# Patient Record
Sex: Male | Born: 1950 | Race: White | Hispanic: No | Marital: Married | Smoking: Former smoker
Health system: Southern US, Community
[De-identification: ages and names within clinical notes are randomized; demographics above are authoritative.]

## PROBLEM LIST (undated history)

## (undated) DIAGNOSIS — H938X1 Other specified disorders of right ear: Secondary | ICD-10-CM

## (undated) DIAGNOSIS — J309 Allergic rhinitis, unspecified: Secondary | ICD-10-CM

## (undated) DIAGNOSIS — H02231 Paralytic lagophthalmos right upper eyelid: Secondary | ICD-10-CM

## (undated) DIAGNOSIS — R894 Abnormal immunological findings in specimens from other organs, systems and tissues: Secondary | ICD-10-CM

## (undated) DIAGNOSIS — E785 Hyperlipidemia, unspecified: Secondary | ICD-10-CM

## (undated) DIAGNOSIS — K649 Unspecified hemorrhoids: Secondary | ICD-10-CM

## (undated) DIAGNOSIS — R1312 Dysphagia, oropharyngeal phase: Secondary | ICD-10-CM

## (undated) DIAGNOSIS — D131 Benign neoplasm of stomach: Secondary | ICD-10-CM

## (undated) DIAGNOSIS — G519 Disorder of facial nerve, unspecified: Principal | ICD-10-CM

## (undated) DIAGNOSIS — C07 Malignant neoplasm of parotid gland: Secondary | ICD-10-CM

## (undated) DIAGNOSIS — K2980 Duodenitis without bleeding: Secondary | ICD-10-CM

## (undated) DIAGNOSIS — Z833 Family history of diabetes mellitus: Secondary | ICD-10-CM

## (undated) DIAGNOSIS — G510 Bell's palsy: Secondary | ICD-10-CM

## (undated) DIAGNOSIS — H00019 Hordeolum externum unspecified eye, unspecified eyelid: Secondary | ICD-10-CM

## (undated) DIAGNOSIS — M25569 Pain in unspecified knee: Secondary | ICD-10-CM

## (undated) DIAGNOSIS — B159 Hepatitis A without hepatic coma: Secondary | ICD-10-CM

## (undated) DIAGNOSIS — K219 Gastro-esophageal reflux disease without esophagitis: Secondary | ICD-10-CM

## (undated) DIAGNOSIS — H903 Sensorineural hearing loss, bilateral: Secondary | ICD-10-CM

## (undated) DIAGNOSIS — C089 Malignant neoplasm of major salivary gland, unspecified: Secondary | ICD-10-CM

## (undated) DIAGNOSIS — M1990 Unspecified osteoarthritis, unspecified site: Secondary | ICD-10-CM

## (undated) DIAGNOSIS — T7840XA Allergy, unspecified, initial encounter: Secondary | ICD-10-CM

## (undated) HISTORY — DX: Unspecified osteoarthritis, unspecified site: M19.90

## (undated) HISTORY — DX: Benign neoplasm of stomach: D13.1

## (undated) HISTORY — DX: Bell's palsy: G51.0

## (undated) HISTORY — DX: Allergy, unspecified, initial encounter: T78.40XA

## (undated) HISTORY — DX: Unspecified hemorrhoids: K64.9

## (undated) HISTORY — DX: Disorder of facial nerve, unspecified: G51.9

## (undated) HISTORY — DX: Hepatitis a without hepatic coma: B15.9

## (undated) HISTORY — DX: Hyperlipidemia, unspecified: E78.5

## (undated) HISTORY — PX: WISDOM TOOTH EXTRACTION: SHX21

## (undated) HISTORY — DX: Allergic rhinitis, unspecified: J30.9

## (undated) HISTORY — PX: SALIVARY GLAND SURGERY: SHX768

## (undated) HISTORY — PX: NASAL SEPTUM SURGERY: SHX37

## (undated) HISTORY — DX: Duodenitis without bleeding: K29.80

## (undated) HISTORY — DX: Gastro-esophageal reflux disease without esophagitis: K21.9

## (undated) HISTORY — PX: OTHER SURGICAL HISTORY: SHX169

---
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
## 1977-12-24 HISTORY — PX: NASAL SEPTUM SURGERY: SHX37

## 1994-12-24 DIAGNOSIS — Z5189 Encounter for other specified aftercare: Secondary | ICD-10-CM

## 1994-12-24 HISTORY — PX: CHEST SURGERY: SHX595

## 1994-12-24 HISTORY — PX: OTHER SURGICAL HISTORY: SHX169

## 1994-12-24 HISTORY — DX: Encounter for other specified aftercare: Z51.89

## 1994-12-24 HISTORY — PX: HAND SURGERY: SHX662

## 2007-12-25 LAB — HM COLONOSCOPY

## 2008-11-26 LAB — HM COLONOSCOPY

## 2012-12-11 ENCOUNTER — Encounter: Payer: Self-pay | Admitting: Gastroenterology

## 2013-01-27 ENCOUNTER — Encounter: Payer: Self-pay | Admitting: Gastroenterology

## 2013-01-27 ENCOUNTER — Ambulatory Visit (INDEPENDENT_AMBULATORY_CARE_PROVIDER_SITE_OTHER): Payer: 59 | Admitting: Gastroenterology

## 2013-01-27 VITALS — BP 110/88 | HR 84 | Ht 70.25 in | Wt 210.2 lb

## 2013-01-27 DIAGNOSIS — K3189 Other diseases of stomach and duodenum: Secondary | ICD-10-CM

## 2013-01-27 DIAGNOSIS — K219 Gastro-esophageal reflux disease without esophagitis: Secondary | ICD-10-CM

## 2013-01-27 DIAGNOSIS — R1013 Epigastric pain: Secondary | ICD-10-CM

## 2013-01-27 MED ORDER — OMEPRAZOLE 40 MG PO CPDR: 40.0000 mg | DELAYED_RELEASE_CAPSULE | Freq: Every day | ORAL | Status: DC

## 2013-01-27 NOTE — Progress Notes (Signed)
HPI: This is a  very pleasant 62 year old man whom I am meeting for the first time today.  EGD 11/2008 Dr. Sande Brothers in [place]: done for GERD, found duodenitis, mild gastritis, nodular mucosa in body; biopsies showed fundic gland polyps. Colonoscopy 2009: for screening; no polyps found, recommended repeat at 10 year interval.  Had 'bleeding ulcer' in stomach in 1994, required endoscopic treatment.  He tells me he gets an EGD about every 5 years 'just to check' for more ulcers.  Has been taking PPI for many years; currently taking omeprazole 20mg  once a day. This is not covering.  Pyrosis, pressure.  Needs to cover with Tums 2-3 times per week.  Usually takes omeprazole 15-20 min prior to BF meal. Has pyrosis at night often, laying down especially.  Laying down is worse for him.    Normally eats dinner, then has a snack   Non smoker, rare etoh. 2 mugs of coffee a day.  Moved from [place] in 2010 to be closer to her daughters.    Review of systems: Pertinent positive and negative review of systems were noted in the above HPI section. Complete review of systems was performed and was otherwise normal.    Past Medical History  Diagnosis Date  . GERD (gastroesophageal reflux disease)   . Hepatitis A   . HLD (hyperlipidemia)   . Hemorrhoids   . Duodenitis   . Fundic gland polyps of stomach, benign     Past Surgical History  Procedure Date  . Arm surgery     left, ligaments and tendon repair, accident  . Nasal septum surgery     Current Outpatient Prescriptions  Medication Sig Dispense Refill  . omeprazole (PRILOSEC) 20 MG capsule Take 20 mg by mouth daily.      . simvastatin (ZOCOR) 10 MG tablet Take 10 mg by mouth daily.        Allergies as of 01/27/2013  . (No Known Allergies)    Family History  Problem Relation Age of Onset  . Prostate cancer Father   . Colon polyps Mother   . Diabetes Mother   . Parkinson's disease Mother     History   Social History  .  Marital Status: Married    Spouse Name: N/A    Number of Children: 3  . Years of Education: N/A   Occupational History  . electrician    Social History Main Topics  . Smoking status: Former Smoker -- 5 years    Types: Cigarettes    Quit date: 12/24/1982  . Smokeless tobacco: Never Used  . Alcohol Use: Yes     Comment: social  . Drug Use: No  . Sexually Active: Not on file   Other Topics Concern  . Not on file   Social History Narrative  . No narrative on file       Physical Exam: BP 110/88  Pulse 84  Ht 5' 10.25" (1.784 m)  Wt 210 lb 4 oz (95.369 kg)  BMI 29.95 kg/m2 Constitutional: generally well-appearing Psychiatric: alert and oriented x3 Eyes: extraocular movements intact Mouth: oral pharynx moist, no lesions Neck: supple no lymphadenopathy Cardiovascular: heart regular rate and rhythm Lungs: clear to auscultation bilaterally Abdomen: soft, nontender, nondistended, no obvious ascites, no peritoneal signs, normal bowel sounds Extremities: no lower extremity edema bilaterally Skin: no lesions on visible extremities    Assessment and plan: 62 y.o. male with  routine risk for colon cancer, chronic GERD that is worsening despite his usual medicines.  We'll  proceed with EGD at his soonest convenience. I am changing his proton pump inhibitor around 240 mg daily omeprazole and he will consider adding H2 blocker at night if needed in one to 2 weeks. Will take over his colon cancer screening here with recall colonoscopy at 10 year interval from his first one.

## 2013-01-27 NOTE — Patient Instructions (Addendum)
You will be set up for an upper endoscopy for GERD, dyspepsia despite usual medicines. New script for omeprazole 40mg  once daily shortly before breakfast meal. If that doesn't help the PM symptoms then start taking Pepcid (or zantac) one pill at bedtime every night (particularly good for PM, laying down symptoms of acid). Recall colonoscopy for routine risk in December 2019.

## 2013-01-28 ENCOUNTER — Encounter: Payer: Self-pay | Admitting: Gastroenterology

## 2013-02-02 ENCOUNTER — Encounter: Payer: Self-pay | Admitting: Gastroenterology

## 2013-02-02 ENCOUNTER — Ambulatory Visit (AMBULATORY_SURGERY_CENTER): Payer: 59 | Admitting: Gastroenterology

## 2013-02-02 VITALS — BP 108/75 | HR 72 | Temp 97.5°F | Resp 18 | Ht 69.5 in | Wt 210.0 lb

## 2013-02-02 DIAGNOSIS — K219 Gastro-esophageal reflux disease without esophagitis: Secondary | ICD-10-CM

## 2013-02-02 DIAGNOSIS — K2970 Gastritis, unspecified, without bleeding: Secondary | ICD-10-CM

## 2013-02-02 DIAGNOSIS — K3189 Other diseases of stomach and duodenum: Secondary | ICD-10-CM

## 2013-02-02 DIAGNOSIS — K319 Disease of stomach and duodenum, unspecified: Secondary | ICD-10-CM

## 2013-02-02 MED ORDER — SODIUM CHLORIDE 0.9 % IV SOLN: 500.0000 mL | INTRAVENOUS | Status: DC

## 2013-02-02 NOTE — Op Note (Signed)
[facility] [address] [place], [address]   ENDOSCOPY PROCEDURE REPORT  PATIENT: Gregory Santiago, Gregory Santiago  MR#: [id number] BIRTHDATE: April 15, 1951 , 61  yrs. old GENDER: Male ENDOSCOPIST: Rachael Fee, MD PROCEDURE DATE:  02/02/2013 PROCEDURE:  EGD w/ biopsy ASA CLASS:     Class II INDICATIONS:  GERD, dyspepsia despite usual PPI. MEDICATIONS: Fentanyl 50 mcg IV, Versed 4 mg IV, and These medications were titrated to patient response per physician's verbal order TOPICAL ANESTHETIC: Cetacaine Spray  DESCRIPTION OF PROCEDURE: After the risks benefits and alternatives of the procedure were thoroughly explained, informed consent was obtained.  The LB GIF-H180 [id number] endoscope was introduced through the mouth and advanced to the second portion of the duodenum. Without limitations.  The instrument was slowly withdrawn as the mucosa was fully examined.    There was mild, non-specific distal gastritis.  Biopsies were taken and sent to pathology.  The examination was otherwise normal. Retroflexed views revealed no abnormalities.     The scope was then withdrawn from the patient and the procedure completed. COMPLICATIONS: There were no complications.  ENDOSCOPIC IMPRESSION: There was mild, non-specific distal gastritis, biopsied to check for H. pylori The examination was otherwise normal.  RECOMMENDATIONS: Await biopsy results   eSigned:  Rachael Fee, MD 02/02/2013 3:50 PM

## 2013-02-02 NOTE — Progress Notes (Signed)
Patient did not experience any of the following events: a burn prior to discharge; a fall within the facility; wrong site/side/patient/procedure/implant event; or a hospital transfer or hospital admission upon discharge from the facility. (G8907) Patient did not have preoperative order for IV antibiotic SSI prophylaxis. (G8918)  

## 2013-02-02 NOTE — Patient Instructions (Addendum)
YOU HAD AN ENDOSCOPIC PROCEDURE TODAY AT THE [facility]: Refer to the procedure report that was given to you for any specific questions about what was found during the examination.  If the procedure report does not answer your questions, please call your gastroenterologist to clarify.  If you requested that your care partner not be given the details of your procedure findings, then the procedure report has been included in a sealed envelope for you to review at your convenience later.  YOU SHOULD EXPECT: Some feelings of bloating in the abdomen. Passage of more gas than usual.  Walking can help get rid of the air that was put into your GI tract during the procedure and reduce the bloating. If you had a lower endoscopy (such as a colonoscopy or flexible sigmoidoscopy) you may notice spotting of blood in your stool or on the toilet paper. If you underwent a bowel prep for your procedure, then you may not have a normal bowel movement for a few days.  DIET: Your first meal following the procedure should be a light meal and then it is ok to progress to your normal diet.  A half-sandwich or bowl of soup is an example of a good first meal.  Heavy or fried foods are harder to digest and may make you feel nauseous or bloated.  Likewise meals heavy in dairy and vegetables can cause extra gas to form and this can also increase the bloating.  Drink plenty of fluids but you should avoid alcoholic beverages for 24 hours.  ACTIVITY: Your care partner should take you home directly after the procedure.  You should plan to take it easy, moving slowly for the rest of the day.  You can resume normal activity the day after the procedure however you should NOT DRIVE or use heavy machinery for 24 hours (because of the sedation medicines used during the test).    SYMPTOMS TO REPORT IMMEDIATELY: A gastroenterologist can be reached at any hour.  During normal business hours, 8:30 AM to 5:00 PM Monday through Friday,  call [phone].  After hours and on weekends, please call the GI answering service at [phone] who will take a message and have the physician on call contact you.    Following upper endoscopy (EGD)  Vomiting of blood or coffee ground material  New chest pain or pain under the shoulder blades  Painful or persistently difficult swallowing  New shortness of breath  Fever of 100F or higher  Black, tarry-looking stools  FOLLOW UP: If any biopsies were taken you will be contacted by phone or by letter within the next 1-3 weeks.  Call your gastroenterologist if you have not heard about the biopsies in 3 weeks.  Our staff will call the home number listed on your records the next business day following your procedure to check on you and address any questions or concerns that you may have at that time regarding the information given to you following your procedure. This is a courtesy call and so if there is no answer at the home number and we have not heard from you through the emergency physician on call, we will assume that you have returned to your regular daily activities without incident.  SIGNATURES/CONFIDENTIALITY: You and/or your care partner have signed paperwork which will be entered into your electronic medical record.  These signatures attest to the fact that that the information above on your After Visit Summary has been reviewed and is understood.  Full   responsibility of the confidentiality of this discharge information lies with you and/or your care-partner.  gastrititis information given.  Dr. Christella Hartigan will advise you of results of biopsy for H-pylori and if treatment is necessary.

## 2013-02-03 ENCOUNTER — Telehealth: Payer: Self-pay | Admitting: *Deleted

## 2013-02-03 NOTE — Telephone Encounter (Signed)
  Follow up Call-  Call back number 02/02/2013  Post procedure Call Back phone  # [phone]  Permission to leave phone message Yes     Patient questions:  Do you have a fever, pain , or abdominal swelling? no Pain Score  0 *  Have you tolerated food without any problems? yes  Have you been able to return to your normal activities? yes  Do you have any questions about your discharge instructions: Diet   no Medications  no Follow up visit  no  Do you have questions or concerns about your Care? no  Actions: * If pain score is 4 or above: No action needed, pain <4.

## 2013-02-11 ENCOUNTER — Encounter: Payer: Self-pay | Admitting: Gastroenterology

## 2013-10-29 ENCOUNTER — Other Ambulatory Visit: Payer: Self-pay

## 2013-11-28 ENCOUNTER — Emergency Department (HOSPITAL_BASED_OUTPATIENT_CLINIC_OR_DEPARTMENT_OTHER)
Admission: EM | Admit: 2013-11-28 | Discharge: 2013-11-28 | Disposition: A | Discharge status: Home / Self Care | Payer: 59 | Attending: Emergency Medicine | Admitting: Emergency Medicine

## 2013-11-28 ENCOUNTER — Encounter (HOSPITAL_BASED_OUTPATIENT_CLINIC_OR_DEPARTMENT_OTHER): Payer: Self-pay | Admitting: Emergency Medicine

## 2013-11-28 DIAGNOSIS — Z8619 Personal history of other infectious and parasitic diseases: Secondary | ICD-10-CM | POA: Insufficient documentation

## 2013-11-28 DIAGNOSIS — Z87891 Personal history of nicotine dependence: Secondary | ICD-10-CM | POA: Insufficient documentation

## 2013-11-28 DIAGNOSIS — Z23 Encounter for immunization: Secondary | ICD-10-CM | POA: Insufficient documentation

## 2013-11-28 DIAGNOSIS — Z8679 Personal history of other diseases of the circulatory system: Secondary | ICD-10-CM | POA: Insufficient documentation

## 2013-11-28 DIAGNOSIS — E785 Hyperlipidemia, unspecified: Secondary | ICD-10-CM | POA: Insufficient documentation

## 2013-11-28 DIAGNOSIS — Y929 Unspecified place or not applicable: Secondary | ICD-10-CM | POA: Insufficient documentation

## 2013-11-28 DIAGNOSIS — S61209A Unspecified open wound of unspecified finger without damage to nail, initial encounter: Secondary | ICD-10-CM | POA: Insufficient documentation

## 2013-11-28 DIAGNOSIS — Y939 Activity, unspecified: Secondary | ICD-10-CM | POA: Insufficient documentation

## 2013-11-28 DIAGNOSIS — S61012A Laceration without foreign body of left thumb without damage to nail, initial encounter: Secondary | ICD-10-CM

## 2013-11-28 DIAGNOSIS — K219 Gastro-esophageal reflux disease without esophagitis: Secondary | ICD-10-CM | POA: Insufficient documentation

## 2013-11-28 DIAGNOSIS — W298XXA Contact with other powered powered hand tools and household machinery, initial encounter: Secondary | ICD-10-CM | POA: Insufficient documentation

## 2013-11-28 DIAGNOSIS — Z79899 Other long term (current) drug therapy: Secondary | ICD-10-CM | POA: Insufficient documentation

## 2013-11-28 MED ORDER — CEPHALEXIN 250 MG PO CAPS
1000.0000 mg | ORAL_CAPSULE | Freq: Once | ORAL | Status: AC
  Administered 2013-11-28: 1000 mg via ORAL
  Filled 2013-11-28: qty 4

## 2013-11-28 MED ORDER — TETANUS-DIPHTH-ACELL PERTUSSIS 5-2.5-18.5 LF-MCG/0.5 IM SUSP
0.5000 mL | Freq: Once | INTRAMUSCULAR | Status: AC
  Administered 2013-11-28: 0.5 mL via INTRAMUSCULAR
  Filled 2013-11-28: qty 0.5

## 2013-11-28 NOTE — ED Provider Notes (Signed)
CSN: [id number]     Arrival date & time 11/28/13  2111 History  This chart was scribed for Hanley Seamen, MD by Bennett Scrape, ED Scribe. This patient was seen in room MH09/MH09 and the patient's care was started at 10:53 PM.   Chief Complaint  Patient presents with  . Laceration   The history is provided by the patient. No language interpreter was used.    HPI Comments: Catalino Plascencia is a 62 y.o. male who presents to the Emergency Department complaining of a laceration to the left thumb. Pt states that he cut it with a circular saw around 5 PM tonight. He bandaged the area and continued working. He reports that he is here currently due to his wife's concerns over the injury. The bleeding is controlled currently and he denies numbness or weakness of the left thumb. He denies any other complaints. TD vaccine is UTD. Pain is minimal.   Past Medical History  Diagnosis Date  . GERD (gastroesophageal reflux disease)   . Hepatitis A   . HLD (hyperlipidemia)   . Hemorrhoids   . Duodenitis   . Fundic gland polyps of stomach, benign    Past Surgical History  Procedure Laterality Date  . Arm surgery      left, ligaments and tendon repair, accident  . Nasal septum surgery     Family History  Problem Relation Age of Onset  . Prostate cancer Father   . Colon polyps Mother   . Diabetes Mother   . Parkinson's disease Mother    History  Substance Use Topics  . Smoking status: Former Smoker -- 5 years    Types: Cigarettes    Quit date: 12/24/1982  . Smokeless tobacco: Never Used  . Alcohol Use: Yes     Comment: social    Review of Systems  A complete 10 system review of systems was obtained and all systems are negative except as noted in the HPI and PMH.   Allergies  Review of patient's allergies indicates no known allergies.  Home Medications   Current Outpatient Rx  Name  Route  Sig  Dispense  Refill  . omeprazole (PRILOSEC) 40 MG capsule   Oral   Take 1 capsule (40 mg  total) by mouth daily.   30 capsule   11   . simvastatin (ZOCOR) 10 MG tablet   Oral   Take 10 mg by mouth daily.          Triage Vitals: BP 127/86  Pulse 74  Temp(Src) 98.4 F (36.9 C) (Oral)  Resp 16  Ht 5\' 11"  (1.803 m)  Wt 200 lb (90.719 kg)  BMI 27.91 kg/m2  SpO2 98%  Physical Exam  Nursing note and vitals reviewed.  General: Well-developed, well-nourished male in no acute distress; appearance consistent with age of record HENT: normocephalic; atraumatic Eyes: pupils equal, round and reactive to light; extraocular muscles intact Neck: supple Heart: regular rate and rhythm; no murmurs, rubs or gallops Lungs: clear to auscultation bilaterally Abdomen: soft; nondistended; nontender; no masses or hepatosplenomegaly; bowel sounds present Extremities: 1.7 cm laceration to the medial aspect of the left thumb proximal phalanx. It is at angle and does not penetrate the full thickness of the skin. There are no functional or sensory deficits associated with it, full range of motion; pulses normal, no edema, no deformity Neurologic: Awake, alert and oriented; motor function intact in all extremities and symmetric; no facial droop Skin: Warm and dry Psychiatric: Normal mood and affect  ED Course  Procedures (including critical care time)  DIAGNOSTIC STUDIES: Oxygen Saturation is 98% on room air, normal by my interpretation.    COORDINATION OF CARE: 10:57 PM-Discussed treatment plan which includes laceration repair with pt at bedside and pt agreed to plan. Will give 1 g of peri-procedural Keflex.   LACERATION REPAIR PROCEDURE NOTE The patient's identification was confirmed and consent was obtained. This procedure was performed by Hanley Seamen, MD at 10:59 PM. Site: medial aspect of the left thumb Sterile procedures observed Anesthetic used (type and amt): 1.5 mL of 2% lidocaine without epi Suture type/size: 5-0 Nylon Length: 1.7 cm # of Sutures: 3 Technique:  Interrupted Complexity: Simple Antibx ointment applied Tetanus UTD Site anesthetized, irrigated with NS, explored without evidence of foreign body, wound well approximated, site covered with dry, sterile dressing.  Patient tolerated procedure well without complications. Instructions for care discussed verbally and patient provided with additional written instructions for homecare and f/u.   MDM   1. Laceration of left thumb without complication    I personally performed the services described in this documentation, which was scribed in my presence.  The recorded information has been reviewed and is accurate.      Hanley Seamen, MD 11/28/13 [phone]

## 2013-11-28 NOTE — ED Notes (Signed)
Laceration left thumb webbing caused by saw

## 2013-11-28 NOTE — ED Notes (Signed)
I have patient soaking thumb in 50/50 solution of saline and iodine, patient waiting/ready for procedure.

## 2013-11-28 NOTE — ED Notes (Signed)
Wound left thumb webbing bleeding control DSD applied after cleaning with NSS

## 2013-11-28 NOTE — ED Notes (Signed)
D/c with family- no rx given

## 2013-12-08 ENCOUNTER — Ambulatory Visit: Payer: 59 | Admitting: Internal Medicine

## 2013-12-09 ENCOUNTER — Ambulatory Visit (INDEPENDENT_AMBULATORY_CARE_PROVIDER_SITE_OTHER): Payer: 59 | Admitting: Internal Medicine

## 2013-12-09 ENCOUNTER — Encounter: Payer: Self-pay | Admitting: Internal Medicine

## 2013-12-09 VITALS — BP 128/84 | HR 72 | Temp 98.1°F | Ht 71.0 in | Wt 205.0 lb

## 2013-12-09 DIAGNOSIS — Z23 Encounter for immunization: Secondary | ICD-10-CM

## 2013-12-09 DIAGNOSIS — E785 Hyperlipidemia, unspecified: Secondary | ICD-10-CM

## 2013-12-09 DIAGNOSIS — S61012S Laceration without foreign body of left thumb without damage to nail, sequela: Secondary | ICD-10-CM

## 2013-12-09 DIAGNOSIS — L089 Local infection of the skin and subcutaneous tissue, unspecified: Secondary | ICD-10-CM

## 2013-12-09 DIAGNOSIS — IMO0002 Reserved for concepts with insufficient information to code with codable children: Secondary | ICD-10-CM

## 2013-12-09 DIAGNOSIS — K219 Gastro-esophageal reflux disease without esophagitis: Secondary | ICD-10-CM | POA: Insufficient documentation

## 2013-12-09 MED ORDER — SULFAMETHOXAZOLE-TRIMETHOPRIM 800-160 MG PO TABS: 1.0000 | ORAL_TABLET | Freq: Two times a day (BID) | ORAL | Status: DC

## 2013-12-09 NOTE — Progress Notes (Signed)
Pre-visit discussion using our clinic review tool. No additional management support is needed unless otherwise documented below in the visit note.  

## 2013-12-09 NOTE — Assessment & Plan Note (Signed)
On statin Check lipids and adjust as needed -scheduled for February 2015

## 2013-12-09 NOTE — Assessment & Plan Note (Signed)
Prior GI evaluation 2012 reviewed Symptoms controlled with once daily PPI The current medical regimen is effective;  continue present plan and medications.

## 2013-12-09 NOTE — Progress Notes (Signed)
   Subjective:    Patient ID: Gregory Santiago, male    DOB: 01/04/1951, 62 y.o.   MRN: [id number]  Suture / Staple Removal   New patient to me - here to establish care - CPX scheduled for 01/2014 Here for suture removal - laceration to L thumb base 11/28/13 - seen in ER - sutures placed In past 2 days, increasing erythema with redness and drainage  Also reviewed chronic medical issues: Allergic rhinitis. Using over-the-counter nasal steroid and antihistamine for treatment of same. Denies seasonal flare, worse since moving to [place] from [place]. No sinus pressure or fever  Dyslipidemia. On statin. Due for annual labs in the spring. Denies changes of medication dose and her reports compliance as prescribed.  GERD. On PPI. Denies abdominal pain, nausea, unexpected weight loss or bowel change  Past Medical History  Diagnosis Date  . GERD (gastroesophageal reflux disease)   . Hepatitis A   . HLD (hyperlipidemia)   . Hemorrhoids   . Duodenitis   . Fundic gland polyps of stomach, benign   . Headaches, cluster     Review of Systems  Constitutional: Negative for fever, fatigue and unexpected weight change.  Respiratory: Negative for cough and shortness of breath.   Cardiovascular: Negative for chest pain and leg swelling.  Musculoskeletal: Negative for arthralgias and joint swelling.       Objective:   Physical Exam BP 128/84  Pulse 72  Temp(Src) 98.1 F (36.7 C) (Oral)  Ht 5\' 11"  (1.803 m)  Wt 205 lb (92.987 kg)  BMI 28.60 kg/m2  SpO2 95% Wt Readings from Last 3 Encounters:  12/09/13 205 lb (92.987 kg)  11/28/13 200 lb (90.719 kg)  02/02/13 210 lb (95.255 kg)   Constitutional: he appears well-developed and well-nourished. No distress.  Neck: Normal range of motion. Neck supple. No JVD present. No thyromegaly present.  Cardiovascular: Normal rate, regular rhythm and normal heart sounds.  No murmur heard. No BLE edema. Pulmonary/Chest: Effort normal and breath sounds  normal. No respiratory distress. he has no wheezes.  Musculoskeletal: left thumb with mild erythema and white/bruised discolration at sutures (3 across 2cm laceration at lateral edge of thumb base) - no purulence expressed and sutures removed without problem. Thumb with normal range of motion, no joint effusions. No gross deformities - Neurovascular intact distally Skin: L thumb laceration - see MSkel above -remaining skin is warm and dry. No rash noted. No erythema.  Psychiatric: he has a normal mood and affect. behavior is normal. Judgment and thought content normal.  No results found for this basename: WBC, HGB, HCT, PLT, GLUCOSE, CHOL, TRIG, HDL, LDLDIRECT, LDLCALC, ALT, AST, NA, K, CL, CREATININE, BUN, CO2, TSH, PSA, INR, GLUF, HGBA1C, MICROALBUR       Assessment & Plan:   Laceration left thumb with early infection  Sutures removed today Septra twice daily for one week Soaking and wound care instructions provided Pt agrees to call if symptoms worse or unimproved with conservative therapy for referral to hand as needed

## 2013-12-09 NOTE — Patient Instructions (Addendum)
It was good to see you today.  We have removed sutures from your thumb today  Septra antibiotics 2x/day x 1 week - Your prescription(s) have been submitted to your pharmacy. Please take as directed and contact our office if you believe you are having problem(s) with the medication(s).  Continue to soak 3x/day for 5-8 minutes in warm soapy watery for next several days- call if increase in red, pain and drainage or if any fever  Hepatitis B vaccination #1 of 3 given today  Keep follow up as planned for annual exam and labs, please call sooner if any problems  Wound Infection A wound infection happens when a type of germ (bacteria) grows in a wound. Caring for the infection can help the wound heal. Wound infections need treatment. HOME CARE   Only take medicine as told by your doctor.  Take your antibiotic medicine as told. Finish it even if you start to feel better.  Clean the wound with mild soap and water as told. Rinse the soap off. Pat the area dry with a clean towel. Do not rub the wound.  Change any bandages (dressings) as told by your doctor.  Put cream and a bandage on the wound as told by your doctor.  If the bandage sticks, wet it with soapy water to remove the bandage.  Change the bandage if it gets wet, dirty, or starts to smell.  Take showers. Do not take baths, swim, or do anything that puts your wound under water.  Avoid exercise that makes you sweat.  If your wound itches, use a medicine that helps stop itching. Do not pick or scratch at the wound.  Keep all doctor visits as told. GET HELP RIGHT AWAY IF:   You have more puffiness (swelling), pain, or redness around the wound.  You have more yellowish-white fluid (pus) coming from the wound.  You have a bad smell coming from the wound.  Your wound breaks open more.  You have a fever. MAKE SURE YOU:   Understand these instructions.  Will watch your condition.  Will get help right away if you are not  doing well or get worse. Document Released: 09/18/2008 Document Revised: 03/03/2012 Document Reviewed: 05/21/2011 [facility] Patient Information 2014 [place], [place].

## 2014-01-29 ENCOUNTER — Encounter: Payer: Self-pay | Admitting: Internal Medicine

## 2014-01-29 ENCOUNTER — Ambulatory Visit (INDEPENDENT_AMBULATORY_CARE_PROVIDER_SITE_OTHER): Payer: 59 | Admitting: Internal Medicine

## 2014-01-29 ENCOUNTER — Ambulatory Visit (INDEPENDENT_AMBULATORY_CARE_PROVIDER_SITE_OTHER): Payer: 59

## 2014-01-29 VITALS — BP 120/82 | HR 64 | Temp 98.4°F | Ht 71.0 in | Wt 202.0 lb

## 2014-01-29 DIAGNOSIS — Z0000 Encounter for general adult medical examination without abnormal findings: Secondary | ICD-10-CM

## 2014-01-29 DIAGNOSIS — H6122 Impacted cerumen, left ear: Secondary | ICD-10-CM

## 2014-01-29 DIAGNOSIS — Z2911 Encounter for prophylactic immunotherapy for respiratory syncytial virus (RSV): Secondary | ICD-10-CM

## 2014-01-29 DIAGNOSIS — K219 Gastro-esophageal reflux disease without esophagitis: Secondary | ICD-10-CM

## 2014-01-29 DIAGNOSIS — Z23 Encounter for immunization: Secondary | ICD-10-CM

## 2014-01-29 DIAGNOSIS — H6120 Impacted cerumen, unspecified ear: Secondary | ICD-10-CM

## 2014-01-29 DIAGNOSIS — E785 Hyperlipidemia, unspecified: Secondary | ICD-10-CM

## 2014-01-29 LAB — CBC WITH DIFFERENTIAL/PLATELET
Basophils Absolute: 0 10*3/uL (ref 0.0–0.1)
Basophils Relative: 0.8 % (ref 0.0–3.0)
Eosinophils Absolute: 0.1 10*3/uL (ref 0.0–0.7)
Eosinophils Relative: 1.2 % (ref 0.0–5.0)
HCT: 43.9 % (ref 39.0–52.0)
Hemoglobin: 14.8 g/dL (ref 13.0–17.0)
Lymphocytes Relative: 20.6 % (ref 12.0–46.0)
Lymphs Abs: 1.3 10*3/uL (ref 0.7–4.0)
MCHC: 33.7 g/dL (ref 30.0–36.0)
MCV: 87.6 fl (ref 78.0–100.0)
Monocytes Absolute: 0.5 10*3/uL (ref 0.1–1.0)
Monocytes Relative: 7.7 % (ref 3.0–12.0)
Neutro Abs: 4.4 10*3/uL (ref 1.4–7.7)
Neutrophils Relative %: 69.7 % (ref 43.0–77.0)
Platelets: 272 10*3/uL (ref 150.0–400.0)
RBC: 5.01 Mil/uL (ref 4.22–5.81)
RDW: 12.9 % (ref 11.5–14.6)
WBC: 6.4 10*3/uL (ref 4.5–10.5)

## 2014-01-29 LAB — PSA: PSA: 0.03 ng/mL — ABNORMAL LOW (ref 0.10–4.00)

## 2014-01-29 LAB — URINALYSIS, ROUTINE W REFLEX MICROSCOPIC
Bilirubin Urine: NEGATIVE
Hgb urine dipstick: NEGATIVE
Ketones, ur: NEGATIVE
Leukocytes, UA: NEGATIVE
Nitrite: NEGATIVE
Specific Gravity, Urine: 1.02 (ref 1.000–1.030)
Total Protein, Urine: NEGATIVE
Urine Glucose: NEGATIVE
Urobilinogen, UA: 0.2 (ref 0.0–1.0)
pH: 6 (ref 5.0–8.0)

## 2014-01-29 LAB — TSH: TSH: 1.13 u[IU]/mL (ref 0.35–5.50)

## 2014-01-29 MED ORDER — SIMVASTATIN 10 MG PO TABS: 10.0000 mg | ORAL_TABLET | Freq: Every day | ORAL | Status: DC

## 2014-01-29 MED ORDER — CYCLOBENZAPRINE HCL 5 MG PO TABS: 5.0000 mg | ORAL_TABLET | Freq: Three times a day (TID) | ORAL | Status: DC | PRN

## 2014-01-29 MED ORDER — TRIAMCINOLONE ACETONIDE 55 MCG/ACT NA AERO: 2.0000 | INHALATION_SPRAY | Freq: Every day | NASAL | Status: DC

## 2014-01-29 MED ORDER — OMEPRAZOLE 40 MG PO CPDR: 40.0000 mg | DELAYED_RELEASE_CAPSULE | Freq: Every day | ORAL | Status: DC

## 2014-01-29 MED ORDER — HYDROCORTISONE 2.5 % RE CREA: 1.0000 "application " | TOPICAL_CREAM | Freq: Two times a day (BID) | RECTAL | Status: DC | PRN

## 2014-01-29 NOTE — Assessment & Plan Note (Signed)
On statin Check lipids annually and adjust as needed - The current medical regimen is effective;  continue present plan and medications.

## 2014-01-29 NOTE — Progress Notes (Signed)
Pre-visit discussion using our clinic review tool. No additional management support is needed unless otherwise documented below in the visit note.  

## 2014-01-29 NOTE — Assessment & Plan Note (Signed)
Prior GI evaluation 2012 reviewed - remote GU 2004 reviewed Symptoms controlled with once daily PPI The current medical regimen is effective;  continue present plan and medications.

## 2014-01-29 NOTE — Progress Notes (Signed)
Subjective:    Patient ID: Gregory Santiago, male    DOB: 04/22/1951, 63 y.o.   MRN: [id number]  HPI  "New" patient to me, here to establish primary care provider - (seen as acute 11/2013 for laceration) patient is here today for annual physical. Patient feels well and has no complaints.  Also reviewed chronic medical issues and interval medical events: Dyslipidemia, GERD, allergic rhinitis  Past Medical History  Diagnosis Date  . GERD (gastroesophageal reflux disease)   . Hepatitis A   . HLD (hyperlipidemia)   . Hemorrhoids   . Duodenitis   . Fundic gland polyps of stomach, benign   . Allergic rhinitis    Family History  Problem Relation Age of Onset  . Prostate cancer Father   . Arthritis Father   . Colon polyps Mother   . Diabetes Mother   . Parkinson's disease Mother   . Arthritis Mother    History  Substance Use Topics  . Smoking status: Former Smoker -- 5 years    Types: Cigarettes    Quit date: 12/24/1982  . Smokeless tobacco: Never Used  . Alcohol Use: Yes     Comment: social    Review of Systems  Constitutional: Negative for fever, activity change, appetite change, fatigue and unexpected weight change.  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 Negative for ear pain.   Respiratory: Negative for cough, chest tightness, shortness of breath and wheezing.   Cardiovascular: Negative for chest pain, palpitations and leg swelling.  Genitourinary: Negative for dysuria and difficulty urinating.  Neurological: Negative for dizziness, weakness and headaches.  Psychiatric/Behavioral: Negative for dysphoric mood. The patient is not nervous/anxious.   All other systems reviewed and are negative.       Objective:   Physical Exam  BP 120/82  Pulse 64  Temp(Src) 98.4 F (36.9 C) (Oral)  Ht 5\' 11"  (1.803 m)  Wt 202 lb (91.627 kg)  BMI 28.19 kg/m2  SpO2 97% Wt Readings from Last 3 Encounters:  01/29/14 202 lb (91.627 kg)  12/09/13  205 lb (92.987 kg)  11/28/13 200 lb (90.719 kg)   Constitutional: he is overweight, but appears well-developed and well-nourished. No distress.  HENT: Head: Normocephalic and atraumatic. Ears: L tympanic membrane initially of scared with soft cerumen, after irrigation, B TMs ok, no erythema or effusion; Nose: Nose normal. Mouth/Throat: Oropharynx is clear and moist. No oropharyngeal exudate.  Eyes: Conjunctivae and EOM are normal. Pupils are equal, round, and reactive to light. No scleral icterus.  Neck: Normal range of motion. Neck supple. No JVD present. No thyromegaly present.  Cardiovascular: Normal rate, regular rhythm and normal heart sounds.  No murmur heard. No BLE edema. Pulmonary/Chest: Effort normal and breath sounds normal. No respiratory distress. he has no wheezes.  Abdominal: Soft. Bowel sounds are normal. he exhibits no distension. There is no tenderness. no masses Rectal: good tone, smooth without hemorrhage or mass. Prostate firm, no nodules or irregularity -nontender -guaiac negative brown stool in vault Musculoskeletal: Normal range of motion, no joint effusions. No gross deformities Neurological: he is alert and oriented to person, place, and time. No cranial nerve deficit. Coordination, balance, strength, speech and gait are normal.  Skin: Skin is warm and dry. No rash noted. No erythema.  Psychiatric: he has a normal mood and affect. behavior is normal. Judgment and thought content normal.  No results found for this basename: WBC, HGB, HCT, PLT, GLUCOSE, CHOL, TRIG, HDL, LDLDIRECT, LDLCALC, ALT, AST,  NA, K, CL, CREATININE, BUN, CO2, TSH, PSA, INR, GLUF, HGBA1C, MICROALBUR   L cerumen impaction - Procedure: wax removal Reason: wax impaction Risks and benefits of procedure discussed with the patient who agrees to proceed. Ear(s) irrigated with warm water. Large amount of wax removed. Instrumentation with metal ear loop was performed to accomplish wax removal. the patient  tolerated procedure well.      Assessment & Plan:   CPX/v70.0 - Patient has been counseled on age-appropriate routine health concerns for screening and prevention. These are reviewed and up-to-date. Immunizations are up-to-date or declined. Labs ordered and reviewed.  Left ear cerumen impaction, irrigation as noted above today. Patient reports recurrent history of same, will call if needed for symptoms of decreased hearing or other problem  Problem List Items Addressed This Visit   GERD (gastroesophageal reflux disease)     Prior GI evaluation 2012 reviewed - remote GU 2004 reviewed Symptoms controlled with once daily PPI The current medical regimen is effective;  continue present plan and medications.     Relevant Medications      omeprazole (PRILOSEC) capsule   HLD (hyperlipidemia)     On statin Check lipids annually and adjust as needed - The current medical regimen is effective;  continue present plan and medications.     Relevant Medications      simvastatin (ZOCOR) tablet    Other Visit Diagnoses   Routine general medical examination at a health care facility    -  Primary    Relevant Orders       TSH       Lipid panel       Hepatic function panel       Basic metabolic panel       CBC with Differential       Urinalysis, Routine w reflex microscopic       PSA    Impacted cerumen of left ear

## 2014-01-29 NOTE — Patient Instructions (Addendum)
It was good to see you today.  We have reviewed your prior records including labs and tests today  Health Maintenance reviewed - Shingles vaccine and second hepatitis vaccine updated today -all other recommended immunizations and age-appropriate screenings are up-to-date.  Okay to drop off a copy of your colonoscopy report from [place] as discussed at your convenience  Test(s) ordered today. Your results will be released to [place] (or called to you) after review, usually within 72hours after test completion. If any changes need to be made, you will be notified at that same time.  Medications reviewed and updated, no changes recommended at this time. Refill on medication(s) as discussed today.  Your ears have been irrigated of wax today -let us know if continued hearing problems persist for referral to audiologist and hearing testing  Please schedule followup in 12 months for annual exam and labs, call sooner if problems.  Health Maintenance, Males A healthy lifestyle and preventative care can promote health and wellness.  Maintain regular health, dental, and eye exams.  Eat a healthy diet. Foods like vegetables, fruits, whole grains, low-fat dairy products, and lean protein foods contain the nutrients you need and are low in calories. Decrease your intake of foods high in solid fats, added sugars, and salt. Get information about a proper diet from your health care provider, if necessary.  Regular physical exercise is one of the most important things you can do for your health. Most adults should get at least 150 minutes of moderate-intensity exercise (any activity that increases your heart rate and causes you to sweat) each week. In addition, most adults need muscle-strengthening exercises on 2 or more days a week.   Maintain a healthy weight. The body mass index (BMI) is a screening tool to identify possible weight problems. It provides an estimate of body fat based on height and  weight. Your health care provider can find your BMI and can help you achieve or maintain a healthy weight. For males 20 years and older:  A BMI below 18.5 is considered underweight.  A BMI of 18.5 to 24.9 is normal.  A BMI of 25 to 29.9 is considered overweight.  A BMI of 30 and above is considered obese.  Maintain normal blood lipids and cholesterol by exercising and minimizing your intake of saturated fat. Eat a balanced diet with plenty of fruits and vegetables. Blood tests for lipids and cholesterol should begin at age 52 and be repeated every 5 years. If your lipid or cholesterol levels are high, you are over 50, or you are at high risk for heart disease, you may need your cholesterol levels checked more frequently.Ongoing high lipid and cholesterol levels should be treated with medicines, if diet and exercise are not working.  If you smoke, find out from your health care provider how to quit. If you do not use tobacco, do not start.  Lung cancer screening is recommended for adults aged 15 80 years who are at high risk for developing lung cancer because of a history of smoking. A yearly low-dose CT scan of the lungs is recommended for people who have at least a 30-pack-year history of smoking and are a current smoker or have quit within the past 15 years. A pack year of smoking is smoking an average of 1 pack of cigarettes a day for 1 year (for example, a 30-pack-year history of smoking could mean smoking 1 pack a day for 30 years or 2 packs a day for 15 years). Yearly  screening should continue until the smoker has stopped smoking for at least 15 years. Yearly screening should be stopped for people who develop a health problem that would prevent them from having lung cancer treatment.  If you choose to drink alcohol, do not have more than 2 drinks per day. One drink is considered to be 12 oz (360 mL) of beer, 5 oz (150 mL) of wine, or 1.5 oz (45 mL) of liquor.  Avoid use of street drugs. Do  not share needles with anyone. Ask for help if you need support or instructions about stopping the use of drugs.  High blood pressure causes heart disease and increases the risk of stroke. Blood pressure should be checked at least every 1 2 years. Ongoing high blood pressure should be treated with medicines if weight loss and exercise are not effective.  If you are 66 62 years old, ask your health care provider if you should take aspirin to prevent heart disease.  Diabetes screening involves taking a blood sample to check your fasting blood sugar level. This should be done once every 3 years after age 70, if you are at a normal weight and without risk factors for diabetes. Testing should be considered at a younger age or be carried out more frequently if you are overweight and have at least 1 risk factor for diabetes.  Colorectal cancer can be detected and often prevented. Most routine colorectal cancer screening begins at the age of 41 and continues through age 69. However, your health care provider may recommend screening at an earlier age if you have risk factors for colon cancer. On a yearly basis, your health care provider may provide home test kits to check for hidden blood in the stool. A small camera at the end of a tube may be used to directly examine the colon (sigmoidoscopy or colonoscopy) to detect the earliest forms of colorectal cancer. Talk to your health care provider about this at age 45, when routine screening begins. A direct exam of the colon should be repeated every 5 10 years through age 25, unless early forms of pre-cancerous polyps or small growths are found.  People who are at an increased risk for hepatitis B should be screened for this virus. You are considered at high risk for hepatitis B if:  You were born in a country where hepatitis B occurs often. Talk with your health care provider about which countries are considered high-risk.  Your parents were born in a high-risk  country and you have not received a shot to protect against hepatitis B (hepatitis B vaccine).  You have HIV or AIDS.  You use needles to inject street drugs.  You live with, or have sex with, someone who has hepatitis B.  You are a man who has sex with other men (MSM).  You get hemodialysis treatment.  You take certain medicines for conditions like cancer, organ transplantation, and autoimmune conditions.  Hepatitis C blood testing is recommended for all people born from [birth year] through 1964 and any individual with known risk factors for hepatitis C.  Healthy men should no longer receive prostate-specific antigen (PSA) blood tests as part of routine cancer screening. Talk to your health care provider about prostate cancer screening.  Testicular cancer screening is not recommended for adolescents or adult males who have no symptoms. Screening includes self-exam, a health care provider exam, and other screening tests. Consult with your health care provider about any symptoms you have or any concerns you have  about testicular cancer.  Practice safe sex. Use condoms and avoid high-risk sexual practices to reduce the spread of sexually transmitted infections (STIs).  Use sunscreen. Apply sunscreen liberally and repeatedly throughout the day. You should seek shade when your shadow is shorter than you. Protect yourself by wearing long sleeves, pants, a wide-brimmed hat, and sunglasses year round, whenever you are outdoors.  Tell your health care provider of new moles or changes in moles, especially if there is a change in shape or color. Also tell your provider if a mole is larger than the size of a pencil eraser.  A one-time screening for abdominal aortic aneurysm (AAA) and surgical repair of large AAAs by ultrasound is recommended for men aged 62 75 years who are current or former smokers.  Stay current with your vaccines (immunizations). Document Released: 06/07/2008 Document Revised:  09/30/2013 Document Reviewed: 05/07/2011 [facility] Patient Information 2014 [place], [place]. Cerumen Plug A cerumen plug is having too much wax in your ear canal. The outer ear canal is lined with hairs and glands that secrete wax. This wax is called cerumen. This protects the ear canal. It also helps prevent material from entering the ear. Too much wax can cause a feeling of fullness in the ears, decreased hearing, ringing in the ears, or an earache. Sometimes your caregiver will remove a cerumen plug with an instrument called a curette. Or he/she may flush the ear canal with warm water from a syringe to remove the wax. You may simply be sent home to follow the home care instructions below for wax removal. Generally ear wax does not have to be removed unless it is causing a problem such as one of those listed above. When too much wax is causing a problem, the following are a few home remedies which can be used to help this problem. HOME CARE INSTRUCTIONS   Put a couple drops of glycerin, baby oil, or mineral oil in the ear a couple times of day. Do this every day for several days. After putting the drops in, you will need to lay with the affected ear pointing up for a couple minutes. This allows the drops to remain in the canal and run down to the area of wax blockage. This will soften the wax plug. It may also make your hearing worse as the wax softens and blocks the canal even more.  After a couple days, you may gently flush the ear canal with warm water from a syringe. Do this by pulling your ear up and back with your head tilted slightly forward and towards a pan to catch the water. This is most easily done with a helper. You can also accomplish the same thing by letting the shower beat into your ear canal to wash the wax out. Sometimes this will not be immediately successful. You will have to return to the first step of using the oil to further soften the wax. Then resume washing the ear canal out with  a syringe or shower.  Following removal of the wax, put ten to twenty drops of rubbing alcohol into the outer ears. This will dry the canal and prevent an infection.  Do not irrigate or wash out your ears if you have had a perforated ear drum or mastoid surgery. SEEK IMMEDIATE MEDICAL CARE IF:   You are unsuccessful with the above instructions for home care.  You develop ear pain or drainage from the ear. MAKE SURE YOU:   Understand these instructions.  Will watch  your condition.  Will get help right away if you are not doing well or get worse. Document Released: 09/04/2001 Document Revised: 03/03/2012 Document Reviewed: 12/01/2008 [facility] Patient Information 2014 [place], [place].

## 2014-02-01 LAB — HEPATIC FUNCTION PANEL
ALT: 26 U/L (ref 0–53)
AST: 22 U/L (ref 0–37)
Albumin: 4.5 g/dL (ref 3.5–5.2)
Alkaline Phosphatase: 72 U/L (ref 39–117)
Bilirubin, Direct: 0.1 mg/dL (ref 0.0–0.3)
Total Bilirubin: 0.6 mg/dL (ref 0.3–1.2)
Total Protein: 7.2 g/dL (ref 6.0–8.3)

## 2014-02-01 LAB — BASIC METABOLIC PANEL
BUN: 17 mg/dL (ref 6–23)
CO2: 29 mEq/L (ref 19–32)
Calcium: 9.9 mg/dL (ref 8.4–10.5)
Chloride: 106 mEq/L (ref 96–112)
Creatinine, Ser: 0.9 mg/dL (ref 0.4–1.5)
GFR: 92.26 mL/min (ref >60.00)
Glucose, Bld: 96 mg/dL (ref 70–99)
Potassium: 4.8 mEq/L (ref 3.5–5.1)
Sodium: 139 mEq/L (ref 135–145)

## 2014-02-01 LAB — LIPID PANEL
Cholesterol: 213 mg/dL — ABNORMAL HIGH (ref 0–200)
HDL: 39.8 mg/dL (ref >39.00)
Total CHOL/HDL Ratio: 5
Triglycerides: 149 mg/dL (ref 0.0–149.0)
VLDL: 29.8 mg/dL (ref 0.0–40.0)

## 2014-02-01 LAB — LDL CHOLESTEROL, DIRECT: Direct LDL: 155.9 mg/dL

## 2014-06-10 ENCOUNTER — Ambulatory Visit (INDEPENDENT_AMBULATORY_CARE_PROVIDER_SITE_OTHER): Payer: 59 | Admitting: *Deleted

## 2014-06-10 ENCOUNTER — Encounter: Payer: Self-pay | Admitting: *Deleted

## 2014-06-10 DIAGNOSIS — Z23 Encounter for immunization: Secondary | ICD-10-CM

## 2014-11-01 ENCOUNTER — Other Ambulatory Visit: Payer: Self-pay | Admitting: Internal Medicine

## 2015-01-09 ENCOUNTER — Encounter (HOSPITAL_BASED_OUTPATIENT_CLINIC_OR_DEPARTMENT_OTHER): Payer: Self-pay | Admitting: *Deleted

## 2015-01-09 ENCOUNTER — Emergency Department (HOSPITAL_BASED_OUTPATIENT_CLINIC_OR_DEPARTMENT_OTHER)
Admission: EM | Admit: 2015-01-09 | Discharge: 2015-01-09 | Disposition: A | Discharge status: Home / Self Care | Payer: 59 | Attending: Emergency Medicine | Admitting: Emergency Medicine

## 2015-01-09 ENCOUNTER — Emergency Department (HOSPITAL_BASED_OUTPATIENT_CLINIC_OR_DEPARTMENT_OTHER): Payer: 59

## 2015-01-09 DIAGNOSIS — S43101A Unspecified dislocation of right acromioclavicular joint, initial encounter: Secondary | ICD-10-CM

## 2015-01-09 DIAGNOSIS — S2241XA Multiple fractures of ribs, right side, initial encounter for closed fracture: Secondary | ICD-10-CM | POA: Insufficient documentation

## 2015-01-09 DIAGNOSIS — Y9289 Other specified places as the place of occurrence of the external cause: Secondary | ICD-10-CM | POA: Diagnosis not present

## 2015-01-09 DIAGNOSIS — Z8709 Personal history of other diseases of the respiratory system: Secondary | ICD-10-CM | POA: Insufficient documentation

## 2015-01-09 DIAGNOSIS — Y998 Other external cause status: Secondary | ICD-10-CM | POA: Diagnosis not present

## 2015-01-09 DIAGNOSIS — Z87891 Personal history of nicotine dependence: Secondary | ICD-10-CM | POA: Insufficient documentation

## 2015-01-09 DIAGNOSIS — K219 Gastro-esophageal reflux disease without esophagitis: Secondary | ICD-10-CM | POA: Diagnosis not present

## 2015-01-09 DIAGNOSIS — Z7951 Long term (current) use of inhaled steroids: Secondary | ICD-10-CM | POA: Insufficient documentation

## 2015-01-09 DIAGNOSIS — Z8619 Personal history of other infectious and parasitic diseases: Secondary | ICD-10-CM | POA: Insufficient documentation

## 2015-01-09 DIAGNOSIS — Y9389 Activity, other specified: Secondary | ICD-10-CM | POA: Diagnosis not present

## 2015-01-09 DIAGNOSIS — W12XXXA Fall on and from scaffolding, initial encounter: Secondary | ICD-10-CM | POA: Insufficient documentation

## 2015-01-09 DIAGNOSIS — S299XXA Unspecified injury of thorax, initial encounter: Secondary | ICD-10-CM | POA: Diagnosis present

## 2015-01-09 DIAGNOSIS — E785 Hyperlipidemia, unspecified: Secondary | ICD-10-CM | POA: Insufficient documentation

## 2015-01-09 DIAGNOSIS — Z79899 Other long term (current) drug therapy: Secondary | ICD-10-CM | POA: Diagnosis not present

## 2015-01-09 DIAGNOSIS — S2231XA Fracture of one rib, right side, initial encounter for closed fracture: Secondary | ICD-10-CM

## 2015-01-09 DIAGNOSIS — W19XXXA Unspecified fall, initial encounter: Secondary | ICD-10-CM

## 2015-01-09 LAB — CBC WITH DIFFERENTIAL/PLATELET
Band Neutrophils: 5 % (ref 0–10)
Basophils Absolute: 0 10*3/uL (ref 0.0–0.1)
Basophils Relative: 0 % (ref 0–1)
Eosinophils Absolute: 0.2 10*3/uL (ref 0.0–0.7)
Eosinophils Relative: 2 % (ref 0–5)
HCT: 41.9 % (ref 39.0–52.0)
Hemoglobin: 14.2 g/dL (ref 13.0–17.0)
Lymphocytes Relative: 29 % (ref 12–46)
Lymphs Abs: 2.5 10*3/uL (ref 0.7–4.0)
MCH: 28.9 pg (ref 26.0–34.0)
MCHC: 33.9 g/dL (ref 30.0–36.0)
MCV: 85.2 fL (ref 78.0–100.0)
Monocytes Absolute: 0.6 10*3/uL (ref 0.1–1.0)
Monocytes Relative: 7 % (ref 3–12)
Myelocytes: 1 %
Neutro Abs: 5.4 10*3/uL (ref 1.7–7.7)
Neutrophils Relative %: 56 % (ref 43–77)
Platelets: 334 10*3/uL (ref 150–400)
RBC: 4.92 MIL/uL (ref 4.22–5.81)
RDW: 11.8 % (ref 11.5–15.5)
WBC: 8.7 10*3/uL (ref 4.0–10.5)

## 2015-01-09 LAB — BASIC METABOLIC PANEL
Anion gap: 6 (ref 5–15)
BUN: 19 mg/dL (ref 6–23)
CO2: 26 mmol/L (ref 19–32)
Calcium: 8.9 mg/dL (ref 8.4–10.5)
Chloride: 104 mEq/L (ref 96–112)
Creatinine, Ser: 0.91 mg/dL (ref 0.50–1.35)
GFR calc Af Amer: >90 mL/min (ref >90)
GFR calc non Af Amer: 89 mL/min — ABNORMAL LOW (ref >90)
Glucose, Bld: 139 mg/dL — ABNORMAL HIGH (ref 70–99)
Potassium: 4 mmol/L (ref 3.5–5.1)
Sodium: 136 mmol/L (ref 135–145)

## 2015-01-09 MED ORDER — HYDROMORPHONE HCL 1 MG/ML IJ SOLN
1.0000 mg | Freq: Once | INTRAMUSCULAR | Status: AC
  Administered 2015-01-09: 1 mg via INTRAVENOUS
  Filled 2015-01-09: qty 1

## 2015-01-09 MED ORDER — OXYCODONE-ACETAMINOPHEN 5-325 MG PO TABS: 1.0000 | ORAL_TABLET | Freq: Four times a day (QID) | ORAL | Status: DC | PRN

## 2015-01-09 MED ORDER — ONDANSETRON HCL 4 MG/2ML IJ SOLN
4.0000 mg | Freq: Once | INTRAMUSCULAR | Status: AC
  Administered 2015-01-09: 4 mg via INTRAVENOUS
  Filled 2015-01-09: qty 2

## 2015-01-09 NOTE — Discharge Instructions (Signed)
Acromioclavicular Injuries °The AC (acromioclavicular) joint is the joint in the shoulder where the collarbone (clavicle) meets the shoulder blade (scapula). The part of the shoulder blade connected to the collarbone is called the acromion. Common problems with and treatments for the AC joint are detailed below. °ARTHRITIS °Arthritis occurs when the joint has been injured and the smooth padding between the joints (cartilage) is lost. This is the wear and tear seen in most joints of the body if they have been overused. This causes the joint to produce pain and swelling which is worse with activity.  °AC JOINT SEPARATION °AC joint separation means that the ligaments connecting the acromion of the shoulder blade and collarbone have been damaged, and the two bones no longer line up. AC separations can be anywhere from mild to severe, and are "graded" depending upon which ligaments are torn and how badly they are torn. °· Grade I Injury: the least damage is done, and the AC joint still lines up. °· Grade II Injury: damage to the ligaments which reinforce the AC joint. In a Grade II injury, these ligaments are stretched but not entirely torn. When stressed, the AC joint becomes painful and unstable. °· Grade III Injury: AC and secondary ligaments are completely torn, and the collarbone is no longer attached to the shoulder blade. This results in deformity; a prominence of the end of the clavicle. °AC JOINT FRACTURE °AC joint fracture means that there has been a break in the bones of the AC joint, usually the end of the clavicle. °TREATMENT °TREATMENT OF AC ARTHRITIS °· There is currently no way to replace the cartilage damaged by arthritis. The best way to improve the condition is to decrease the activities which aggravate the problem. Application of ice to the joint helps decrease pain and soreness (inflammation). The use of non-steroidal anti-inflammatory medication is helpful. °· If less conservative measures do not  work, then cortisone shots (injections) may be used. These are anti-inflammatories; they decrease the soreness in the joint and swelling. °· If non-surgical measures fail, surgery may be recommended. The procedure is generally removal of a portion of the end of the clavicle. This is the part of the collarbone closest to your acromion which is stabilized with ligaments to the acromion of the shoulder blade. This surgery may be performed using a tube-like instrument with a light (arthroscope) for looking into a joint. It may also be performed as an open surgery through a small incision by the surgeon. Most patients will have good range of motion within 6 weeks and may return to all activity including sports by 8-12 weeks, barring complications. °TREATMENT OF AN AC SEPARATION °· The initial treatment is to decrease pain. This is best accomplished by immobilizing the arm in a sling and placing an ice pack to the shoulder for 20 to 30 minutes every 2 hours as needed. As the pain starts to subside, it is important to begin moving the fingers, wrist, elbow and eventually the shoulder in order to prevent a stiff or "frozen" shoulder. Instruction on when and how much to move the shoulder will be provided by your caregiver. The length of time needed to regain full motion and function depends on the amount or grade of the injury. Recovery from a Grade I AC separation usually takes 10 to 14 days, whereas a Grade III may take 6 to 8 weeks. °· Grade I and II separations usually do not require surgery. Even Grade III injuries usually allow return to full   activity with few restrictions. Treatment is also based on the activity demands of the injured shoulder. For example, a high level quarterback with an injured throwing arm will receive more aggressive treatment than someone with a desk job who rarely uses his/her arm for strenuous activities. In some cases, a painful lump may persist which could require a later surgery. Surgery  can be very successful, but the benefits must be weighed against the potential risks. °TREATMENT OF AN AC JOINT FRACTURE °Fracture treatment depends on the type of fracture. Sometimes a splint or sling may be all that is required. Other times surgery may be required for repair. This is more frequently the case when the ligaments supporting the clavicle are completely torn. Your caregiver will help you with these decisions and together you can decide what will be the best treatment. °HOME CARE INSTRUCTIONS  °· Apply ice to the injury for 15-20 minutes each hour while awake for 2 days. Put the ice in a plastic bag and place a towel between the bag of ice and skin. °· If a sling has been applied, wear it constantly for as long as directed by your caregiver, even at night. The sling or splint can be removed for bathing or showering or as directed. Be sure to keep the shoulder in the same place as when the sling is on. Do not lift the arm. °· If a figure-of-eight splint has been applied it should be tightened gently by another person every day. Tighten it enough to keep the shoulders held back. Allow enough room to place the index finger between the body and strap. Loosen the splint immediately if there is numbness or tingling in the hands. °· Take over-the-counter or prescription medicines for pain, discomfort or fever as directed by your caregiver. °· If you or your child has received a follow up appointment, it is very important to keep that appointment in order to avoid long term complications, chronic pain or disability. °SEEK MEDICAL CARE IF:  °· The pain is not relieved with medications. °· There is increased swelling or discoloration that continues to get worse rather than better. °· You or your child has been unable to follow up as instructed. °· There is progressive numbness and tingling in the arm, forearm or hand. °SEEK IMMEDIATE MEDICAL CARE IF:  °· The arm is numb, cold or pale. °· There is increasing pain  in the hand, forearm or fingers. °MAKE SURE YOU:  °· Understand these instructions. °· Will watch your condition. °· Will get help right away if you are not doing well or get worse. °Document Released: 09/19/2005 Document Revised: 03/03/2012 Document Reviewed: 03/14/2009 °[employer]® Patient Information ©2015 [employer]. This information is not intended to replace advice given to you by your health care provider. Make sure you discuss any questions you have with your health care provider. °Rib Fracture °A rib fracture is a break or crack in one of the bones of the ribs. The ribs are a group of long, curved bones that wrap around your chest and attach to your spine. They protect your lungs and other organs in the chest cavity. A broken or cracked rib is often painful, but most do not cause other problems. Most rib fractures heal on their own over time. However, rib fractures can be more serious if multiple ribs are broken or if broken ribs move out of place and push against other structures. °CAUSES  °· A direct blow to the chest. For example, this could happen   during contact sports, a car accident, or a fall against a hard object. °· Repetitive movements with high force, such as pitching a baseball or having severe coughing spells. °SYMPTOMS  °· Pain when you breathe in or cough. °· Pain when someone presses on the injured area. °DIAGNOSIS  °Your caregiver will perform a physical exam. Various imaging tests may be ordered to confirm the diagnosis and to look for related injuries. These tests may include a chest X-ray, computed tomography (CT), magnetic resonance imaging (MRI), or a bone scan. °TREATMENT  °Rib fractures usually heal on their own in 1-3 months. The longer healing period is often associated with a continued cough or other aggravating activities. During the healing period, pain control is very important. Medication is usually given to control pain. Hospitalization or surgery may be needed for more  severe injuries, such as those in which multiple ribs are broken or the ribs have moved out of place.  °HOME CARE INSTRUCTIONS  °· Avoid strenuous activity and any activities or movements that cause pain. Be careful during activities and avoid bumping the injured rib. °· Gradually increase activity as directed by your caregiver. °· Only take over-the-counter or prescription medications as directed by your caregiver. Do not take other medications without asking your caregiver first. °· Apply ice to the injured area for the first 1-2 days after you have been treated or as directed by your caregiver. Applying ice helps to reduce inflammation and pain. °¨ Put ice in a plastic bag. °¨ Place a towel between your skin and the bag.   °¨ Leave the ice on for 15-20 minutes at a time, every 2 hours while you are awake. °· Perform deep breathing as directed by your caregiver. This will help prevent pneumonia, which is a common complication of a broken rib. Your caregiver may instruct you to: °¨ Take deep breaths several times a day. °¨ Try to cough several times a day, holding a pillow against the injured area. °¨ Use a device called an incentive spirometer to practice deep breathing several times a day. °· Drink enough fluids to keep your urine clear or pale yellow. This will help you avoid constipation.   °· Do not wear a rib belt or binder. These restrict breathing, which can lead to pneumonia.   °SEEK IMMEDIATE MEDICAL CARE IF:  °· You have a fever.   °· You have difficulty breathing or shortness of breath.   °· You develop a continual cough, or you cough up thick or bloody sputum. °· You feel sick to your stomach (nausea), throw up (vomit), or have abdominal pain.   °· You have worsening pain not controlled with medications.   °MAKE SURE YOU: °· Understand these instructions. °· Will watch your condition. °· Will get help right away if you are not doing well or get worse. °Document Released: 12/10/2005 Document Revised:  08/12/2013 Document Reviewed: 02/11/2013 °[employer]® Patient Information ©2015 [employer]. This information is not intended to replace advice given to you by your health care provider. Make sure you discuss any questions you have with your health care provider. ° °

## 2015-01-09 NOTE — ED Provider Notes (Signed)
CSN: [id number]     Arrival date & time 01/09/15  1336 History   First MD Initiated Contact with Patient 01/09/15 1346     Chief Complaint  Patient presents with  . Fall   Patient is a 64 y.o. male presenting with fall. The history is provided by the patient.  Fall This is a new problem. The current episode started 1 to 2 hours ago. The problem occurs constantly. The problem has not changed since onset.Associated symptoms include chest pain and shortness of breath. Pertinent negatives include no abdominal pain and no headaches. Exacerbated by: movement and breathing. Nothing relieves the symptoms. He has tried nothing for the symptoms.  Pt fell off a scaffold (6 feet) onto his right side.  He is having sharp pain in his ribs on the right posteriorly as well as his right shoulder.no abdominal pain. No head injury or loss of consciousness.no numbness or weakness.  Past Medical History  Diagnosis Date  . GERD (gastroesophageal reflux disease)   . Hepatitis A   . HLD (hyperlipidemia)   . Hemorrhoids   . Duodenitis   . Fundic gland polyps of stomach, benign   . Allergic rhinitis    Past Surgical History  Procedure Laterality Date  . Arm surgery      left, ligaments and tendon repair, accident  . Nasal septum surgery     Family History  Problem Relation Age of Onset  . Prostate cancer Father   . Arthritis Father   . Colon polyps Mother   . Diabetes Mother   . Parkinson's disease Mother   . Arthritis Mother    History  Substance Use Topics  . Smoking status: Former Smoker -- 5 years    Types: Cigarettes    Quit date: 12/24/1982  . Smokeless tobacco: Never Used  . Alcohol Use: Yes     Comment: social    Review of Systems  Respiratory: Positive for shortness of breath.   Cardiovascular: Positive for chest pain.  Gastrointestinal: Negative for abdominal pain.  Neurological: Negative for headaches.  All other systems reviewed and are negative.     Allergies  Review of  patient's allergies indicates no known allergies.  Home Medications   Prior to Admission medications   Medication Sig Start Date End Date Taking? Authorizing Provider  cyclobenzaprine (FLEXERIL) 5 MG tablet Take 1 tablet (5 mg total) by mouth 3 (three) times daily as needed for muscle spasms. 01/29/14  Yes Rowe Clack, MD  fluticasone (FLONASE) 50 MCG/ACT nasal spray Place 2 sprays into both nostrils daily.   Yes Historical Provider, MD  hydrocortisone (ANUSOL-HC) 2.5 % rectal cream Place 1 application rectally 2 (two) times daily as needed for hemorrhoids or itching. 01/29/14  Yes Rowe Clack, MD  omeprazole (PRILOSEC) 40 MG capsule Take 1 capsule by mouth  daily 11/01/14  Yes Rowe Clack, MD  simvastatin (ZOCOR) 10 MG tablet Take 1 tablet by mouth  daily 11/01/14  Yes Rowe Clack, MD  oxyCODONE-acetaminophen (PERCOCET/ROXICET) 5-325 MG per tablet Take 1-2 tablets by mouth every 6 (six) hours as needed. 01/09/15   Dorie Rank, MD  triamcinolone (NASACORT AQ) 55 MCG/ACT AERO nasal inhaler Place 2 sprays into the nose daily. 01/29/14   Rowe Clack, MD   BP 140/93 mmHg  Pulse 65  Temp(Src) 98 F (36.7 C) (Oral)  Resp 19  SpO2 96% Physical Exam  Constitutional: He appears well-developed and well-nourished. No distress.  HENT:  Head: Normocephalic and atraumatic.  Right Ear: External ear normal.  Left Ear: External ear normal.  Eyes: Conjunctivae are normal. Right eye exhibits no discharge. Left eye exhibits no discharge. No scleral icterus.  Neck: Neck supple. No tracheal deviation present.  Cardiovascular: Normal rate, regular rhythm and intact distal pulses.   Pulmonary/Chest: Effort normal. No stridor. No respiratory distress. He has decreased breath sounds (right side). He has no wheezes. He has no rales. He exhibits tenderness and bony tenderness.  ttp posterior right ribs, no crepitus  Abdominal: Soft. Bowel sounds are normal. He exhibits no distension. There  is no tenderness. There is no rebound and no guarding.  Musculoskeletal: He exhibits no edema.       Right shoulder: He exhibits tenderness and swelling (at ac joint). He exhibits no bony tenderness.       Left shoulder: Normal.       Right elbow: Normal.      Left elbow: Normal.       Right wrist: Normal.       Left wrist: Normal.       Right hip: Normal.       Left hip: Normal.       Cervical back: Normal.       Thoracic back: Normal.       Lumbar back: Normal.  Neurological: He is alert. He has normal strength. No cranial nerve deficit (no facial droop, extraocular movements intact, no slurred speech) or sensory deficit. He exhibits normal muscle tone. He displays no seizure activity. Coordination normal.  Skin: Skin is warm and dry. No rash noted.  Psychiatric: He has a normal mood and affect.  Nursing note and vitals reviewed.   ED Course  Procedures (including critical care time) Labs Review Labs Reviewed  BASIC METABOLIC PANEL - Abnormal; Notable for the following:    Glucose, Bld 139 (*)    GFR calc non Af Amer 89 (*)    All other components within normal limits  CBC WITH DIFFERENTIAL    Imaging Review Dg Ribs Unilateral W/chest Right  01/09/2015   CLINICAL DATA:  Fall from 6 feet today. Right posterior rib pain. Initial encounter.  EXAM: RIGHT RIBS AND CHEST - 3+ VIEW  COMPARISON:  05/29/2014  FINDINGS: There are fractures involving the right fourth through eighth ribs. Small amount of subcutaneous emphysema in the right chest wall without visible pneumothorax. Heart is normal size. Lungs are clear. No effusions.  IMPRESSION: Right fourth through eighth rib fractures. Small amount of subcutaneous emphysema. No visible pneumothorax.   Electronically Signed   By: Rolm Baptise M.D.   On: 01/09/2015 14:32   Dg Shoulder Right  01/09/2015   CLINICAL DATA:  Status post 6 football today with superior right shoulder pain.  EXAM: RIGHT SHOULDER - 2+ VIEW  COMPARISON:  None.   FINDINGS: The distal clavicle is elevated relative to the acromion consistent with grade 3 AC joint separation. The humerus is located. Acute fractures of the right fourth, fifth and sixth ribs are identified. No pneumothorax is seen.  IMPRESSION: Grade 3 AC joint separation.  Acute right fourth through sixth rib fractures.   Electronically Signed   By: Inge Rise M.D.   On: 01/09/2015 14:30    Medications  HYDROmorphone (DILAUDID) injection 1 mg (1 mg Intravenous Given 01/09/15 1428)  ondansetron (ZOFRAN) injection 4 mg (4 mg Intravenous Given 01/09/15 1423)     MDM   Final diagnoses:  Fall  Rib fractures, right, closed, initial encounter  AC separation, type  3, right, initial encounter    Pain improved with treatment.  Discussed option of hospitalization for pain management if pain were severe.  Pt does not want to be hospitalized and wants to go home.  Will dc home with pain meds.  Incentive spirometer and shoulder sling.  Follow up ortho.  Monitor for shortness of breath.  Pt asked about travelling.  I explained that travelling by air with a pneumothorax would be very dangerous.  He should monitor closely for any respiratory symptoms.  If any develops he should have another xray before air travel.    Dorie Rank, MD 01/09/15 [phone]

## 2015-01-09 NOTE — ED Notes (Signed)
Pt states he fell from scaffolding (about 74ft) -- fell onto R shoulder; pain in shoulder and R posterior, lower ribs; reports lightheadedness and sob with exertion.

## 2015-03-09 ENCOUNTER — Encounter: Payer: 59 | Admitting: Internal Medicine

## 2015-03-11 ENCOUNTER — Ambulatory Visit (INDEPENDENT_AMBULATORY_CARE_PROVIDER_SITE_OTHER): Payer: 59 | Admitting: Internal Medicine

## 2015-03-11 ENCOUNTER — Telehealth: Payer: Self-pay | Admitting: Internal Medicine

## 2015-03-11 ENCOUNTER — Encounter: Payer: Self-pay | Admitting: Internal Medicine

## 2015-03-11 VITALS — BP 122/80 | HR 84 | Temp 98.4°F | Resp 16 | Ht 71.0 in | Wt 195.4 lb

## 2015-03-11 DIAGNOSIS — Z0000 Encounter for general adult medical examination without abnormal findings: Secondary | ICD-10-CM | POA: Diagnosis not present

## 2015-03-11 DIAGNOSIS — E785 Hyperlipidemia, unspecified: Secondary | ICD-10-CM

## 2015-03-11 DIAGNOSIS — Z23 Encounter for immunization: Secondary | ICD-10-CM

## 2015-03-11 DIAGNOSIS — Z418 Encounter for other procedures for purposes other than remedying health state: Secondary | ICD-10-CM

## 2015-03-11 DIAGNOSIS — Z299 Encounter for prophylactic measures, unspecified: Secondary | ICD-10-CM

## 2015-03-11 DIAGNOSIS — K219 Gastro-esophageal reflux disease without esophagitis: Secondary | ICD-10-CM

## 2015-03-11 DIAGNOSIS — J302 Other seasonal allergic rhinitis: Secondary | ICD-10-CM

## 2015-03-11 MED ORDER — SIMVASTATIN 10 MG PO TABS: ORAL_TABLET | ORAL | Status: DC

## 2015-03-11 MED ORDER — OMEPRAZOLE 40 MG PO CPDR: DELAYED_RELEASE_CAPSULE | ORAL | Status: DC

## 2015-03-11 MED ORDER — HYDROCORTISONE 2.5 % RE CREA: 1.0000 "application " | TOPICAL_CREAM | Freq: Two times a day (BID) | RECTAL | Status: DC | PRN

## 2015-03-11 MED ORDER — CYCLOBENZAPRINE HCL 5 MG PO TABS: 5.0000 mg | ORAL_TABLET | Freq: Three times a day (TID) | ORAL | Status: DC | PRN

## 2015-03-11 NOTE — Progress Notes (Signed)
Pre visit review using our clinic review tool, if applicable. No additional management support is needed unless otherwise documented below in the visit note. 

## 2015-03-11 NOTE — Patient Instructions (Signed)
We will check the blood work today and have signed your papers.   You are doing great and once the ribs are 100% don't forget to start running again.   We have given you the pneumonia shot today and next year when you come you will get the booster shot.   Health Maintenance A healthy lifestyle and preventative care can promote health and wellness.  Maintain regular health, dental, and eye exams.  Eat a healthy diet. Foods like vegetables, fruits, whole grains, low-fat dairy products, and lean protein foods contain the nutrients you need and are low in calories. Decrease your intake of foods high in solid fats, added sugars, and salt. Get information about a proper diet from your health care provider, if necessary.  Regular physical exercise is one of the most important things you can do for your health. Most adults should get at least 150 minutes of moderate-intensity exercise (any activity that increases your heart rate and causes you to sweat) each week. In addition, most adults need muscle-strengthening exercises on 2 or more days a week.   Maintain a healthy weight. The body mass index (BMI) is a screening tool to identify possible weight problems. It provides an estimate of body fat based on height and weight. Your health care provider can find your BMI and can help you achieve or maintain a healthy weight. For males 20 years and older:  A BMI below 18.5 is considered underweight.  A BMI of 18.5 to 24.9 is normal.  A BMI of 25 to 29.9 is considered overweight.  A BMI of 30 and above is considered obese.  Maintain normal blood lipids and cholesterol by exercising and minimizing your intake of saturated fat. Eat a balanced diet with plenty of fruits and vegetables. Blood tests for lipids and cholesterol should begin at age 24 and be repeated every 5 years. If your lipid or cholesterol levels are high, you are over age 51, or you are at high risk for heart disease, you may need your  cholesterol levels checked more frequently.Ongoing high lipid and cholesterol levels should be treated with medicines if diet and exercise are not working.  If you smoke, find out from your health care provider how to quit. If you do not use tobacco, do not start.  Lung cancer screening is recommended for adults aged 72-80 years who are at high risk for developing lung cancer because of a history of smoking. A yearly low-dose CT scan of the lungs is recommended for people who have at least a 30-pack-year history of smoking and are current smokers or have quit within the past 15 years. A pack year of smoking is smoking an average of 1 pack of cigarettes a day for 1 year (for example, a 30-pack-year history of smoking could mean smoking 1 pack a day for 30 years or 2 packs a day for 15 years). Yearly screening should continue until the smoker has stopped smoking for at least 15 years. Yearly screening should be stopped for people who develop a health problem that would prevent them from having lung cancer treatment.  If you choose to drink alcohol, do not have more than 2 drinks per day. One drink is considered to be 12 oz (360 mL) of beer, 5 oz (150 mL) of wine, or 1.5 oz (45 mL) of liquor.  Avoid the use of street drugs. Do not share needles with anyone. Ask for help if you need support or instructions about stopping the use of  drugs.  High blood pressure causes heart disease and increases the risk of stroke. Blood pressure should be checked at least every 1-2 years. Ongoing high blood pressure should be treated with medicines if weight loss and exercise are not effective.  If you are 80-55 years old, ask your health care provider if you should take aspirin to prevent heart disease.  Diabetes screening involves taking a blood sample to check your fasting blood sugar level. This should be done once every 3 years after age 72 if you are at a normal weight and without risk factors for diabetes. Testing  should be considered at a younger age or be carried out more frequently if you are overweight and have at least 1 risk factor for diabetes.  Colorectal cancer can be detected and often prevented. Most routine colorectal cancer screening begins at the age of 92 and continues through age 3. However, your health care provider may recommend screening at an earlier age if you have risk factors for colon cancer. On a yearly basis, your health care provider may provide home test kits to check for hidden blood in the stool. A small camera at the end of a tube may be used to directly examine the colon (sigmoidoscopy or colonoscopy) to detect the earliest forms of colorectal cancer. Talk to your health care provider about this at age 38 when routine screening begins. A direct exam of the colon should be repeated every 5-10 years through age 102, unless early forms of precancerous polyps or small growths are found.  People who are at an increased risk for hepatitis B should be screened for this virus. You are considered at high risk for hepatitis B if:  You were born in a country where hepatitis B occurs often. Talk with your health care provider about which countries are considered high risk.  Your parents were born in a high-risk country and you have not received a shot to protect against hepatitis B (hepatitis B vaccine).  You have HIV or AIDS.  You use needles to inject street drugs.  You live with, or have sex with, someone who has hepatitis B.  You are a man who has sex with other men (MSM).  You get hemodialysis treatment.  You take certain medicines for conditions like cancer, organ transplantation, and autoimmune conditions.  Hepatitis C blood testing is recommended for all people born from [birth year] through 1964 and any individual with known risk factors for hepatitis C.  Healthy men should no longer receive prostate-specific antigen (PSA) blood tests as part of routine cancer screening. Talk to  your health care provider about prostate cancer screening.  Testicular cancer screening is not recommended for adolescents or adult males who have no symptoms. Screening includes self-exam, a health care provider exam, and other screening tests. Consult with your health care provider about any symptoms you have or any concerns you have about testicular cancer.  Practice safe sex. Use condoms and avoid high-risk sexual practices to reduce the spread of sexually transmitted infections (STIs).  You should be screened for STIs, including gonorrhea and chlamydia if:  You are sexually active and are younger than 24 years.  You are older than 24 years, and your health care provider tells you that you are at risk for this type of infection.  Your sexual activity has changed since you were last screened, and you are at an increased risk for chlamydia or gonorrhea. Ask your health care provider if you are at risk.  If  you are at risk of being infected with HIV, it is recommended that you take a prescription medicine daily to prevent HIV infection. This is called pre-exposure prophylaxis (PrEP). You are considered at risk if:  You are a man who has sex with other men (MSM).  You are a heterosexual man who is sexually active with multiple partners.  You take drugs by injection.  You are sexually active with a partner who has HIV.  Talk with your health care provider about whether you are at high risk of being infected with HIV. If you choose to begin PrEP, you should first be tested for HIV. You should then be tested every 3 months for as long as you are taking PrEP.  Use sunscreen. Apply sunscreen liberally and repeatedly throughout the day. You should seek shade when your shadow is shorter than you. Protect yourself by wearing long sleeves, pants, a wide-brimmed hat, and sunglasses year round whenever you are outdoors.  Tell your health care provider of new moles or changes in moles, especially if  there is a change in shape or color. Also, tell your health care provider if a mole is larger than the size of a pencil eraser.  A one-time screening for abdominal aortic aneurysm (AAA) and surgical repair of large AAAs by ultrasound is recommended for men aged 50-75 years who are current or former smokers.  Stay current with your vaccines (immunizations). Document Released: 06/07/2008 Document Revised: 12/15/2013 Document Reviewed: 05/07/2011 [facility] Patient Information 2015 [place], [place]. This information is not intended to replace advice given to you by your health care provider. Make sure you discuss any questions you have with your health care provider.

## 2015-03-11 NOTE — Telephone Encounter (Signed)
Please advise, thanks.

## 2015-03-11 NOTE — Telephone Encounter (Signed)
Pt called in and wanted to include the PSA test in blood work , he said he has a history of cancer in his family

## 2015-03-15 ENCOUNTER — Encounter: Payer: Self-pay | Admitting: Internal Medicine

## 2015-03-15 DIAGNOSIS — Z0000 Encounter for general adult medical examination without abnormal findings: Secondary | ICD-10-CM | POA: Insufficient documentation

## 2015-03-15 NOTE — Assessment & Plan Note (Signed)
Refill omeprazole while he takes daily for his symptoms. Doing well.

## 2015-03-15 NOTE — Assessment & Plan Note (Signed)
Check labs today, taking zocor 10 mg daily. No side effects. Adjust as needed after labs.

## 2015-03-15 NOTE — Assessment & Plan Note (Signed)
Up to date on colonoscopy, completed shingles shot. Given pneumonia 23 today and give prevnar next year. Tdap given 2014, flu done this year. Declines Hep c and HIV testing today. Talked about sun safety and exercise regularly. Non-smoker.

## 2015-03-15 NOTE — Progress Notes (Signed)
   Subjective:    Patient ID: Gregory Santiago, male    DOB: 10-19-1951, 64 y.o.   MRN: [id number]  HPI Here for wellness. No new complaints. See A/P for status and plan for chronic problems.   PMH, [facility], social history reviewed and updated today.   Review of Systems  Constitutional: Negative for fever, chills, activity change, appetite change, fatigue and unexpected weight change.  Respiratory: Negative.   Cardiovascular: Negative.   Gastrointestinal: Negative.   Musculoskeletal: Negative.   Skin: Negative.   Neurological: Negative.   Psychiatric/Behavioral: Negative.       Objective:   Physical Exam  Constitutional: He is oriented to person, place, and time. He appears well-developed and well-nourished.  HENT:  Head: Normocephalic and atraumatic.  Eyes: EOM are normal.  Neck: Normal range of motion.  Cardiovascular: Normal rate and regular rhythm.   Pulmonary/Chest: Effort normal and breath sounds normal.  Abdominal: Soft. Bowel sounds are normal.  Neurological: He is alert and oriented to person, place, and time. Coordination normal.  Skin: Skin is warm and dry.   Filed Vitals:   03/11/15 1458  BP: 122/80  Pulse: 84  Temp: 98.4 F (36.9 C)  TempSrc: Oral  Resp: 16  Height: 5\' 11"  (1.803 m)  Weight: 195 lb 6.4 oz (88.633 kg)  SpO2: 95%      Assessment & Plan:  Pneumonia 23 given today

## 2015-03-15 NOTE — Assessment & Plan Note (Signed)
Doing well on flonase and will continue.

## 2015-08-25 ENCOUNTER — Other Ambulatory Visit (INDEPENDENT_AMBULATORY_CARE_PROVIDER_SITE_OTHER): Payer: 59

## 2015-08-25 DIAGNOSIS — Z0000 Encounter for general adult medical examination without abnormal findings: Secondary | ICD-10-CM

## 2015-08-25 LAB — LIPID PANEL
Cholesterol: 196 mg/dL (ref 0–200)
HDL: 36.7 mg/dL — ABNORMAL LOW (ref >39.00)
LDL Cholesterol: 136 mg/dL — ABNORMAL HIGH (ref 0–99)
NonHDL: 159.63
Total CHOL/HDL Ratio: 5
Triglycerides: 116 mg/dL (ref 0.0–149.0)
VLDL: 23.2 mg/dL (ref 0.0–40.0)

## 2015-08-25 LAB — BASIC METABOLIC PANEL
BUN: 16 mg/dL (ref 6–23)
CO2: 29 mEq/L (ref 19–32)
Calcium: 9.5 mg/dL (ref 8.4–10.5)
Chloride: 105 mEq/L (ref 96–112)
Creatinine, Ser: 0.98 mg/dL (ref 0.40–1.50)
GFR: 82.13 mL/min (ref >60.00)
Glucose, Bld: 85 mg/dL (ref 70–99)
Potassium: 5.1 mEq/L (ref 3.5–5.1)
Sodium: 140 mEq/L (ref 135–145)

## 2015-08-25 LAB — CBC
HCT: 42.5 % (ref 39.0–52.0)
Hemoglobin: 14.7 g/dL (ref 13.0–17.0)
MCHC: 34.5 g/dL (ref 30.0–36.0)
MCV: 86.4 fl (ref 78.0–100.0)
Platelets: 240 10*3/uL (ref 150.0–400.0)
RBC: 4.93 Mil/uL (ref 4.22–5.81)
RDW: 12.8 % (ref 11.5–15.5)
WBC: 5.2 10*3/uL (ref 4.0–10.5)

## 2015-08-25 LAB — PSA: PSA: 0.03 ng/mL — ABNORMAL LOW (ref 0.10–4.00)

## 2016-03-12 ENCOUNTER — Other Ambulatory Visit: Payer: BLUE CROSS/BLUE SHIELD

## 2016-03-12 ENCOUNTER — Encounter: Payer: Self-pay | Admitting: Internal Medicine

## 2016-03-12 ENCOUNTER — Ambulatory Visit (INDEPENDENT_AMBULATORY_CARE_PROVIDER_SITE_OTHER)
Admission: RE | Admit: 2016-03-12 | Discharge: 2016-03-12 | Disposition: A | Discharge status: Home / Self Care | Payer: BLUE CROSS/BLUE SHIELD | Source: Ambulatory Visit | Attending: Internal Medicine | Admitting: Internal Medicine

## 2016-03-12 ENCOUNTER — Ambulatory Visit (INDEPENDENT_AMBULATORY_CARE_PROVIDER_SITE_OTHER): Payer: BLUE CROSS/BLUE SHIELD | Admitting: Internal Medicine

## 2016-03-12 ENCOUNTER — Encounter: Payer: Self-pay | Admitting: Emergency Medicine

## 2016-03-12 VITALS — BP 116/82 | HR 70 | Temp 98.3°F | Resp 16 | Wt 199.0 lb

## 2016-03-12 DIAGNOSIS — R05 Cough: Secondary | ICD-10-CM

## 2016-03-12 DIAGNOSIS — Z0000 Encounter for general adult medical examination without abnormal findings: Secondary | ICD-10-CM | POA: Diagnosis not present

## 2016-03-12 DIAGNOSIS — R059 Cough, unspecified: Secondary | ICD-10-CM

## 2016-03-12 DIAGNOSIS — K219 Gastro-esophageal reflux disease without esophagitis: Secondary | ICD-10-CM | POA: Diagnosis not present

## 2016-03-12 DIAGNOSIS — E785 Hyperlipidemia, unspecified: Secondary | ICD-10-CM

## 2016-03-12 LAB — HIV ANTIBODY (ROUTINE TESTING W REFLEX): HIV 1&2 Ab, 4th Generation: NONREACTIVE

## 2016-03-12 IMAGING — DX DG CHEST 2V
2 series · 2 of 2 positions shown · non-contrast
Comparison: [DATE]

CLINICAL DATA: Intermittent cough for 1 year

EXAM:
CHEST  2 VIEW

[chest pa]
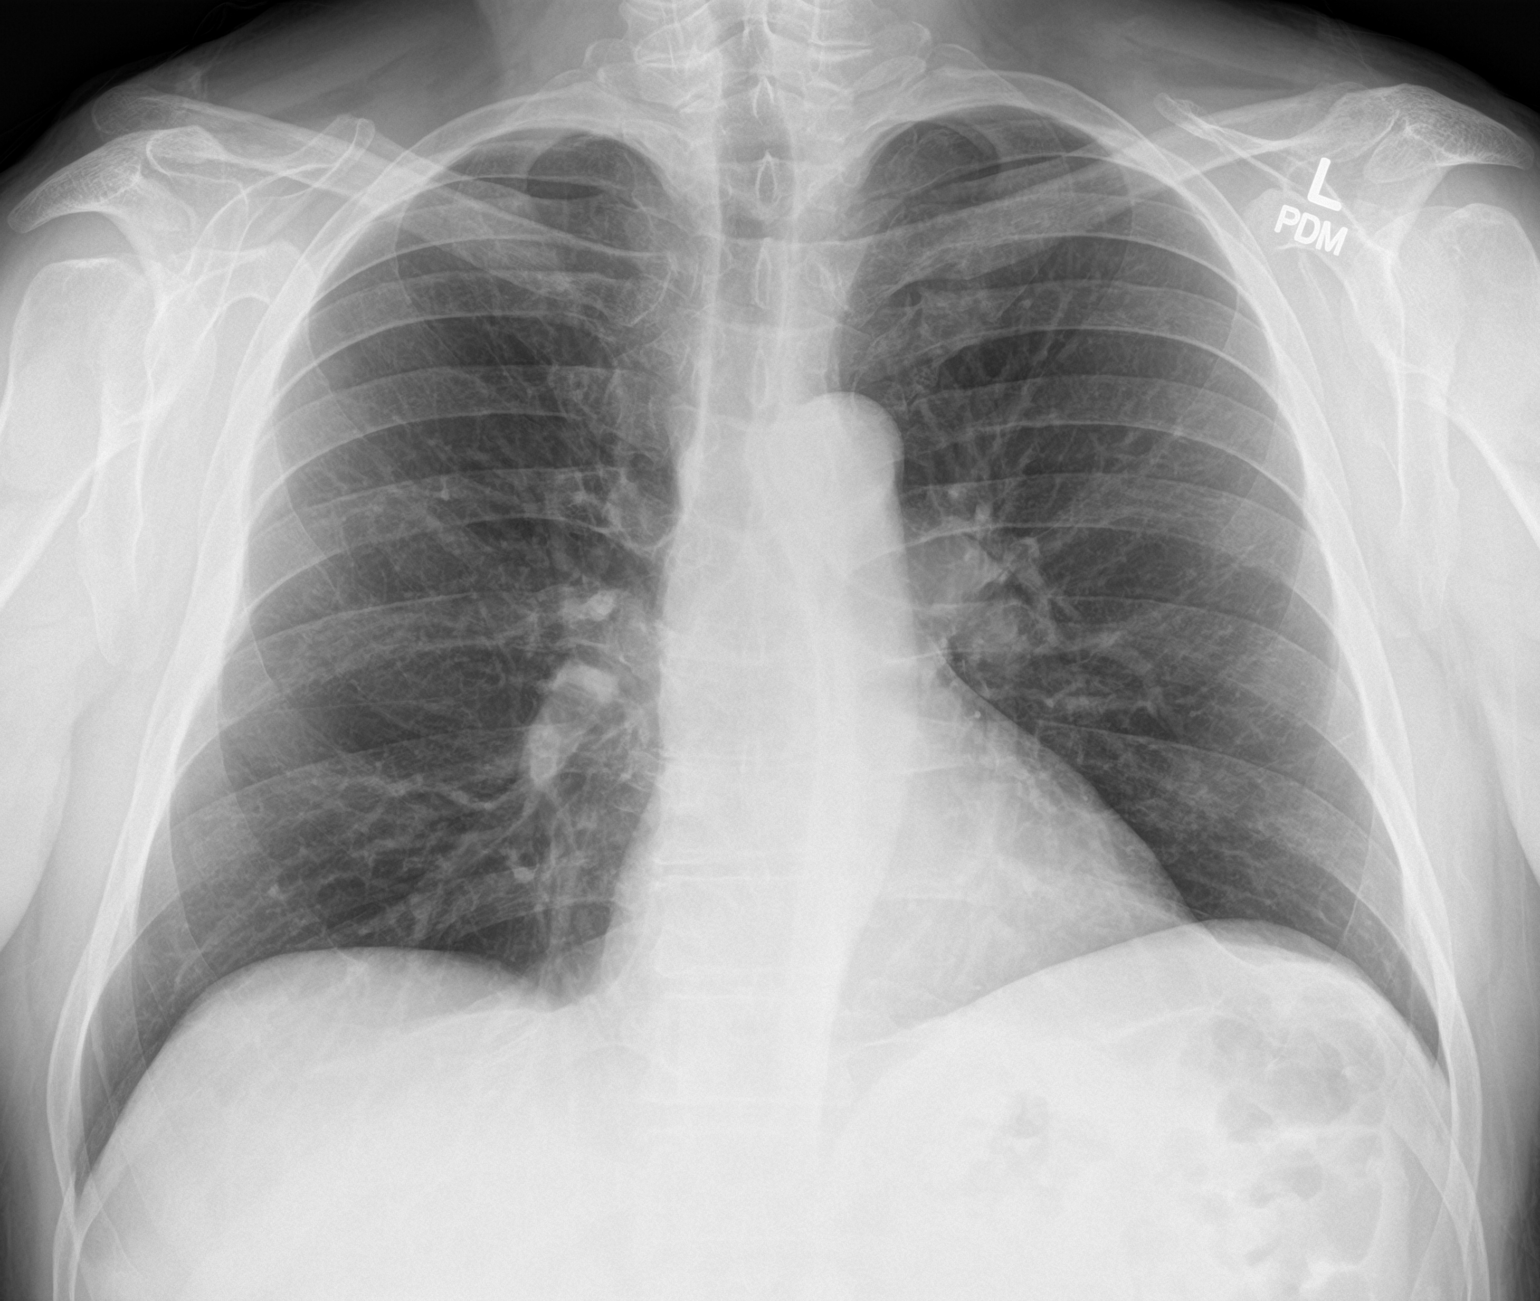

[chest lat]
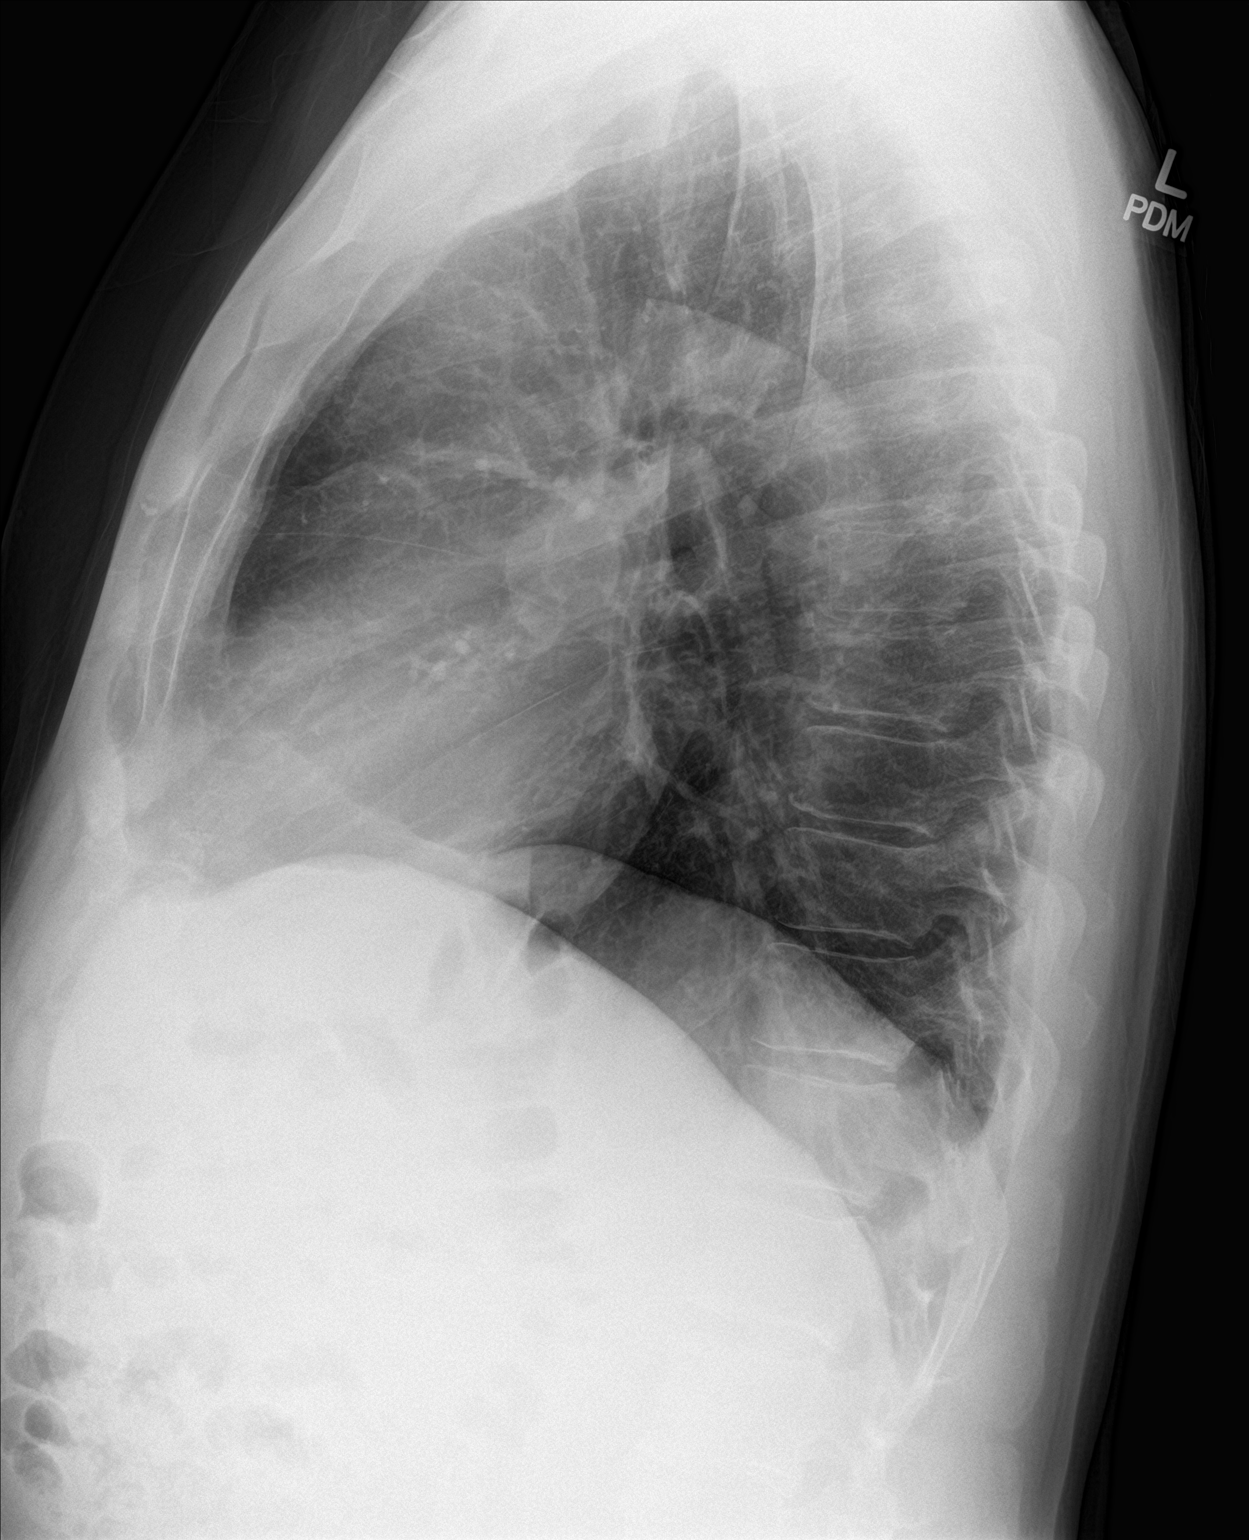

[2 of 2 positions shown; findings below may reference images not displayed]

FINDINGS: The heart size and mediastinal contours are within normal limits.
Both lungs are clear. The visualized skeletal structures are
unremarkable.
IMPRESSION: No active cardiopulmonary disease.

## 2016-03-12 MED ORDER — CYCLOBENZAPRINE HCL 5 MG PO TABS: 5.0000 mg | ORAL_TABLET | Freq: Three times a day (TID) | ORAL | Status: DC | PRN

## 2016-03-12 NOTE — Assessment & Plan Note (Signed)
Controlled Continue simvastatin at current dose

## 2016-03-12 NOTE — Progress Notes (Signed)
Pre visit review using our clinic review tool, if applicable. No additional management support is needed unless otherwise documented below in the visit note. 

## 2016-03-12 NOTE — Progress Notes (Signed)
Subjective:    Patient ID: Gregory Santiago, male    DOB: 1951/03/06, 65 y.o.   MRN: [id number]  HPI He is here for a physical.   He has a cough that his wife is concerned about. He does not notice it.  He is an ex-smokerAnd she thought he should have an x-ray.  He thinks the cough occurs primarily after running.  It does not related to eating or at night.  He does use nasocort.  He clears his throat and has always felt that be related to allergies. He denies a history of asthma. He currently runs once or twice a week.Marland Kitchen      GERD:  He is taking his medication daily as prescribed.  He denies any GERD symptoms and feels his GERD is well controlled.    Medications and allergies reviewed with patient and updated if appropriate.  Patient Active Problem List   Diagnosis Date Noted  . Routine general medical examination at a health care facility 03/15/2015  . GERD (gastroesophageal reflux disease)   . HLD (hyperlipidemia)   . Allergic rhinitis     Current Outpatient Prescriptions on File Prior to Visit  Medication Sig Dispense Refill  . fluticasone (FLONASE) 50 MCG/ACT nasal spray Place 2 sprays into both nostrils daily.    . hydrocortisone (ANUSOL-HC) 2.5 % rectal cream Place 1 application rectally 2 (two) times daily as needed for hemorrhoids or itching. 30 g 1  . omeprazole (PRILOSEC) 40 MG capsule Take 1 capsule by mouth  daily 90 capsule 3  . simvastatin (ZOCOR) 10 MG tablet Take 1 tablet by mouth  daily 90 tablet 3   No current facility-administered medications on file prior to visit.    Past Medical History  Diagnosis Date  . GERD (gastroesophageal reflux disease)   . Hepatitis A   . HLD (hyperlipidemia)   . Hemorrhoids   . Duodenitis   . Fundic gland polyps of stomach, benign   . Allergic rhinitis     Past Surgical History  Procedure Laterality Date  . Arm surgery      left, ligaments and tendon repair, accident  . Nasal septum surgery      Social History   Social  History  . Marital Status: Married    Spouse Name: N/A  . Number of Children: 3  . Years of Education: N/A   Occupational History  . electrician    Social History Main Topics  . Smoking status: Former Smoker -- 5 years    Types: Cigarettes    Quit date: 12/24/1982  . Smokeless tobacco: Never Used  . Alcohol Use: Yes     Comment: social  . Drug Use: No  . Sexual Activity: Not Asked   Other Topics Concern  . None   Social History Narrative   Exercise: running 1-2 times a week    Family History  Problem Relation Age of Onset  . Prostate cancer Father   . Arthritis Father   . Colon polyps Mother   . Diabetes Mother   . Parkinson's disease Mother   . Arthritis Mother     Review of Systems  Constitutional: Negative for fever, chills, appetite change and unexpected weight change.  HENT: Positive for hearing loss (mild) and postnasal drip (allergies).   Eyes: Negative for visual disturbance.  Respiratory: Positive for cough. Negative for shortness of breath and wheezing.   Cardiovascular: Negative for chest pain, palpitations and leg swelling.  Gastrointestinal: Negative for nausea, abdominal pain,  diarrhea, constipation and blood in stool.       No GERD  Genitourinary: Negative for dysuria, hematuria and difficulty urinating.       Mild decreased flow  Musculoskeletal: Positive for myalgias and arthralgias. Negative for back pain.  Skin: Negative for color change and rash.  Neurological: Positive for headaches (sinus headaches). Negative for dizziness and light-headedness.  Psychiatric/Behavioral: Negative for dysphoric mood. The patient is not nervous/anxious.        Objective:   Filed Vitals:   03/12/16 1515  BP: 116/82  Pulse: 70  Temp: 98.3 F (36.8 C)  Resp: 16   Filed Weights   03/12/16 1515  Weight: 199 lb (90.266 kg)   Body mass index is 27.77 kg/(m^2).   Physical Exam Constitutional: He appears well-developed and well-nourished. No distress.    HENT:  Head: Normocephalic and atraumatic.  Right Ear: External ear normal.  Left Ear: External ear normal.  Mouth/Throat: Oropharynx is clear and moist.  Normal ear canals and TM b/l  Eyes: Conjunctivae and EOM are normal.  Neck: Neck supple. No tracheal deviation present. No thyromegaly present.  No carotid bruit  Cardiovascular: Normal rate, regular rhythm, normal heart sounds and intact distal pulses.   No murmur heard. Pulmonary/Chest: Effort normal and breath sounds normal. No respiratory distress. He has no wheezes. He has no rales.  Abdominal: Soft. Bowel sounds are normal. He exhibits no distension. There is no tenderness.  Genitourinary:  deferred  Musculoskeletal: He exhibits no edema.  Lymphadenopathy:    He has no cervical adenopathy.  Skin: Skin is warm and dry. He is not diaphoretic.  Psychiatric: He has a normal mood and affect. His behavior is normal.       Assessment & Plan:   Physical exam: Screening blood work done within the past year-reviewed. No routine blood work to He agreed to have HIV and hepatitis C screening-ordered EKG today given his history of hyper lipidemia. Does not have a baseline EKG Colonoscopy up-to-date Immunizations up-to-date He is working on increasing his exercise Eye exams up-to-date No evidence of substance abuse No skin concerns Last PSA very low without change  Cough: Possibly allergy or reactive airway disease Given that he is an ex-smoker we will get a chest x-ray He will monitor and let me know if he wants to pursue further evaluation if the chest x-ray is normal  See Problem List for Assessment and Plan of chronic medical problems.  Follow-up annually, sooner if needed  Eye exam up to date

## 2016-03-12 NOTE — Patient Instructions (Addendum)
Have a chest xray today.   Test(s) ordered today. Your results will be released to [place] (or called to you) after review, usually within 72hours after test completion. If any changes need to be made, you will be notified at that same time.  All other Health Maintenance issues reviewed.   All recommended immunizations and age-appropriate screenings are up-to-date.  No immunizations administered today.   Medications reviewed and updated.  No changes recommended at this time.  Your prescription(s) have been submitted to your pharmacy. Please take as directed and contact our office if you believe you are having problem(s) with the medication(s).   Please followup annually    Health Maintenance, Male A healthy lifestyle and preventative care can promote health and wellness.  Maintain regular health, dental, and eye exams.  Eat a healthy diet. Foods like vegetables, fruits, whole grains, low-fat dairy products, and lean protein foods contain the nutrients you need and are low in calories. Decrease your intake of foods high in solid fats, added sugars, and salt. Get information about a proper diet from your health care provider, if necessary.  Regular physical exercise is one of the most important things you can do for your health. Most adults should get at least 150 minutes of moderate-intensity exercise (any activity that increases your heart rate and causes you to sweat) each week. In addition, most adults need muscle-strengthening exercises on 2 or more days a week.   Maintain a healthy weight. The body mass index (BMI) is a screening tool to identify possible weight problems. It provides an estimate of body fat based on height and weight. Your health care provider can find your BMI and can help you achieve or maintain a healthy weight. For males 20 years and older:  A BMI below 18.5 is considered underweight.  A BMI of 18.5 to 24.9 is normal.  A BMI of 25 to 29.9 is considered  overweight.  A BMI of 30 and above is considered obese.  Maintain normal blood lipids and cholesterol by exercising and minimizing your intake of saturated fat. Eat a balanced diet with plenty of fruits and vegetables. Blood tests for lipids and cholesterol should begin at age 56 and be repeated every 5 years. If your lipid or cholesterol levels are high, you are over age 30, or you are at high risk for heart disease, you may need your cholesterol levels checked more frequently.Ongoing high lipid and cholesterol levels should be treated with medicines if diet and exercise are not working.  If you smoke, find out from your health care provider how to quit. If you do not use tobacco, do not start.  Lung cancer screening is recommended for adults aged 58-80 years who are at high risk for developing lung cancer because of a history of smoking. A yearly low-dose CT scan of the lungs is recommended for people who have at least a 30-pack-year history of smoking and are current smokers or have quit within the past 15 years. A pack year of smoking is smoking an average of 1 pack of cigarettes a day for 1 year (for example, a 30-pack-year history of smoking could mean smoking 1 pack a day for 30 years or 2 packs a day for 15 years). Yearly screening should continue until the smoker has stopped smoking for at least 15 years. Yearly screening should be stopped for people who develop a health problem that would prevent them from having lung cancer treatment.  If you choose to drink alcohol,  do not have more than 2 drinks per day. One drink is considered to be 12 oz (360 mL) of beer, 5 oz (150 mL) of wine, or 1.5 oz (45 mL) of liquor.  Avoid the use of street drugs. Do not share needles with anyone. Ask for help if you need support or instructions about stopping the use of drugs.  High blood pressure causes heart disease and increases the risk of stroke. High blood pressure is more likely to develop in:  People  who have blood pressure in the end of the normal range (100-139/85-89 mm Hg).  People who are overweight or obese.  People who are African American.  If you are 57-67 years of age, have your blood pressure checked every 3-5 years. If you are 65 years of age or older, have your blood pressure checked every year. You should have your blood pressure measured twice--once when you are at a hospital or clinic, and once when you are not at a hospital or clinic. Record the average of the two measurements. To check your blood pressure when you are not at a hospital or clinic, you can use:  An automated blood pressure machine at a pharmacy.  A home blood pressure monitor.  If you are 15-17 years old, ask your health care provider if you should take aspirin to prevent heart disease.  Diabetes screening involves taking a blood sample to check your fasting blood sugar level. This should be done once every 3 years after age 93 if you are at a normal weight and without risk factors for diabetes. Testing should be considered at a younger age or be carried out more frequently if you are overweight and have at least 1 risk factor for diabetes.  Colorectal cancer can be detected and often prevented. Most routine colorectal cancer screening begins at the age of 66 and continues through age 39. However, your health care provider may recommend screening at an earlier age if you have risk factors for colon cancer. On a yearly basis, your health care provider may provide home test kits to check for hidden blood in the stool. A small camera at the end of a tube may be used to directly examine the colon (sigmoidoscopy or colonoscopy) to detect the earliest forms of colorectal cancer. Talk to your health care provider about this at age 76 when routine screening begins. A direct exam of the colon should be repeated every 5-10 years through age 33, unless early forms of precancerous polyps or small growths are found.  People  who are at an increased risk for hepatitis B should be screened for this virus. You are considered at high risk for hepatitis B if:  You were born in a country where hepatitis B occurs often. Talk with your health care provider about which countries are considered high risk.  Your parents were born in a high-risk country and you have not received a shot to protect against hepatitis B (hepatitis B vaccine).  You have HIV or AIDS.  You use needles to inject street drugs.  You live with, or have sex with, someone who has hepatitis B.  You are a man who has sex with other men (MSM).  You get hemodialysis treatment.  You take certain medicines for conditions like cancer, organ transplantation, and autoimmune conditions.  Hepatitis C blood testing is recommended for all people born from [birth year] through 1965 and any individual with known risk factors for hepatitis C.  Healthy men should no longer  receive prostate-specific antigen (PSA) blood tests as part of routine cancer screening. Talk to your health care provider about prostate cancer screening.  Testicular cancer screening is not recommended for adolescents or adult males who have no symptoms. Screening includes self-exam, a health care provider exam, and other screening tests. Consult with your health care provider about any symptoms you have or any concerns you have about testicular cancer.  Practice safe sex. Use condoms and avoid high-risk sexual practices to reduce the spread of sexually transmitted infections (STIs).  You should be screened for STIs, including gonorrhea and chlamydia if:  You are sexually active and are younger than 24 years.  You are older than 24 years, and your health care provider tells you that you are at risk for this type of infection.  Your sexual activity has changed since you were last screened, and you are at an increased risk for chlamydia or gonorrhea. Ask your health care provider if you are at  risk.  If you are at risk of being infected with HIV, it is recommended that you take a prescription medicine daily to prevent HIV infection. This is called pre-exposure prophylaxis (PrEP). You are considered at risk if:  You are a man who has sex with other men (MSM).  You are a heterosexual man who is sexually active with multiple partners.  You take drugs by injection.  You are sexually active with a partner who has HIV.  Talk with your health care provider about whether you are at high risk of being infected with HIV. If you choose to begin PrEP, you should first be tested for HIV. You should then be tested every 3 months for as long as you are taking PrEP.  Use sunscreen. Apply sunscreen liberally and repeatedly throughout the day. You should seek shade when your shadow is shorter than you. Protect yourself by wearing long sleeves, pants, a wide-brimmed hat, and sunglasses year round whenever you are outdoors.  Tell your health care provider of new moles or changes in moles, especially if there is a change in shape or color. Also, tell your health care provider if a mole is larger than the size of a pencil eraser.  A one-time screening for abdominal aortic aneurysm (AAA) and surgical repair of large AAAs by ultrasound is recommended for men aged 103-75 years who are current or former smokers.  Stay current with your vaccines (immunizations).   This information is not intended to replace advice given to you by your health care provider. Make sure you discuss any questions you have with your health care provider.   Document Released: 06/07/2008 Document Revised: 12/31/2014 Document Reviewed: 05/07/2011 Elsevier Interactive Patient Education [employer].

## 2016-03-12 NOTE — Assessment & Plan Note (Signed)
Controlled.  Continue omeprazole 40 mg daily. 

## 2016-03-13 ENCOUNTER — Encounter: Payer: Self-pay | Admitting: Internal Medicine

## 2016-03-13 LAB — HEPATITIS C ANTIBODY: HCV Ab: NEGATIVE

## 2016-05-29 ENCOUNTER — Encounter: Payer: Self-pay | Admitting: Internal Medicine

## 2016-06-01 MED ORDER — OMEPRAZOLE 40 MG PO CPDR: DELAYED_RELEASE_CAPSULE | ORAL | Status: DC

## 2016-06-01 MED ORDER — SIMVASTATIN 10 MG PO TABS: ORAL_TABLET | ORAL | Status: DC

## 2016-10-03 DIAGNOSIS — Z23 Encounter for immunization: Secondary | ICD-10-CM | POA: Diagnosis not present

## 2016-12-24 HISTORY — PX: MENISCUS REPAIR: SHX5179

## 2017-03-17 ENCOUNTER — Encounter: Payer: Self-pay | Admitting: Internal Medicine

## 2017-03-17 NOTE — Progress Notes (Signed)
Subjective:    Patient ID: Gregory Santiago, male    DOB: [birth year], 66 y.o.   MRN: [id number]  HPI He is here for a physical exam.   Has had lower energy.  He wonders about his B12 level due to being on PPI's long term. He has not been running - he is not sure if it is the fatigue or laziness.  He has been having GERD 1-2 times a week.  He is taking his medication. Daily.  He has been taking Tums as needed.     Medications and allergies reviewed with patient and updated if appropriate.  Patient Active Problem List   Diagnosis Date Noted  . GERD (gastroesophageal reflux disease)   . HLD (hyperlipidemia)   . Allergic rhinitis     Current Outpatient Prescriptions on File Prior to Visit  Medication Sig Dispense Refill  . cyclobenzaprine (FLEXERIL) 5 MG tablet Take 1 tablet (5 mg total) by mouth 3 (three) times daily as needed for muscle spasms. 30 tablet 1  . fluticasone (FLONASE) 50 MCG/ACT nasal spray Place 2 sprays into both nostrils daily.    . hydrocortisone (ANUSOL-HC) 2.5 % rectal cream Place 1 application rectally 2 (two) times daily as needed for hemorrhoids or itching. 30 g 1  . omeprazole (PRILOSEC) 40 MG capsule Take 1 capsule by mouth  daily 90 capsule 3  . simvastatin (ZOCOR) 10 MG tablet Take 1 tablet by mouth  daily 90 tablet 3   No current facility-administered medications on file prior to visit.     Past Medical History:  Diagnosis Date  . Allergic rhinitis   . Duodenitis   . Fundic gland polyps of stomach, benign   . GERD (gastroesophageal reflux disease)   . Hemorrhoids   . Hepatitis A   . HLD (hyperlipidemia)     Past Surgical History:  Procedure Laterality Date  . arm surgery     left, ligaments and tendon repair, accident  . NASAL SEPTUM SURGERY      Social History   Social History  . Marital status: Married    Spouse name: N/A  . Number of children: 3  . Years of education: N/A   Occupational History  . 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 Smoking status: Former Smoker    Years: 5.00    Types: Cigarettes    Quit date: 12/24/1982  . Smokeless tobacco: Never Used  . Alcohol use Yes     Comment: social  . Drug use: No  . Sexual activity: Not Asked   Other Topics Concern  . None   Social History Narrative   Exercise: running 1-2 times a week    Family History  Problem Relation Age of Onset  . Prostate cancer Father   . Arthritis Father   . Colon polyps Mother   . Diabetes Mother   . Parkinson's disease Mother   . Arthritis Mother     Review of Systems  Constitutional: Negative for appetite change, chills, fatigue, fever and unexpected weight change.  Eyes: Negative for visual disturbance.  Respiratory: Negative for cough, shortness of breath and wheezing.   Cardiovascular: Negative for chest pain, palpitations and leg swelling.  Gastrointestinal: Negative for abdominal pain, blood in stool, constipation, diarrhea and nausea.       Gerd 1-2 / week  Genitourinary: Negative for difficulty urinating, dysuria and hematuria.  Musculoskeletal: Positive for back pain (chronic, intermittent - takes flexeril only as needed). Negative  for arthralgias.  Skin: Negative for color change and rash.  Neurological: Positive for headaches (sinus related - flonase as needed). Negative for dizziness and light-headedness.  Psychiatric/Behavioral: Negative for dysphoric mood. The patient is not nervous/anxious.        Objective:   Vitals:   03/18/17 1503  BP: 110/80  Pulse: 69  Resp: 16  Temp: 97.8 F (36.6 C)   Filed Weights   03/18/17 1503  Weight: 204 lb (92.5 kg)   Body mass index is 28.45 kg/m.  Wt Readings from Last 3 Encounters:  03/18/17 204 lb (92.5 kg)  03/12/16 199 lb (90.3 kg)  03/11/15 195 lb 6.4 oz (88.6 kg)     Physical Exam Constitutional: He appears well-developed and well-nourished. No distress.  HENT:  Head: Normocephalic and atraumatic.  Right Ear: External  ear normal.  Left Ear: External ear normal.  Mouth/Throat: Oropharynx is clear and moist.  Normal ear canals and TM b/l  Eyes: Conjunctivae and EOM are normal.  Neck: Neck supple. No tracheal deviation present. No thyromegaly present.  No carotid bruit  Cardiovascular: Normal rate, regular rhythm, normal heart sounds and intact distal pulses.   No murmur heard. Pulmonary/Chest: Effort normal and breath sounds normal. No respiratory distress. He has no wheezes. He has no rales.  Abdominal: Soft. Bowel sounds are normal. He exhibits no distension. There is no tenderness.  Genitourinary: normal prostate in size, no nodules Musculoskeletal: He exhibits no edema.  Lymphadenopathy:   He has no cervical adenopathy.  Skin: Skin is warm and dry. He is not diaphoretic.  Psychiatric: He has a normal mood and affect. His behavior is normal.           Assessment & Plan:   Physical exam: Screening blood work  ordered Immunizations  Had shingles 2015, flu due Colonoscopy   Up to date  Eye exams  Up to date  EKG  Last EKG 2017 Exercise - none - encouraged getting back to running Weight - will work on weight loss Skin  No concerns Substance abuse   none  See Problem List for Assessment and Plan of chronic medical problems.   FU annually

## 2017-03-18 ENCOUNTER — Ambulatory Visit (INDEPENDENT_AMBULATORY_CARE_PROVIDER_SITE_OTHER): Payer: BLUE CROSS/BLUE SHIELD | Admitting: Internal Medicine

## 2017-03-18 ENCOUNTER — Encounter: Payer: Self-pay | Admitting: Internal Medicine

## 2017-03-18 VITALS — BP 110/80 | HR 69 | Temp 97.8°F | Resp 16 | Ht 71.0 in | Wt 204.0 lb

## 2017-03-18 DIAGNOSIS — K219 Gastro-esophageal reflux disease without esophagitis: Secondary | ICD-10-CM

## 2017-03-18 DIAGNOSIS — Z0000 Encounter for general adult medical examination without abnormal findings: Secondary | ICD-10-CM

## 2017-03-18 DIAGNOSIS — E7800 Pure hypercholesterolemia, unspecified: Secondary | ICD-10-CM | POA: Diagnosis not present

## 2017-03-18 MED ORDER — HYDROCORTISONE 2.5 % RE CREA: 1.0000 "application " | TOPICAL_CREAM | Freq: Two times a day (BID) | RECTAL | 5 refills | Status: DC | PRN

## 2017-03-18 MED ORDER — SIMVASTATIN 10 MG PO TABS: ORAL_TABLET | ORAL | 3 refills | Status: DC

## 2017-03-18 MED ORDER — CYCLOBENZAPRINE HCL 5 MG PO TABS: 5.0000 mg | ORAL_TABLET | Freq: Three times a day (TID) | ORAL | 3 refills | Status: DC | PRN

## 2017-03-18 MED ORDER — OMEPRAZOLE 40 MG PO CPDR: DELAYED_RELEASE_CAPSULE | ORAL | 3 refills | Status: DC

## 2017-03-18 MED ORDER — RANITIDINE HCL 150 MG PO CAPS: 150.0000 mg | ORAL_CAPSULE | Freq: Every evening | ORAL | 3 refills | Status: DC

## 2017-03-18 NOTE — Progress Notes (Signed)
Pre visit review using our clinic review tool, if applicable. No additional management support is needed unless otherwise documented below in the visit note. 

## 2017-03-18 NOTE — Assessment & Plan Note (Signed)
Check lipid panel  Continue daily statin Regular exercise and healthy diet encouraged  

## 2017-03-18 NOTE — Patient Instructions (Addendum)
Test(s) ordered today. Your results will be released to [place] (or called to you) after review, usually within 72hours after test completion. If any changes need to be made, you will be notified at that same time.  All other Health Maintenance issues reviewed.   All recommended immunizations and age-appropriate screenings are up-to-date or discussed.  No immunizations administered today.   Medications reviewed and updated.  Changes include starting zantac at night for your heartburn.  Your prescription(s) have been submitted to your pharmacy. Please take as directed and contact our office if you believe you are having problem(s) with the medication(s).   Please followup in one year    Health Maintenance, Male A healthy lifestyle and preventive care is important for your health and wellness. Ask your health care provider about what schedule of regular examinations is right for you. What should I know about weight and diet?  Eat a Healthy Diet  Eat plenty of vegetables, fruits, whole grains, low-fat dairy products, and lean protein.  Do not eat a lot of foods high in solid fats, added sugars, or salt. Maintain a Healthy Weight  Regular exercise can help you achieve or maintain a healthy weight. You should:  Do at least 150 minutes of exercise each week. The exercise should increase your heart rate and make you sweat (moderate-intensity exercise).  Do strength-training exercises at least twice a week. Watch Your Levels of Cholesterol and Blood Lipids  Have your blood tested for lipids and cholesterol every 5 years starting at 65 years of age. If you are at high risk for heart disease, you should start having your blood tested when you are 65 years old. You may need to have your cholesterol levels checked more often if:  Your lipid or cholesterol levels are high.  You are older than 65 years of age.  You are at high risk for heart disease. What should I know about cancer  screening? Many types of cancers can be detected early and may often be prevented. Lung Cancer  You should be screened every year for lung cancer if:  You are a current smoker who has smoked for at least 30 years.  You are a former smoker who has quit within the past 15 years.  Talk to your health care provider about your screening options, when you should start screening, and how often you should be screened. Colorectal Cancer  Routine colorectal cancer screening usually begins at 65 years of age and should be repeated every 5-10 years until you are 66 years old. You may need to be screened more often if early forms of precancerous polyps or small growths are found. Your health care provider may recommend screening at an earlier age if you have risk factors for colon cancer.  Your health care provider may recommend using home test kits to check for hidden blood in the stool.  A small camera at the end of a tube can be used to examine your colon (sigmoidoscopy or colonoscopy). This checks for the earliest forms of colorectal cancer. Prostate and Testicular Cancer  Depending on your age and overall health, your health care provider may do certain tests to screen for prostate and testicular cancer.  Talk to your health care provider about any symptoms or concerns you have about testicular or prostate cancer. Skin Cancer  Check your skin from head to toe regularly.  Tell your health care provider about any new moles or changes in moles, especially if:  There is a change  in a mole's size, shape, or color.  You have a mole that is larger than a pencil eraser.  Always use sunscreen. Apply sunscreen liberally and repeat throughout the day.  Protect yourself by wearing long sleeves, pants, a wide-brimmed hat, and sunglasses when outside. What should I know about heart disease, diabetes, and high blood pressure?  If you are 28-19 years of age, have your blood pressure checked every 3-5  years. If you are 97 years of age or older, have your blood pressure checked every year. You should have your blood pressure measured twice-once when you are at a hospital or clinic, and once when you are not at a hospital or clinic. Record the average of the two measurements. To check your blood pressure when you are not at a hospital or clinic, you can use:  An automated blood pressure machine at a pharmacy.  A home blood pressure monitor.  Talk to your health care provider about your target blood pressure.  If you are between 65-16 years old, ask your health care provider if you should take aspirin to prevent heart disease.  Have regular diabetes screenings by checking your fasting blood sugar level.  If you are at a normal weight and have a low risk for diabetes, have this test once every three years after the age of 39.  If you are overweight and have a high risk for diabetes, consider being tested at a younger age or more often.  A one-time screening for abdominal aortic aneurysm (AAA) by ultrasound is recommended for men aged 64-75 years who are current or former smokers. What should I know about preventing infection? Hepatitis B  If you have a higher risk for hepatitis B, you should be screened for this virus. Talk with your health care provider to find out if you are at risk for hepatitis B infection. Hepatitis C  Blood testing is recommended for:  Everyone born from [birth year] through 1964.  Anyone with known risk factors for hepatitis C. Sexually Transmitted Diseases (STDs)  You should be screened each year for STDs including gonorrhea and chlamydia if:  You are sexually active and are younger than 65 years of age.  You are older than 65 years of age and your health care provider tells you that you are at risk for this type of infection.  Your sexual activity has changed since you were last screened and you are at an increased risk for chlamydia or gonorrhea. Ask your health  care provider if you are at risk.  Talk with your health care provider about whether you are at high risk of being infected with HIV. Your health care provider may recommend a prescription medicine to help prevent HIV infection. What else can I do?  Schedule regular health, dental, and eye exams.  Stay current with your vaccines (immunizations).  Do not use any tobacco products, such as cigarettes, chewing tobacco, and e-cigarettes. If you need help quitting, ask your health care provider.  Limit alcohol intake to no more than 2 drinks per day. One drink equals 12 ounces of beer, 5 ounces of wine, or 1 ounces of hard liquor.  Do not use street drugs.  Do not share needles.  Ask your health care provider for help if you need support or information about quitting drugs.  Tell your health care provider if you often feel depressed.  Tell your health care provider if you have ever been abused or do not feel safe at home. This information  is not intended to replace advice given to you by your health care provider. Make sure you discuss any questions you have with your health care provider. Document Released: 06/07/2008 Document Revised: 08/08/2016 Document Reviewed: 09/13/2015 Elsevier Interactive Patient Education  2017 [employer].

## 2017-03-18 NOTE — Assessment & Plan Note (Signed)
Having GERD symptoms with taking omeprazole 40 mg daily Has GERD symptoms 1-2 times a week - takes Tums Start zantac 150 mg a night

## 2017-04-01 ENCOUNTER — Other Ambulatory Visit (INDEPENDENT_AMBULATORY_CARE_PROVIDER_SITE_OTHER): Payer: BLUE CROSS/BLUE SHIELD

## 2017-04-01 ENCOUNTER — Other Ambulatory Visit: Payer: Self-pay | Admitting: Internal Medicine

## 2017-04-01 DIAGNOSIS — K219 Gastro-esophageal reflux disease without esophagitis: Secondary | ICD-10-CM

## 2017-04-01 DIAGNOSIS — Z0000 Encounter for general adult medical examination without abnormal findings: Secondary | ICD-10-CM | POA: Diagnosis not present

## 2017-04-01 DIAGNOSIS — E7800 Pure hypercholesterolemia, unspecified: Secondary | ICD-10-CM

## 2017-04-01 LAB — COMPREHENSIVE METABOLIC PANEL
ALT: 18 U/L (ref 0–53)
AST: 17 U/L (ref 0–37)
Albumin: 4.3 g/dL (ref 3.5–5.2)
Alkaline Phosphatase: 61 U/L (ref 39–117)
BUN: 17 mg/dL (ref 6–23)
CO2: 29 mEq/L (ref 19–32)
Calcium: 9.5 mg/dL (ref 8.4–10.5)
Chloride: 106 mEq/L (ref 96–112)
Creatinine, Ser: 0.97 mg/dL (ref 0.40–1.50)
GFR: 82.69 mL/min (ref >60.00)
Glucose, Bld: 99 mg/dL (ref 70–99)
Potassium: 4.1 mEq/L (ref 3.5–5.1)
Sodium: 141 mEq/L (ref 135–145)
Total Bilirubin: 0.5 mg/dL (ref 0.2–1.2)
Total Protein: 7.2 g/dL (ref 6.0–8.3)

## 2017-04-01 LAB — CBC WITH DIFFERENTIAL/PLATELET
Basophils Absolute: 0.1 10*3/uL (ref 0.0–0.1)
Basophils Relative: 1 % (ref 0.0–3.0)
Eosinophils Absolute: 0.1 10*3/uL (ref 0.0–0.7)
Eosinophils Relative: 2.3 % (ref 0.0–5.0)
HCT: 42.7 % (ref 39.0–52.0)
Hemoglobin: 14.9 g/dL (ref 13.0–17.0)
Lymphocytes Relative: 25.5 % (ref 12.0–46.0)
Lymphs Abs: 1.5 10*3/uL (ref 0.7–4.0)
MCHC: 34.9 g/dL (ref 30.0–36.0)
MCV: 84.3 fl (ref 78.0–100.0)
Monocytes Absolute: 0.5 10*3/uL (ref 0.1–1.0)
Monocytes Relative: 8.2 % (ref 3.0–12.0)
Neutro Abs: 3.6 10*3/uL (ref 1.4–7.7)
Neutrophils Relative %: 63 % (ref 43.0–77.0)
Platelets: 282 10*3/uL (ref 150.0–400.0)
RBC: 5.07 Mil/uL (ref 4.22–5.81)
RDW: 12.5 % (ref 11.5–15.5)
WBC: 5.7 10*3/uL (ref 4.0–10.5)

## 2017-04-01 LAB — LIPID PANEL
Cholesterol: 185 mg/dL (ref 0–200)
HDL: 33.9 mg/dL — ABNORMAL LOW (ref >39.00)
LDL Cholesterol: 126 mg/dL — ABNORMAL HIGH (ref 0–99)
NonHDL: 150.7
Total CHOL/HDL Ratio: 5
Triglycerides: 126 mg/dL (ref 0.0–149.0)
VLDL: 25.2 mg/dL (ref 0.0–40.0)

## 2017-04-01 LAB — VITAMIN B12: Vitamin B-12: 566 pg/mL (ref 211–911)

## 2017-04-01 LAB — MAGNESIUM: Magnesium: 2.1 mg/dL (ref 1.5–2.5)

## 2017-04-01 LAB — VITAMIN D 25 HYDROXY (VIT D DEFICIENCY, FRACTURES): VITD: 29.79 ng/mL — ABNORMAL LOW (ref 30.00–100.00)

## 2017-04-01 LAB — TSH: TSH: 1.48 u[IU]/mL (ref 0.35–4.50)

## 2017-04-02 LAB — PSA, TOTAL AND FREE
PSA, Free: <0.1 ng/mL
PSA, Total: <0.1 ng/mL (ref <=4.0)

## 2017-04-07 ENCOUNTER — Encounter: Payer: Self-pay | Admitting: Internal Medicine

## 2017-04-18 DIAGNOSIS — D3132 Benign neoplasm of left choroid: Secondary | ICD-10-CM | POA: Diagnosis not present

## 2017-06-18 DIAGNOSIS — M25561 Pain in right knee: Secondary | ICD-10-CM | POA: Diagnosis not present

## 2017-06-22 DIAGNOSIS — M25561 Pain in right knee: Secondary | ICD-10-CM | POA: Diagnosis not present

## 2017-06-27 DIAGNOSIS — M25561 Pain in right knee: Secondary | ICD-10-CM | POA: Diagnosis not present

## 2017-07-03 DIAGNOSIS — M1711 Unilateral primary osteoarthritis, right knee: Secondary | ICD-10-CM | POA: Diagnosis not present

## 2017-07-03 DIAGNOSIS — S83281A Other tear of lateral meniscus, current injury, right knee, initial encounter: Secondary | ICD-10-CM | POA: Diagnosis not present

## 2017-07-03 DIAGNOSIS — Y999 Unspecified external cause status: Secondary | ICD-10-CM | POA: Diagnosis not present

## 2017-07-03 DIAGNOSIS — G8918 Other acute postprocedural pain: Secondary | ICD-10-CM | POA: Diagnosis not present

## 2017-07-03 DIAGNOSIS — S83241A Other tear of medial meniscus, current injury, right knee, initial encounter: Secondary | ICD-10-CM | POA: Diagnosis not present

## 2017-07-03 DIAGNOSIS — M6751 Plica syndrome, right knee: Secondary | ICD-10-CM | POA: Diagnosis not present

## 2017-07-03 DIAGNOSIS — M94261 Chondromalacia, right knee: Secondary | ICD-10-CM | POA: Diagnosis not present

## 2017-07-03 DIAGNOSIS — S83231A Complex tear of medial meniscus, current injury, right knee, initial encounter: Secondary | ICD-10-CM | POA: Diagnosis not present

## 2017-07-08 DIAGNOSIS — M23322 Other meniscus derangements, posterior horn of medial meniscus, left knee: Secondary | ICD-10-CM | POA: Diagnosis not present

## 2017-07-08 DIAGNOSIS — M25561 Pain in right knee: Secondary | ICD-10-CM | POA: Diagnosis not present

## 2017-07-08 DIAGNOSIS — M1711 Unilateral primary osteoarthritis, right knee: Secondary | ICD-10-CM | POA: Diagnosis not present

## 2017-07-09 ENCOUNTER — Other Ambulatory Visit: Payer: Self-pay | Admitting: Internal Medicine

## 2017-07-09 DIAGNOSIS — M6751 Plica syndrome, right knee: Secondary | ICD-10-CM | POA: Diagnosis not present

## 2017-07-15 DIAGNOSIS — M23322 Other meniscus derangements, posterior horn of medial meniscus, left knee: Secondary | ICD-10-CM | POA: Diagnosis not present

## 2017-07-15 DIAGNOSIS — M25561 Pain in right knee: Secondary | ICD-10-CM | POA: Diagnosis not present

## 2017-07-15 DIAGNOSIS — M1711 Unilateral primary osteoarthritis, right knee: Secondary | ICD-10-CM | POA: Diagnosis not present

## 2017-07-17 DIAGNOSIS — M23322 Other meniscus derangements, posterior horn of medial meniscus, left knee: Secondary | ICD-10-CM | POA: Diagnosis not present

## 2017-07-17 DIAGNOSIS — M1711 Unilateral primary osteoarthritis, right knee: Secondary | ICD-10-CM | POA: Diagnosis not present

## 2017-07-17 DIAGNOSIS — M25561 Pain in right knee: Secondary | ICD-10-CM | POA: Diagnosis not present

## 2017-07-22 DIAGNOSIS — M25561 Pain in right knee: Secondary | ICD-10-CM | POA: Diagnosis not present

## 2017-07-22 DIAGNOSIS — M1711 Unilateral primary osteoarthritis, right knee: Secondary | ICD-10-CM | POA: Diagnosis not present

## 2017-07-22 DIAGNOSIS — M23322 Other meniscus derangements, posterior horn of medial meniscus, left knee: Secondary | ICD-10-CM | POA: Diagnosis not present

## 2017-07-24 DIAGNOSIS — M23322 Other meniscus derangements, posterior horn of medial meniscus, left knee: Secondary | ICD-10-CM | POA: Diagnosis not present

## 2017-07-24 DIAGNOSIS — M1711 Unilateral primary osteoarthritis, right knee: Secondary | ICD-10-CM | POA: Diagnosis not present

## 2017-07-24 DIAGNOSIS — M25561 Pain in right knee: Secondary | ICD-10-CM | POA: Diagnosis not present

## 2017-07-29 DIAGNOSIS — M25561 Pain in right knee: Secondary | ICD-10-CM | POA: Diagnosis not present

## 2017-07-29 DIAGNOSIS — M23322 Other meniscus derangements, posterior horn of medial meniscus, left knee: Secondary | ICD-10-CM | POA: Diagnosis not present

## 2017-07-29 DIAGNOSIS — M1711 Unilateral primary osteoarthritis, right knee: Secondary | ICD-10-CM | POA: Diagnosis not present

## 2017-07-30 DIAGNOSIS — M1711 Unilateral primary osteoarthritis, right knee: Secondary | ICD-10-CM | POA: Diagnosis not present

## 2017-08-09 DIAGNOSIS — M1711 Unilateral primary osteoarthritis, right knee: Secondary | ICD-10-CM | POA: Diagnosis not present

## 2017-09-09 DIAGNOSIS — M1711 Unilateral primary osteoarthritis, right knee: Secondary | ICD-10-CM | POA: Diagnosis not present

## 2017-10-02 DIAGNOSIS — Z23 Encounter for immunization: Secondary | ICD-10-CM | POA: Diagnosis not present

## 2017-10-21 DIAGNOSIS — D3132 Benign neoplasm of left choroid: Secondary | ICD-10-CM | POA: Diagnosis not present

## 2017-10-21 DIAGNOSIS — H53482 Generalized contraction of visual field, left eye: Secondary | ICD-10-CM | POA: Diagnosis not present

## 2018-02-02 ENCOUNTER — Other Ambulatory Visit: Payer: Self-pay | Admitting: Internal Medicine

## 2018-04-22 NOTE — Progress Notes (Signed)
Subjective:    Patient ID: Gregory Santiago, male    DOB: 05-Oct-1951, 67 y.o.   MRN: [id number]  HPI He is here for a physical exam.   Since he was here last he had a crown put in (November 2018) and has some mild palsy on the right side of his mouth.  He is not able to whistle and get a good seal around his mouth. It did improve -he was initially biting the area.  He has full sensation and can smile.  He did f/u with the dentist.  He did have his mensicus tear repaired.  He still has some knee pain b/l from arthritis and follows with ortho.  He takes mobic prn only.    Medications and allergies reviewed with patient and updated if appropriate.  Patient Active Problem List   Diagnosis Date Noted  . Knee pain 04/23/2018  . 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 GERD (gastroesophageal reflux disease)   . HLD (hyperlipidemia)   . Allergic rhinitis     Current Outpatient Medications on File Prior to Visit  Medication Sig Dispense Refill  . cyclobenzaprine (FLEXERIL) 5 MG tablet Take 1 tablet (5 mg total) by mouth 3 (three) times daily as needed for muscle spasms. 30 tablet 3  . fluticasone (FLONASE) 50 MCG/ACT nasal spray Place 2 sprays into both nostrils daily.    . hydrocortisone (ANUSOL-HC) 2.5 % rectal cream Place 1 application rectally 2 (two) times daily as needed for hemorrhoids or itching. Keep on file - does not need filled now 30 g 5  . meloxicam (MOBIC) 15 MG tablet Take 15 mg by mouth daily as needed for pain.    Marland Kitchen omeprazole (PRILOSEC) 40 MG capsule TAKE ONE CAPSULE BY MOUTH DAILY-- Office visit needed for further refills 90 capsule 0   No current facility-administered medications on file prior to visit.     Past Medical History:  Diagnosis Date  . Allergic rhinitis   . Duodenitis   . Fundic gland polyps of stomach, benign   . GERD (gastroesophageal reflux disease)   . Hemorrhoids   . Hepatitis A   . HLD (hyperlipidemia)     Past Surgical History:  Procedure  Laterality Date  . arm surgery     left, ligaments and tendon repair, accident  . NASAL SEPTUM SURGERY      Social History   Socioeconomic History  . Marital status: Married    Spouse name: Not on file  . Number of children: 3  . Years of education: Not on file  . Highest education level: Not on file  Occupational History  . Occupation: Programmer, systems: [employer]  . Financial resource strain: Not on file  . Food insecurity:    Worry: Not on file    Inability: Not on file  . Transportation needs:    Medical: Not on file    Non-medical: Not on file  Tobacco Use  . Smoking status: Former Smoker    Years: 5.00    Types: Cigarettes    Last attempt to quit: 12/24/1982    Years since quitting: 35.3  . Smokeless tobacco: Never Used  Substance and Sexual Activity  . Alcohol use: Yes    Comment: social  . Drug use: No  . Sexual activity: Not on file  Lifestyle  . Physical activity:    Days per week: Not on file    Minutes per session: Not on file  .  Stress: Not on file  Relationships  . Social connections:    Talks on phone: Not on file    Gets together: Not on file    Attends religious service: Not on file    Active member of club or organization: Not on file    Attends meetings of clubs or organizations: Not on file    Relationship status: Not on file  Other Topics Concern  . Not on file  Social History Narrative   Exercise: not currently running    Family History  Problem Relation Age of Onset  . Prostate cancer Father   . Arthritis Father   . Colon polyps Mother   . Diabetes Mother   . Parkinson's disease Mother   . Arthritis Mother     Review of Systems  Constitutional: Negative for chills, fatigue and fever.  Eyes: Negative for visual disturbance.  Respiratory: Negative for cough, shortness of breath and wheezing.   Cardiovascular: Negative for chest pain, palpitations and leg swelling.  Gastrointestinal: Negative for  abdominal pain, blood in stool, constipation, diarrhea and nausea.  Genitourinary: Negative for difficulty urinating, dysuria and hematuria.  Musculoskeletal: Positive for arthralgias. Negative for back pain.  Skin: Negative for color change and rash.  Neurological: Negative for light-headedness and headaches.  Psychiatric/Behavioral: Negative for dysphoric mood and sleep disturbance. The patient is not nervous/anxious.        Objective:   Vitals:   04/23/18 0836  BP: 118/84  Pulse: (!) 58  Resp: 16  Temp: 97.6 F (36.4 C)  SpO2: 98%   Filed Weights   04/23/18 0836  Weight: 192 lb (87.1 kg)   Body mass index is 26.78 kg/m.  Wt Readings from Last 3 Encounters:  04/23/18 192 lb (87.1 kg)  03/18/17 204 lb (92.5 kg)  03/12/16 199 lb (90.3 kg)     Physical Exam Constitutional: He appears well-developed and well-nourished. No distress.  HENT:  Head: Normocephalic and atraumatic.  Right Ear: External ear normal.  Left Ear: External ear normal.  Mouth/Throat: Oropharynx is clear and moist.  Very mild palsy and muscle atrophy right corner of mouth Normal ear canals and TM b/l  Eyes: Conjunctivae and EOM are normal.  Neck: Neck supple. No tracheal deviation present. No thyromegaly present.  No carotid bruit  Cardiovascular: Normal rate, regular rhythm, normal heart sounds and intact distal pulses.   No murmur heard. Pulmonary/Chest: Effort normal and breath sounds normal. No respiratory distress. He has no wheezes. He has no rales.  Abdominal: Soft. He exhibits no distension. There is no tenderness.  Genitourinary: normal size prostate w/o nodules Musculoskeletal: He exhibits no edema.  Lymphadenopathy:   He has no cervical adenopathy.  Skin: Skin is warm and dry. He is not diaphoretic.  Psychiatric: He has a normal mood and affect. His behavior is normal.         Assessment & Plan:   Physical exam: Screening blood work  ordered Immunizations  prevnar today,  discussed shingles, others up to date Colonoscopy   Up to date  Eye exams   Up to date  EKG     Done 02/2016 Exercise - active at work --- no other exercise - does some stationary bike occasionally - will try to make it more regular Weight  Mildly overweight Skin   No concerns Substance abuse   none  See Problem List for Assessment and Plan of chronic medical problems.   FU in one year

## 2018-04-22 NOTE — Patient Instructions (Addendum)
Test(s) ordered today. Your results will be released to [facility] (or called to you) after review, usually within 72hours after test completion. If any changes need to be made, you will be notified at that same time.  All other Health Maintenance issues reviewed.   All recommended immunizations and age-appropriate screenings are up-to-date or discussed.  Prevnar pneumonia immunization administered today.   Medications reviewed and updated.   No changes recommended at this time.    Please followup in one year    Health Maintenance, Male A healthy lifestyle and preventive care is important for your health and wellness. Ask your health care provider about what schedule of regular examinations is right for you. What should I know about weight and diet? Eat a Healthy Diet  Eat plenty of vegetables, fruits, whole grains, low-fat dairy products, and lean protein.  Do not eat a lot of foods high in solid fats, added sugars, or salt.  Maintain a Healthy Weight Regular exercise can help you achieve or maintain a healthy weight. You should:  Do at least 150 minutes of exercise each week. The exercise should increase your heart rate and make you sweat (moderate-intensity exercise).  Do strength-training exercises at least twice a week.  Watch Your Levels of Cholesterol and Blood Lipids  Have your blood tested for lipids and cholesterol every 5 years starting at 66 years of age. If you are at high risk for heart disease, you should start having your blood tested when you are 66 years old. You may need to have your cholesterol levels checked more often if: ? Your lipid or cholesterol levels are high. ? You are older than 66 years of age. ? You are at high risk for heart disease.  What should I know about cancer screening? Many types of cancers can be detected early and may often be prevented. Lung Cancer  You should be screened every year for lung cancer if: ? You are a current smoker who  has smoked for at least 30 years. ? You are a former smoker who has quit within the past 15 years.  Talk to your health care provider about your screening options, when you should start screening, and how often you should be screened.  Colorectal Cancer  Routine colorectal cancer screening usually begins at 66 years of age and should be repeated every 5-10 years until you are 66 years old. You may need to be screened more often if early forms of precancerous polyps or small growths are found. Your health care provider may recommend screening at an earlier age if you have risk factors for colon cancer.  Your health care provider may recommend using home test kits to check for hidden blood in the stool.  A small camera at the end of a tube can be used to examine your colon (sigmoidoscopy or colonoscopy). This checks for the earliest forms of colorectal cancer.  Prostate and Testicular Cancer  Depending on your age and overall health, your health care provider may do certain tests to screen for prostate and testicular cancer.  Talk to your health care provider about any symptoms or concerns you have about testicular or prostate cancer.  Skin Cancer  Check your skin from head to toe regularly.  Tell your health care provider about any new moles or changes in moles, especially if: ? There is a change in a mole's size, shape, or color. ? You have a mole that is larger than a pencil eraser.  Always use sunscreen.  Apply sunscreen liberally and repeat throughout the day.  Protect yourself by wearing long sleeves, pants, a wide-brimmed hat, and sunglasses when outside.  What should I know about heart disease, diabetes, and high blood pressure?  If you are 85-62 years of age, have your blood pressure checked every 3-5 years. If you are 103 years of age or older, have your blood pressure checked every year. You should have your blood pressure measured twice-once when you are at a hospital or  clinic, and once when you are not at a hospital or clinic. Record the average of the two measurements. To check your blood pressure when you are not at a hospital or clinic, you can use: ? An automated blood pressure machine at a pharmacy. ? A home blood pressure monitor.  Talk to your health care provider about your target blood pressure.  If you are between 59-21 years old, ask your health care provider if you should take aspirin to prevent heart disease.  Have regular diabetes screenings by checking your fasting blood sugar level. ? If you are at a normal weight and have a low risk for diabetes, have this test once every three years after the age of 51. ? If you are overweight and have a high risk for diabetes, consider being tested at a younger age or more often.  A one-time screening for abdominal aortic aneurysm (AAA) by ultrasound is recommended for men aged 20-75 years who are current or former smokers. What should I know about preventing infection? Hepatitis B If you have a higher risk for hepatitis B, you should be screened for this virus. Talk with your health care provider to find out if you are at risk for hepatitis B infection. Hepatitis C Blood testing is recommended for:  Everyone born from [birth year] through 1964.  Anyone with known risk factors for hepatitis C.  Sexually Transmitted Diseases (STDs)  You should be screened each year for STDs including gonorrhea and chlamydia if: ? You are sexually active and are younger than 66 years of age. ? You are older than 66 years of age and your health care provider tells you that you are at risk for this type of infection. ? Your sexual activity has changed since you were last screened and you are at an increased risk for chlamydia or gonorrhea. Ask your health care provider if you are at risk.  Talk with your health care provider about whether you are at high risk of being infected with HIV. Your health care provider may recommend a  prescription medicine to help prevent HIV infection.  What else can I do?  Schedule regular health, dental, and eye exams.  Stay current with your vaccines (immunizations).  Do not use any tobacco products, such as cigarettes, chewing tobacco, and e-cigarettes. If you need help quitting, ask your health care provider.  Limit alcohol intake to no more than 2 drinks per day. One drink equals 12 ounces of beer, 5 ounces of wine, or 1 ounces of hard liquor.  Do not use street drugs.  Do not share needles.  Ask your health care provider for help if you need support or information about quitting drugs.  Tell your health care provider if you often feel depressed.  Tell your health care provider if you have ever been abused or do not feel safe at home. This information is not intended to replace advice given to you by your health care provider. Make sure you discuss any questions you have  with your health care provider. Document Released: 06/07/2008 Document Revised: 08/08/2016 Document Reviewed: 09/13/2015 Elsevier Interactive Patient Education  [employer].

## 2018-04-23 ENCOUNTER — Ambulatory Visit (INDEPENDENT_AMBULATORY_CARE_PROVIDER_SITE_OTHER): Payer: BLUE CROSS/BLUE SHIELD | Admitting: Internal Medicine

## 2018-04-23 ENCOUNTER — Other Ambulatory Visit (INDEPENDENT_AMBULATORY_CARE_PROVIDER_SITE_OTHER): Payer: BLUE CROSS/BLUE SHIELD

## 2018-04-23 ENCOUNTER — Encounter: Payer: Self-pay | Admitting: Internal Medicine

## 2018-04-23 VITALS — BP 118/84 | HR 58 | Temp 97.6°F | Resp 16 | Ht 71.0 in | Wt 192.0 lb

## 2018-04-23 DIAGNOSIS — Z0001 Encounter for general adult medical examination with abnormal findings: Secondary | ICD-10-CM

## 2018-04-23 DIAGNOSIS — M25561 Pain in right knee: Secondary | ICD-10-CM

## 2018-04-23 DIAGNOSIS — K219 Gastro-esophageal reflux disease without esophagitis: Secondary | ICD-10-CM

## 2018-04-23 DIAGNOSIS — E782 Mixed hyperlipidemia: Secondary | ICD-10-CM

## 2018-04-23 DIAGNOSIS — Z125 Encounter for screening for malignant neoplasm of prostate: Secondary | ICD-10-CM | POA: Diagnosis not present

## 2018-04-23 DIAGNOSIS — G8929 Other chronic pain: Secondary | ICD-10-CM | POA: Diagnosis not present

## 2018-04-23 DIAGNOSIS — Z23 Encounter for immunization: Secondary | ICD-10-CM

## 2018-04-23 DIAGNOSIS — G510 Bell's palsy: Secondary | ICD-10-CM | POA: Insufficient documentation

## 2018-04-23 DIAGNOSIS — M25569 Pain in unspecified knee: Secondary | ICD-10-CM | POA: Insufficient documentation

## 2018-04-23 HISTORY — DX: Pain in unspecified knee: M25.569

## 2018-04-23 LAB — COMPREHENSIVE METABOLIC PANEL
ALT: 17 U/L (ref 0–53)
AST: 18 U/L (ref 0–37)
Albumin: 4.4 g/dL (ref 3.5–5.2)
Alkaline Phosphatase: 52 U/L (ref 39–117)
BUN: 17 mg/dL (ref 6–23)
CO2: 28 mEq/L (ref 19–32)
Calcium: 9.7 mg/dL (ref 8.4–10.5)
Chloride: 105 mEq/L (ref 96–112)
Creatinine, Ser: 0.96 mg/dL (ref 0.40–1.50)
GFR: 83.4 mL/min (ref >60.00)
Glucose, Bld: 97 mg/dL (ref 70–99)
Potassium: 4.8 mEq/L (ref 3.5–5.1)
Sodium: 139 mEq/L (ref 135–145)
Total Bilirubin: 0.7 mg/dL (ref 0.2–1.2)
Total Protein: 7 g/dL (ref 6.0–8.3)

## 2018-04-23 LAB — CBC WITH DIFFERENTIAL/PLATELET
Basophils Absolute: 0 10*3/uL (ref 0.0–0.1)
Basophils Relative: 1 % (ref 0.0–3.0)
Eosinophils Absolute: 0.1 10*3/uL (ref 0.0–0.7)
Eosinophils Relative: 3.2 % (ref 0.0–5.0)
HCT: 44 % (ref 39.0–52.0)
Hemoglobin: 15.1 g/dL (ref 13.0–17.0)
Lymphocytes Relative: 37.4 % (ref 12.0–46.0)
Lymphs Abs: 1.7 10*3/uL (ref 0.7–4.0)
MCHC: 34.3 g/dL (ref 30.0–36.0)
MCV: 87.1 fl (ref 78.0–100.0)
Monocytes Absolute: 0.5 10*3/uL (ref 0.1–1.0)
Monocytes Relative: 12.3 % — ABNORMAL HIGH (ref 3.0–12.0)
Neutro Abs: 2 10*3/uL (ref 1.4–7.7)
Neutrophils Relative %: 46.1 % (ref 43.0–77.0)
Platelets: 254 10*3/uL (ref 150.0–400.0)
RBC: 5.05 Mil/uL (ref 4.22–5.81)
RDW: 12.4 % (ref 11.5–15.5)
WBC: 4.4 10*3/uL (ref 4.0–10.5)

## 2018-04-23 LAB — TSH: TSH: 1.22 u[IU]/mL (ref 0.35–4.50)

## 2018-04-23 LAB — LIPID PANEL
Cholesterol: 190 mg/dL (ref 0–200)
HDL: 41.7 mg/dL (ref >39.00)
LDL Cholesterol: 131 mg/dL — ABNORMAL HIGH (ref 0–99)
NonHDL: 148.7
Total CHOL/HDL Ratio: 5
Triglycerides: 91 mg/dL (ref 0.0–149.0)
VLDL: 18.2 mg/dL (ref 0.0–40.0)

## 2018-04-23 MED ORDER — SIMVASTATIN 10 MG PO TABS: 10.0000 mg | ORAL_TABLET | Freq: Every day | ORAL | 3 refills | Status: DC

## 2018-04-23 NOTE — Assessment & Plan Note (Signed)
Check lipid panel  Continue daily statin Regular exercise and healthy diet encouraged  

## 2018-04-23 NOTE — Assessment & Plan Note (Signed)
GERD controlled Continue daily medication  

## 2018-04-23 NOTE — Assessment & Plan Note (Addendum)
S/p R meniscus repair 2018 Still with pain from arthritis - takes meloxicam prn only

## 2018-04-23 NOTE — Assessment & Plan Note (Signed)
Mild palsy corner of right mouth No loss of sensation Related to dental work - occurred just after a prolonged dental procedure - no other concerning symptoms to suggest other cause Some improvement since it occurred

## 2018-04-24 ENCOUNTER — Encounter: Payer: Self-pay | Admitting: Internal Medicine

## 2018-04-24 LAB — PSA, TOTAL AND FREE
PSA, % Free: UNDETERMINED % (calc) (ref >25)
PSA, Free: <0.1 ng/mL
PSA, Total: <0.1 ng/mL (ref <=4.0)

## 2018-04-26 ENCOUNTER — Other Ambulatory Visit: Payer: Self-pay | Admitting: Internal Medicine

## 2018-07-03 ENCOUNTER — Ambulatory Visit (INDEPENDENT_AMBULATORY_CARE_PROVIDER_SITE_OTHER): Payer: BLUE CROSS/BLUE SHIELD | Admitting: *Deleted

## 2018-07-03 DIAGNOSIS — Z23 Encounter for immunization: Secondary | ICD-10-CM

## 2018-08-10 NOTE — Progress Notes (Signed)
Subjective:    Patient ID: Gregory Santiago, male    DOB: 10-23-1951, 67 y.o.   MRN: [id number]  HPI The patient is here for an acute visit.  He was here in May of this year for a physical exam at that time he mentioned right-sided facial palsy symptoms that dated back to November 2018.  It started after dental work-he had a crown put in.  He states it did improve.  He had full sensation at that time and had followed up with his dentist.  Since that time the pulse he has gotten worse, which is why he is here today.  He is not able to smile appropriately due to weakness in the right cheek and mouth.  Sipping something can be difficult because he can pull out of his mouth.  At times he does have slurred speech.  He has no numbness/tingling particularly on the bottom part of his right face along his bottom jaw.  He is twitching of the muscles on the right side of his face, especially near his eye.   He denies any numbness or tingling in his tongue.  He has no difficulty swallowing or eating.  He states that may be some possible changes in his vision-blurry vision in the right eye comes and goes.  He does have a lot of watering of the eye and at times it feels dry.  Occasionally he wakes up with numbness and pain in the right posterior neck and head in the area feels tight.  He is unsure if this was related to the above or not.    He has been having some generalized headaches, which is new.  He denies any lightheadedness or dizziness.   He does not recall any viral illnesses when this started or tick bites.  He has some aches and pains, but relates that to his work which is physical and getting older.  Medications and allergies reviewed with patient and updated if appropriate.  Patient Active Problem List   Diagnosis Date Noted  . Knee pain 04/23/2018  . 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 GERD (gastroesophageal reflux disease)   . HLD (hyperlipidemia)   . Allergic rhinitis      Current Outpatient Medications on File Prior to Visit  Medication Sig Dispense Refill  . cyclobenzaprine (FLEXERIL) 5 MG tablet Take 1 tablet (5 mg total) by mouth 3 (three) times daily as needed for muscle spasms. 30 tablet 3  . fluticasone (FLONASE) 50 MCG/ACT nasal spray Place 2 sprays into both nostrils daily.    . hydrocortisone (ANUSOL-HC) 2.5 % rectal cream Place 1 application rectally 2 (two) times daily as needed for hemorrhoids or itching. Keep on file - does not need filled now 30 g 5  . meloxicam (MOBIC) 15 MG tablet Take 15 mg by mouth daily as needed for pain.    Marland Kitchen omeprazole (PRILOSEC) 40 MG capsule TAKE 1 CAPSULE BY MOUTH DAILY 90 capsule 3  . simvastatin (ZOCOR) 10 MG tablet TAKE 1 TABLET BY MOUTH DAILY 90 tablet 3   No current facility-administered medications on file prior to visit.     Past Medical History:  Diagnosis Date  . Allergic rhinitis   . Duodenitis   . Fundic gland polyps of stomach, benign   . GERD (gastroesophageal reflux disease)   . Hemorrhoids   . Hepatitis A   . HLD (hyperlipidemia)     Past Surgical History:  Procedure Laterality Date  . arm surgery  left, ligaments and tendon repair, accident  . NASAL SEPTUM SURGERY      Social History   Socioeconomic History  . Marital status: Married    Spouse name: Not on file  . Number of children: 3  . Years of education: Not on file  . Highest education level: Not on file  Occupational History  . Occupation: Programmer, systems: [employer]  . Financial resource strain: Not on file  . Food insecurity:    Worry: Not on file    Inability: Not on file  . Transportation needs:    Medical: Not on file    Non-medical: Not on file  Tobacco Use  . Smoking status: Former Smoker    Years: 5.00    Types: Cigarettes    Last attempt to quit: 12/24/1982    Years since quitting: 35.6  . Smokeless tobacco: Never Used  Substance and Sexual Activity  . Alcohol use: Yes     Comment: social  . Drug use: No  . Sexual activity: Not on file  Lifestyle  . Physical activity:    Days per week: Not on file    Minutes per session: Not on file  . Stress: Not on file  Relationships  . Social connections:    Talks on phone: Not on file    Gets together: Not on file    Attends religious service: Not on file    Active member of club or organization: Not on file    Attends meetings of clubs or organizations: Not on file    Relationship status: Not on file  Other Topics Concern  . Not on file  Social History Narrative   Exercise: not currently running    Family History  Problem Relation Age of Onset  . Prostate cancer Father   . Arthritis Father   . Colon polyps Mother   . Diabetes Mother   . Parkinson's disease Mother   . Arthritis Mother     Review of Systems  Constitutional: Positive for fatigue. Negative for chills and fever.  HENT: Negative for ear pain, hearing loss, tinnitus and trouble swallowing.   Eyes: Positive for discharge (right tearing) and visual disturbance (? blurriness in right eye).  Respiratory: Negative for cough, shortness of breath and wheezing.   Cardiovascular: Negative for chest pain.  Musculoskeletal: Positive for arthralgias (knees, shoulders). Negative for myalgias.  Skin: Negative for rash.  Neurological: Positive for facial asymmetry, speech difficulty (at times), weakness (right face), numbness and headaches (generalized). Negative for dizziness and light-headedness.       Objective:   Vitals:   08/11/18 0832  BP: 122/80  Pulse: 66  Resp: 16  Temp: 97.8 F (36.6 C)  SpO2: 95%   BP Readings from Last 3 Encounters:  08/11/18 122/80  04/23/18 118/84  03/18/17 110/80   Wt Readings from Last 3 Encounters:  08/11/18 178 lb (80.7 kg)  04/23/18 192 lb (87.1 kg)  03/18/17 204 lb (92.5 kg)   Body mass index is 24.83 kg/m.   Physical Exam  Constitutional: He is oriented to person, place, and time. He appears  well-developed and well-nourished. No distress.  HENT:  Head: Normocephalic and atraumatic.  Right Ear: External ear normal.  Left Ear: External ear normal.  Nose: Nose normal.  Mouth/Throat: Oropharynx is clear and moist.  Bilateral ear canals and tympanic membranes normal  Neck: Normal range of motion. Neck supple. No tracheal deviation present. No thyromegaly present.  Lymphadenopathy:  He has no cervical adenopathy.  Neurological: He is alert and oriented to person, place, and time. A sensory deficit (mild decreased sensation right lower face) is present.  Right sided facial nerve weakness related to smiling, puffing cheek out, closing eye completely, blinking  Skin: Skin is warm and dry. He is not diaphoretic.           Assessment & Plan:    See Problem List for Assessment and Plan of chronic medical problems.

## 2018-08-11 ENCOUNTER — Encounter: Payer: Self-pay | Admitting: Internal Medicine

## 2018-08-11 ENCOUNTER — Other Ambulatory Visit: Payer: BLUE CROSS/BLUE SHIELD

## 2018-08-11 ENCOUNTER — Ambulatory Visit (INDEPENDENT_AMBULATORY_CARE_PROVIDER_SITE_OTHER): Payer: BLUE CROSS/BLUE SHIELD | Admitting: Internal Medicine

## 2018-08-11 VITALS — BP 122/80 | HR 66 | Temp 97.8°F | Resp 16 | Ht 71.0 in | Wt 178.0 lb

## 2018-08-11 DIAGNOSIS — R29818 Other symptoms and signs involving the nervous system: Secondary | ICD-10-CM | POA: Diagnosis not present

## 2018-08-11 DIAGNOSIS — G510 Bell's palsy: Secondary | ICD-10-CM

## 2018-08-11 MED ORDER — MELOXICAM 15 MG PO TABS: 15.0000 mg | ORAL_TABLET | Freq: Every day | ORAL | 1 refills | Status: DC | PRN

## 2018-08-11 NOTE — Patient Instructions (Signed)
An MRI was ordered - someone will call you to schedule this.   Have blood work done today.

## 2018-08-11 NOTE — Assessment & Plan Note (Signed)
Started in November 2018 after dental work-he had a crown put him and he assumed it was a cause.  Initially he felt this got better, but in the last few months it has gotten worse MRI of brain with, without contrast We will check Lyme, HSV Discussed starting steroids, but he deferred for now-he would like to see the results of the MRI first We will hold off on referral to neurology/neurosurgery depending on results of MRI

## 2018-08-12 LAB — B. BURGDORFI ANTIBODIES: B burgdorferi Ab IgG+IgM: <0.9 index

## 2018-08-12 LAB — HSV 2 ANTIBODY, IGG: HSV 2 Glycoprotein G Ab, IgG: <0.9 index

## 2018-08-12 LAB — HSV 1 ANTIBODY, IGG: HSV 1 Glycoprotein G Ab, IgG: 13.3 index — ABNORMAL HIGH

## 2018-08-13 DIAGNOSIS — R29818 Other symptoms and signs involving the nervous system: Secondary | ICD-10-CM | POA: Diagnosis not present

## 2018-08-13 DIAGNOSIS — G510 Bell's palsy: Secondary | ICD-10-CM | POA: Diagnosis not present

## 2018-08-13 DIAGNOSIS — H538 Other visual disturbances: Secondary | ICD-10-CM | POA: Diagnosis not present

## 2018-08-14 ENCOUNTER — Encounter: Payer: Self-pay | Admitting: Internal Medicine

## 2018-08-14 ENCOUNTER — Telehealth: Payer: Self-pay | Admitting: Internal Medicine

## 2018-08-14 DIAGNOSIS — R894 Abnormal immunological findings in specimens from other organs, systems and tissues: Secondary | ICD-10-CM | POA: Insufficient documentation

## 2018-08-14 DIAGNOSIS — G510 Bell's palsy: Secondary | ICD-10-CM

## 2018-08-14 HISTORY — DX: Abnormal immunological findings in specimens from other organs, systems and tissues: R89.4

## 2018-08-14 NOTE — Telephone Encounter (Signed)
MRI shows no masses of structural abnormalities of brain of facial nerve.  There is inflammation of the nerve, which confirms Bells Palsy.    His test for lyme disease is negative.  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    I would like him to see a neurologist.  I can refer him to Dr Jaynee Eagles if he likes.    We can try steroids and an anti-viral medication to see if it helps - it may or may not since he has had symptoms for so long.  I think it is worth trying the medication to see if it helps or reduces the progression.   I also want him to start using lubricating eye drops and see his eye doctor for other advise regarding protecting his eye since it is not closing completely.     I have referred him to neurology.  Let me now if he wants to try the steroids and anti-viral.

## 2018-08-15 MED ORDER — VALACYCLOVIR HCL 1 G PO TABS: 1000.0000 mg | ORAL_TABLET | Freq: Three times a day (TID) | ORAL | 0 refills | Status: DC

## 2018-08-15 MED ORDER — PREDNISONE 20 MG PO TABS: 60.0000 mg | ORAL_TABLET | Freq: Every day | ORAL | 0 refills | Status: AC

## 2018-08-15 NOTE — Telephone Encounter (Signed)
Pt would like to do the steroid and anti-viral.

## 2018-08-15 NOTE — Telephone Encounter (Signed)
Referral ordered for Neuro.  Let me know about the mediation.

## 2018-08-15 NOTE — Telephone Encounter (Signed)
Patient is returning call from [facility] regarding his test results.  Patient would like a call back.  CB# [phone].

## 2018-08-15 NOTE — Telephone Encounter (Signed)
Pt aware of results below. Will call back after he talks to his wife in regards to trying the steroid and anti-viral. Also would like a referral to Dr. Jaynee Eagles.

## 2018-08-15 NOTE — Telephone Encounter (Signed)
sent 

## 2018-08-16 ENCOUNTER — Encounter: Payer: Self-pay | Admitting: Internal Medicine

## 2018-08-17 NOTE — Progress Notes (Signed)
Subjective:    Patient ID: Gregory Santiago, male    DOB: [birth year], 67 y.o.   MRN: [id number]  HPI The patient is here for follow up of Bells Palsy.  He is here with his wife and they would like to review the MRI results and discuss his diagnosis of Bell's palsy.  Last Friday he did start the Valtrex and denies any side effects.  He started the prednisone yesterday and denies any side effects.  We reviewed the etiology of Bell's palsy and some treatment options.  His Lyme test was negative.  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  He denies ever having any cold sores or oral ulcers.  Because of symptoms of Bell's palsy has been going on for months it is difficult to know if this is a new infection or old.  Most likely this is not the cause.  We reviewed his MRI.  They do have a copy of the MRI.  He denies any major changes in his symptoms since he was here last.  His symptoms did start back in November of last year and it started just a few days after extensive dental work.  Medications and allergies reviewed with patient and updated if appropriate.  Patient Active Problem List   Diagnosis Date Noted  . 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 Knee pain 04/23/2018  . Bell's palsy 04/23/2018  . GERD (gastroesophageal reflux disease)   . HLD (hyperlipidemia)   . Allergic rhinitis     Current Outpatient Medications on File Prior to Visit  Medication Sig Dispense Refill  . cyclobenzaprine (FLEXERIL) 5 MG tablet Take 1 tablet (5 mg total) by mouth 3 (three) times daily as needed for muscle spasms. 30 tablet 3  . fluticasone (FLONASE) 50 MCG/ACT nasal spray Place 2 sprays into both nostrils daily.    . hydrocortisone (ANUSOL-HC) 2.5 % rectal cream Place 1 application rectally 2 (two) times daily as needed for hemorrhoids or itching. Keep on file - does not need filled now 30 g 5  . meloxicam (MOBIC) 15 MG tablet Take 1 tablet (15 mg total) by mouth daily as needed for pain. 90 tablet 1  .  omeprazole (PRILOSEC) 40 MG capsule TAKE 1 CAPSULE BY MOUTH DAILY 90 capsule 3  . predniSONE (DELTASONE) 20 MG tablet Take 3 tablets (60 mg total) by mouth daily with breakfast for 7 days. 21 tablet 0  . simvastatin (ZOCOR) 10 MG tablet TAKE 1 TABLET BY MOUTH DAILY 90 tablet 3  . valACYclovir (VALTREX) 1000 MG tablet Take 1 tablet (1,000 mg total) by mouth 3 (three) times daily. 21 tablet 0   No current facility-administered medications on file prior to visit.     Past Medical History:  Diagnosis Date  . Allergic rhinitis   . Duodenitis   . Fundic gland polyps of stomach, benign   . GERD (gastroesophageal reflux disease)   . Hemorrhoids   . Hepatitis A   . HLD (hyperlipidemia)     Past Surgical History:  Procedure Laterality Date  . arm surgery     left, ligaments and tendon repair, accident  . NASAL SEPTUM SURGERY      Social History   Socioeconomic History  . Marital status: Married    Spouse name: Not on file  . Number of children: 3  . Years of education: Not on file  . Highest education level: Not on file  Occupational History  . Occupation: Programmer, systems: [employer]  Social Needs  . Financial resource strain: Not on file  . Food insecurity:    Worry: Not on file    Inability: Not on file  . Transportation needs:    Medical: Not on file    Non-medical: Not on file  Tobacco Use  . Smoking status: Former Smoker    Years: 5.00    Types: Cigarettes    Last attempt to quit: 12/24/1982    Years since quitting: 35.6  . Smokeless tobacco: Never Used  Substance and Sexual Activity  . Alcohol use: Yes    Comment: social  . Drug use: No  . Sexual activity: Not on file  Lifestyle  . Physical activity:    Days per week: Not on file    Minutes per session: Not on file  . Stress: Not on file  Relationships  . Social connections:    Talks on phone: Not on file    Gets together: Not on file    Attends religious service: Not on file    Active member  of club or organization: Not on file    Attends meetings of clubs or organizations: Not on file    Relationship status: Not on file  Other Topics Concern  . Not on file  Social History Narrative   Exercise: not currently running    Family History  Problem Relation Age of Onset  . Prostate cancer Father   . Arthritis Father   . Colon polyps Mother   . Diabetes Mother   . Parkinson's disease Mother   . Arthritis Mother     Review of Systems     Objective:   Vitals:   08/19/18 0836  BP: 122/84  Pulse: 68  Resp: 16  Temp: 98.3 F (36.8 C)  SpO2: 96%   BP Readings from Last 3 Encounters:  08/19/18 122/84  08/11/18 122/80  04/23/18 118/84   Wt Readings from Last 3 Encounters:  08/19/18 191 lb 12.8 oz (87 kg)  08/11/18 178 lb (80.7 kg)  04/23/18 192 lb (87.1 kg)   Body mass index is 26.75 kg/m.   Physical Exam         Assessment & Plan:    15 minutes were spent face-to-face with the patient and his wife, over 50% of which was spent counseling regarding his new diagnosis of Bell's palsy including causes, treatment and reviewing his most recent blood work and MRI results.    See Problem List for Assessment and Plan of chronic medical problems.

## 2018-08-19 ENCOUNTER — Ambulatory Visit (INDEPENDENT_AMBULATORY_CARE_PROVIDER_SITE_OTHER): Payer: BLUE CROSS/BLUE SHIELD | Admitting: Internal Medicine

## 2018-08-19 ENCOUNTER — Encounter: Payer: Self-pay | Admitting: Internal Medicine

## 2018-08-19 VITALS — BP 122/84 | HR 68 | Temp 98.3°F | Resp 16 | Ht 71.0 in | Wt 191.8 lb

## 2018-08-19 DIAGNOSIS — G510 Bell's palsy: Secondary | ICD-10-CM

## 2018-08-19 NOTE — Patient Instructions (Signed)
  Medications reviewed and updated.  No changes recommended at this time.    A referral was ordered for physical therapy

## 2018-08-19 NOTE — Assessment & Plan Note (Signed)
MRI reviewed.  Blood work reviewed. Most likely the cause of his Bell's palsy is trauma from dental work. His HSV 1 is positive, but most likely this is not the cause.  Lyme test negative Given that his symptoms have gotten a little bit worse we did decide to true a trial of empiric prednisone and Valtrex MRI showed no concerning lesions He has a neurology appointment in [place] did request to see Dr. Lavell Anchors.  He will be seen sooner if there is a cancellation.  Discussed that we can try to get him in with a different neurologist sooner, but he deferred He will let me know if his symptoms change with the current treatment or worsen

## 2018-09-02 ENCOUNTER — Encounter: Payer: Self-pay | Admitting: Internal Medicine

## 2018-09-16 DIAGNOSIS — L570 Actinic keratosis: Secondary | ICD-10-CM | POA: Diagnosis not present

## 2018-09-17 ENCOUNTER — Ambulatory Visit (INDEPENDENT_AMBULATORY_CARE_PROVIDER_SITE_OTHER): Payer: BLUE CROSS/BLUE SHIELD | Admitting: Neurology

## 2018-09-17 ENCOUNTER — Encounter: Payer: Self-pay | Admitting: Neurology

## 2018-09-17 VITALS — BP 127/84 | HR 59 | Ht 71.0 in | Wt 187.0 lb

## 2018-09-17 DIAGNOSIS — R221 Localized swelling, mass and lump, neck: Secondary | ICD-10-CM

## 2018-09-17 DIAGNOSIS — G510 Bell's palsy: Secondary | ICD-10-CM | POA: Insufficient documentation

## 2018-09-17 DIAGNOSIS — G519 Disorder of facial nerve, unspecified: Secondary | ICD-10-CM

## 2018-09-17 DIAGNOSIS — R29810 Facial weakness: Secondary | ICD-10-CM | POA: Diagnosis not present

## 2018-09-17 DIAGNOSIS — R471 Dysarthria and anarthria: Secondary | ICD-10-CM | POA: Diagnosis not present

## 2018-09-17 MED ORDER — PREDNISONE 20 MG PO TABS: ORAL_TABLET | ORAL | 3 refills | Status: DC

## 2018-09-17 NOTE — Patient Instructions (Signed)
60mg  for 7 days 40mg   7 days 20mg  for 7 days 10mg  for 7 days  Schedule MRI brain for 6 weeks  Physical Therapy   Bell Palsy, Adult Bell palsy is a short-term inability to move muscles in part of the face. The inability to move (paralysis) results from inflammation or compression of the facial nerve, which travels along the skull and under the ear to the side of the face (7th cranial nerve). This nerve is responsible for facial movements that include blinking, closing the eyes, smiling, and frowning. What are the causes? The exact cause of this condition is not known. It may be caused by an infection from a virus, such as the chickenpox (herpes zoster), Epstein-Barr, or mumps virus. What increases the risk? You are more likely to develop this condition if:  You are pregnant.  You have diabetes.  You have had a recent infection in your nose, throat, or airways (upper respiratory infection).  You have a weakened body defense system (immune system).  You have had a facial injury, such as a fracture.  You have a family history of Bell palsy.  What are the signs or symptoms? Symptoms of this condition include:  Weakness on one side of the face.  Drooping eyelid and corner of the mouth.  Excessive tearing in one eye.  Difficulty closing the eyelid.  Dry eye.  Drooling.  Dry mouth.  Changes in taste.  Change in facial appearance.  Pain behind one ear.  Ringing in one or both ears.  Sensitivity to sound in one ear.  Facial twitching.  Headache.  Impaired speech.  Dizziness.  Difficulty eating or drinking.  Most of the time, only one side of the face is affected. Rarely, Bell palsy affects the whole face. How is this diagnosed? This condition is diagnosed based on:  Your symptoms.  Your medical history.  A physical exam.  You may also have to see health care providers who specialize in disorders of the nerves (neurologist) or diseases and conditions  of the eye (ophthalmologist). You may have tests, such as:  A test to check for nerve damage (electromyogram).  Imaging studies, such as CT or MRI scans.  Blood tests.  How is this treated? This condition affects every person differently. Sometimes symptoms go away without treatment within a couple weeks. If treatment is needed, it varies from person to person. The goal of treatment is to reduce inflammation and protect the eye from damage. Treatment for Bell palsy may include:  Medicines, such as: ? Steroids to reduce swelling and inflammation. ? Antiviral drugs. ? Pain relievers, including aspirin, acetaminophen, or ibuprofen.  Eye drops or ointment to keep your eye moist.  Eye protection, if you cannot close your eye.  Exercises or massage to regain muscle strength and function (physical therapy).  Follow these instructions at home:  Take over-the-counter and prescription medicines only as told by your health care provider.  If your eye is affected: ? Keep your eye moist with eye drops or ointment as told by your health care provider. ? Follow instructions for eye care and protection as told by your health care provider.  Do any physical therapy exercises as told by your health care provider.  Keep all follow-up visits as told by your health care provider. This is important. Contact a health care provider if:  You have a fever.  Your symptoms do not get better within 2-3 weeks, or your symptoms get worse.  Your eye is red, irritated,  or painful.  You have new symptoms. Get help right away if:  You have weakness or numbness in a part of your body other than your face.  You have trouble swallowing.  You develop neck pain or stiffness.  You develop dizziness or shortness of breath. Summary  Bell palsy is a short-term inability to move muscles in part of the face. The inability to move (paralysis) results from inflammation or compression of the facial  nerve.  This condition affects every person differently. Sometimes symptoms go away without treatment within a couple weeks.  If treatment is needed, it varies from person to person. The goal of treatment is to reduce inflammation and protect the eye from damage.  Contact your health care provider if your symptoms do not get better within 2-3 weeks, or your symptoms get worse. This information is not intended to replace advice given to you by your health care provider. Make sure you discuss any questions you have with your health care provider. Document Released: 12/10/2005 Document Revised: 02/12/2017 Document Reviewed: 02/12/2017 Elsevier Interactive Patient Education  [employer].

## 2018-09-17 NOTE — Progress Notes (Signed)
[id number]    Provider:  Dr Jaynee Eagles Referring Provider: Binnie Rail, MD Primary Care Physician:  Binnie Rail, MD  CC:  Bells Palsy  HPI:  Gregory Santiago is a 67 y.o. male here as requested by Dr. Quay Burow for Bells palsy. Symptoms started in November for root canal and crown on the opposite and he noticed several weeks later and he couldn't whistle. He was drooling. Facial asymmetry, not as much blinking of the eye and watering eye.  Eye watering, eye felt heavy. It was stable until March or later around May, it became worse facial droop, slurring speech and he was given steroids and antivirals. Improved on the steroids a little but after steroids it worsened. 60mg  for 7 days starting 8/23.   Reviewed notes, labs and imaging from outside physicians, which showed:  HSV1 + Lyme neg  Reviewed mri brain images on CD and agree with the following:   IMPRESSION: 1. Asymmetric avid enhancement of the right facial nerve tympanic and mastoid segments as well as inflammation of the soft tissues below stylomastoid foramen. Findings likely represent Bell's palsy. No masslike component to suggest schwannoma or hemangioma. 2. Normal MRI of the brain. 3. Mild paranasal sinus mucosal thickening.  Review of Systems: Patient complains of symptoms per HPI as well as the following symptoms facial droop. Pertinent negatives and positives per HPI. All others negative.   Social History   Socioeconomic History  . Marital status: Married    Spouse name: Not on file  . Number of children: 3  . Years of education: 1 year of college  . Highest education level: Some college, no degree  Occupational History  . 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 Financial resource strain: Not on file  . Food insecurity:    Worry: Not on file    Inability: Not on file  . Transportation needs:    Medical: Not on file    Non-medical: Not on file  Tobacco Use  .  Smoking status: Former Smoker    Years: 5.00    Types: Cigarettes    Last attempt to quit: 12/24/1982    Years since quitting: 35.7  . Smokeless tobacco: Never Used  Substance and Sexual Activity  . Alcohol use: Yes    Comment: social  . Drug use: No  . Sexual activity: Not on file  Lifestyle  . Physical activity:    Days per week: Not on file    Minutes per session: Not on file  . Stress: Not on file  Relationships  . Social connections:    Talks on phone: Not on file    Gets together: Not on file    Attends religious service: Not on file    Active member of club or organization: Not on file    Attends meetings of clubs or organizations: Not on file    Relationship status: Not on file  . Intimate partner violence:    Fear of current or ex partner: Not on file    Emotionally abused: Not on file    Physically abused: Not on file    Forced sexual activity: Not on file  Other Topics Concern  . Not on file  Social History Narrative   Exercise: not currently running   Lives at home with wife    Right handed   Caffeine: 6 cups daily    Family History  Problem Relation Age of Onset  . Prostate cancer  Father   . Arthritis Father   . Lymphoma Father   . Colon polyps Mother   . Diabetes Mother   . Parkinson's disease Mother   . Arthritis Mother   . Arthritis Paternal Grandmother   . Heart attack Paternal Grandfather   . Heart disease Paternal Grandfather     Past Medical History:  Diagnosis Date  . Allergic rhinitis   . Cranial nerve VII palsy   . Duodenitis   . Fundic gland polyps of stomach, benign   . GERD (gastroesophageal reflux disease)   . Hemorrhoids   . Hepatitis A   . HLD (hyperlipidemia)     Past Surgical History:  Procedure Laterality Date  . arm surgery     left, ligaments and tendon repair, accident  . MENISCUS REPAIR Right 2018  . NASAL SEPTUM SURGERY      Current Outpatient Medications  Medication Sig Dispense Refill  . fluticasone  (FLONASE) 50 MCG/ACT nasal spray Place 2 sprays into both nostrils daily.    Marland Kitchen omeprazole (PRILOSEC) 40 MG capsule TAKE 1 CAPSULE BY MOUTH DAILY 90 capsule 3  . simvastatin (ZOCOR) 10 MG tablet TAKE 1 TABLET BY MOUTH DAILY 90 tablet 3  . cyclobenzaprine (FLEXERIL) 5 MG tablet Take 1 tablet (5 mg total) by mouth 3 (three) times daily as needed for muscle spasms. (Patient not taking: Reported on 09/17/2018) 30 tablet 3  . hydrocortisone (ANUSOL-HC) 2.5 % rectal cream Place 1 application rectally 2 (two) times daily as needed for hemorrhoids or itching. Keep on file - does not need filled now 30 g 5  . meloxicam (MOBIC) 15 MG tablet Take 1 tablet (15 mg total) by mouth daily as needed for pain. 90 tablet 1  . predniSONE (DELTASONE) 20 MG tablet 60mg  daily for 7 days, 40mg   7 days, 20mg  for 7 days, 10 mg for 7 days. 50 tablet 3   No current facility-administered medications for this visit.     Allergies as of 09/17/2018  . (No Known Allergies)    Vitals: BP 127/84 (BP Location: Right Arm, Patient Position: Sitting)   Pulse (!) 59   Ht 5\' 11"  (1.803 m)   Wt 187 lb (84.8 kg)   BMI 26.08 kg/m  Last Weight:  Wt Readings from Last 1 Encounters:  09/17/18 187 lb (84.8 kg)   Last Height:   Ht Readings from Last 1 Encounters:  09/17/18 5\' 11"  (1.803 m)    Physical exam: Exam: Gen: NAD, conversant, well nourised, obese, well groomed                     CV: RRR, no MRG. No Carotid Bruits. No peripheral edema, warm, nontender Eyes: Conjunctivae clear without exudates or hemorrhage  Neuro: Detailed Neurologic Exam  Speech:    Speech is normal; fluent and spontaneous with normal comprehension.  Cognition:    The patient is oriented to person, place, and time;     recent and remote memory intact;     language fluent;     normal attention, concentration,     fund of knowledge Cranial Nerves:    The pupils are equal, round, and reactive to light. The fundi are normal and spontaneous  venous pulsations are present. Visual fields are full to finger confrontation. Extraocular movements are intact. Trigeminal sensation is intact and the muscles of mastication are normal. Right lower facial weakness, weakness of right eye closure and right eyebrow raise, The palate elevates in the midline. Hearing  intact. Voice is normal. Shoulder shrug is normal. The tongue has normal motion without fasciculations.   Coordination:    Normal finger to nose and heel to shin. Normal rapid alternating movements.   Gait:    Heel-toe and tandem gait are normal.   Motor Observation:    No asymmetry, no atrophy, and no involuntary movements noted. Tone:    Normal muscle tone.    Posture:    Posture is normal. normal erect    Strength:    Strength is V/V in the upper and lower limbs.      Sensation: intact to LT     Reflex Exam:  DTR's:    Deep tendon reflexes in the upper and lower extremities are normal bilaterally.   Toes:    The toes are downgoing bilaterally.   Clonus:    Clonus is absent.      Assessment/Plan:  Patient with peripheral 7th right waxing and waning since dental Surgery last November 2018.   MRI brain 07/2018: Asymmetric avid enhancement of the right facial nerve tympanic and mastoid segments as well as inflammation of the soft tissues below stylomastoid foramen. No sign of schwanoma on nerve.  Bells palsy? very unusual nerve still enhancing on MRI after almost a year of symptoms and unusual soft tissue inflammation so need to ensure no tumors or malignancies of the neck: CT soft tissue of the neck to evaluate for masses/tumors or malignancy on the right in the neck  Improved with one week steroids will try a longer taper of steroids:  60 for 7 days, 40  7 days, 20 for 7 days, 10 for 7 days  Follow up in 8 weeks  Physical therapy  MRI brain w/wo contrast with thin cuts along cranial nerve 7 in 6 weeks to see if nerve enhancement and inflammation has  improved.  Protect the right eye, discussed corneal drying risks  Orders Placed This Encounter  Procedures  . MR BRAIN W WO CONTRAST  . CT SOFT TISSUE NECK W WO CONTRAST  . Ambulatory referral to Physical Therapy   Cc: Dr. Farrel Demark, [facility] [address] [place], [place] [address]  Phone [phone] Fax [phone]

## 2018-09-18 ENCOUNTER — Telehealth: Payer: Self-pay | Admitting: Neurology

## 2018-09-18 ENCOUNTER — Other Ambulatory Visit: Payer: Self-pay | Admitting: Neurology

## 2018-09-18 ENCOUNTER — Ambulatory Visit
Admission: RE | Admit: 2018-09-18 | Discharge: 2018-09-18 | Disposition: A | Discharge status: Home / Self Care | Payer: BLUE CROSS/BLUE SHIELD | Source: Ambulatory Visit | Attending: Neurology | Admitting: Neurology

## 2018-09-18 ENCOUNTER — Encounter: Payer: Self-pay | Admitting: Radiology

## 2018-09-18 DIAGNOSIS — C479 Malignant neoplasm of peripheral nerves and autonomic nervous system, unspecified: Secondary | ICD-10-CM

## 2018-09-18 DIAGNOSIS — G519 Disorder of facial nerve, unspecified: Secondary | ICD-10-CM

## 2018-09-18 DIAGNOSIS — C07 Malignant neoplasm of parotid gland: Secondary | ICD-10-CM

## 2018-09-18 DIAGNOSIS — R221 Localized swelling, mass and lump, neck: Secondary | ICD-10-CM | POA: Diagnosis not present

## 2018-09-18 DIAGNOSIS — R29810 Facial weakness: Secondary | ICD-10-CM

## 2018-09-18 DIAGNOSIS — G510 Bell's palsy: Secondary | ICD-10-CM

## 2018-09-18 DIAGNOSIS — R471 Dysarthria and anarthria: Secondary | ICD-10-CM

## 2018-09-18 DIAGNOSIS — R591 Generalized enlarged lymph nodes: Secondary | ICD-10-CM

## 2018-09-18 MED ORDER — IOPAMIDOL (ISOVUE-300) INJECTION 61%
75.0000 mL | Freq: Once | INTRAVENOUS | Status: AC | PRN
  Administered 2018-09-18: 75 mL via INTRAVENOUS

## 2018-09-18 NOTE — Addendum Note (Signed)
Addended by: Sarina Ill B on: 09/18/2018 10:06 AM   Modules accepted: Orders

## 2018-09-18 NOTE — Telephone Encounter (Signed)
Spoke with patient and informed him that he can walk into [facility] [address] today to have his CT done (not MRI). Patient verbalized appreciation.

## 2018-09-18 NOTE — Telephone Encounter (Signed)
1st [employer] Auth: [place] via [employer] 2nd [place]: [employer] spoke to [employer]. Ref # [id number] order sent to GI. They will reach out to the pt to schedule.

## 2018-09-19 ENCOUNTER — Telehealth: Payer: Self-pay | Admitting: *Deleted

## 2018-09-19 DIAGNOSIS — D37030 Neoplasm of uncertain behavior of the parotid salivary glands: Secondary | ICD-10-CM | POA: Diagnosis not present

## 2018-09-19 DIAGNOSIS — R29810 Facial weakness: Secondary | ICD-10-CM

## 2018-09-19 DIAGNOSIS — Z87891 Personal history of nicotine dependence: Secondary | ICD-10-CM | POA: Diagnosis not present

## 2018-09-19 DIAGNOSIS — R590 Localized enlarged lymph nodes: Secondary | ICD-10-CM | POA: Diagnosis not present

## 2018-09-19 HISTORY — DX: Facial weakness: R29.810

## 2018-09-19 NOTE — Telephone Encounter (Signed)
Spoke with Gregory Santiago, pt's wife (on [place]) and informed her that Dr. Jaynee Eagles called and left a message for Dr. Wilburn Cornelia however he was in surgery. She verbalized understanding and appreciation and stated that she had spoken with his office and the patient will probably be seen this afternoon.   Dr. Jaynee Eagles aware.

## 2018-09-24 DIAGNOSIS — D37030 Neoplasm of uncertain behavior of the parotid salivary glands: Secondary | ICD-10-CM | POA: Diagnosis not present

## 2018-09-24 DIAGNOSIS — G510 Bell's palsy: Secondary | ICD-10-CM | POA: Diagnosis not present

## 2018-09-25 ENCOUNTER — Ambulatory Visit: Payer: BLUE CROSS/BLUE SHIELD | Admitting: Physical Therapy

## 2018-10-08 ENCOUNTER — Ambulatory Visit: Payer: Self-pay | Admitting: Neurology

## 2018-10-08 ENCOUNTER — Encounter

## 2018-10-08 DIAGNOSIS — D37030 Neoplasm of uncertain behavior of the parotid salivary glands: Secondary | ICD-10-CM | POA: Diagnosis not present

## 2018-10-08 DIAGNOSIS — C07 Malignant neoplasm of parotid gland: Secondary | ICD-10-CM | POA: Diagnosis not present

## 2018-10-13 ENCOUNTER — Encounter: Payer: Self-pay | Admitting: Internal Medicine

## 2018-10-14 DIAGNOSIS — C07 Malignant neoplasm of parotid gland: Secondary | ICD-10-CM | POA: Insufficient documentation

## 2018-10-14 HISTORY — DX: Malignant neoplasm of parotid gland: C07

## 2018-10-20 ENCOUNTER — Ambulatory Visit (INDEPENDENT_AMBULATORY_CARE_PROVIDER_SITE_OTHER): Payer: BLUE CROSS/BLUE SHIELD | Admitting: Emergency Medicine

## 2018-10-20 DIAGNOSIS — Z23 Encounter for immunization: Secondary | ICD-10-CM

## 2018-10-20 NOTE — Progress Notes (Signed)
Injection given.   Leinaala Catanese J Shaniece Bussa, MD  

## 2018-10-22 DIAGNOSIS — C07 Malignant neoplasm of parotid gland: Secondary | ICD-10-CM | POA: Diagnosis not present

## 2018-10-22 DIAGNOSIS — E785 Hyperlipidemia, unspecified: Secondary | ICD-10-CM | POA: Diagnosis not present

## 2018-10-22 DIAGNOSIS — Z87891 Personal history of nicotine dependence: Secondary | ICD-10-CM | POA: Diagnosis not present

## 2018-10-22 DIAGNOSIS — J320 Chronic maxillary sinusitis: Secondary | ICD-10-CM | POA: Diagnosis not present

## 2018-10-22 DIAGNOSIS — D492 Neoplasm of unspecified behavior of bone, soft tissue, and skin: Secondary | ICD-10-CM | POA: Diagnosis not present

## 2018-10-22 DIAGNOSIS — K118 Other diseases of salivary glands: Secondary | ICD-10-CM | POA: Diagnosis not present

## 2018-10-22 DIAGNOSIS — I2510 Atherosclerotic heart disease of native coronary artery without angina pectoris: Secondary | ICD-10-CM | POA: Diagnosis not present

## 2018-10-28 DIAGNOSIS — J309 Allergic rhinitis, unspecified: Secondary | ICD-10-CM | POA: Insufficient documentation

## 2018-10-28 DIAGNOSIS — C07 Malignant neoplasm of parotid gland: Secondary | ICD-10-CM | POA: Diagnosis not present

## 2018-10-28 DIAGNOSIS — Z87891 Personal history of nicotine dependence: Secondary | ICD-10-CM | POA: Diagnosis not present

## 2018-10-28 HISTORY — DX: Allergic rhinitis, unspecified: J30.9

## 2018-10-29 ENCOUNTER — Ambulatory Visit: Payer: BLUE CROSS/BLUE SHIELD

## 2018-11-03 DIAGNOSIS — C07 Malignant neoplasm of parotid gland: Secondary | ICD-10-CM | POA: Diagnosis not present

## 2018-11-03 DIAGNOSIS — H903 Sensorineural hearing loss, bilateral: Secondary | ICD-10-CM | POA: Diagnosis not present

## 2018-11-07 DIAGNOSIS — C7951 Secondary malignant neoplasm of bone: Secondary | ICD-10-CM | POA: Diagnosis not present

## 2018-11-07 DIAGNOSIS — C770 Secondary and unspecified malignant neoplasm of lymph nodes of head, face and neck: Secondary | ICD-10-CM | POA: Diagnosis not present

## 2018-11-07 DIAGNOSIS — C7949 Secondary malignant neoplasm of other parts of nervous system: Secondary | ICD-10-CM | POA: Diagnosis not present

## 2018-11-07 DIAGNOSIS — C7259 Malignant neoplasm of other cranial nerves: Secondary | ICD-10-CM | POA: Diagnosis not present

## 2018-11-07 DIAGNOSIS — Z87891 Personal history of nicotine dependence: Secondary | ICD-10-CM | POA: Diagnosis not present

## 2018-11-07 DIAGNOSIS — R531 Weakness: Secondary | ICD-10-CM | POA: Diagnosis not present

## 2018-11-07 DIAGNOSIS — K219 Gastro-esophageal reflux disease without esophagitis: Secondary | ICD-10-CM | POA: Diagnosis not present

## 2018-11-07 DIAGNOSIS — G510 Bell's palsy: Secondary | ICD-10-CM | POA: Diagnosis not present

## 2018-11-07 DIAGNOSIS — C07 Malignant neoplasm of parotid gland: Secondary | ICD-10-CM | POA: Diagnosis not present

## 2018-11-07 DIAGNOSIS — E785 Hyperlipidemia, unspecified: Secondary | ICD-10-CM | POA: Diagnosis not present

## 2018-11-12 DIAGNOSIS — H02103 Unspecified ectropion of right eye, unspecified eyelid: Secondary | ICD-10-CM | POA: Diagnosis not present

## 2018-11-12 DIAGNOSIS — H02231 Paralytic lagophthalmos right upper eyelid: Secondary | ICD-10-CM

## 2018-11-12 DIAGNOSIS — H57811 Brow ptosis, right: Secondary | ICD-10-CM | POA: Diagnosis not present

## 2018-11-12 DIAGNOSIS — G510 Bell's palsy: Secondary | ICD-10-CM | POA: Diagnosis not present

## 2018-11-12 DIAGNOSIS — H02152 Paralytic ectropion of right lower eyelid: Secondary | ICD-10-CM

## 2018-11-12 HISTORY — DX: Paralytic lagophthalmos right upper eyelid: H02.231

## 2018-11-12 HISTORY — DX: Paralytic ectropion of right lower eyelid: H02.152

## 2018-11-18 ENCOUNTER — Ambulatory Visit: Payer: BLUE CROSS/BLUE SHIELD | Admitting: Neurology

## 2018-11-23 HISTORY — PX: EYE SURGERY: SHX253

## 2018-11-28 DIAGNOSIS — G510 Bell's palsy: Secondary | ICD-10-CM | POA: Diagnosis not present

## 2018-11-28 DIAGNOSIS — H02231 Paralytic lagophthalmos right upper eyelid: Secondary | ICD-10-CM | POA: Diagnosis not present

## 2018-11-28 DIAGNOSIS — G8389 Other specified paralytic syndromes: Secondary | ICD-10-CM | POA: Diagnosis not present

## 2018-11-28 DIAGNOSIS — H02152 Paralytic ectropion of right lower eyelid: Secondary | ICD-10-CM | POA: Diagnosis not present

## 2018-11-28 DIAGNOSIS — H57811 Brow ptosis, right: Secondary | ICD-10-CM | POA: Diagnosis not present

## 2018-11-30 ENCOUNTER — Encounter: Payer: Self-pay | Admitting: Internal Medicine

## 2018-11-30 DIAGNOSIS — C089 Malignant neoplasm of major salivary gland, unspecified: Secondary | ICD-10-CM

## 2018-11-30 HISTORY — DX: Malignant neoplasm of major salivary gland, unspecified: C08.9

## 2018-12-01 DIAGNOSIS — C07 Malignant neoplasm of parotid gland: Secondary | ICD-10-CM | POA: Diagnosis not present

## 2018-12-03 DIAGNOSIS — C07 Malignant neoplasm of parotid gland: Secondary | ICD-10-CM | POA: Diagnosis not present

## 2018-12-08 DIAGNOSIS — R5383 Other fatigue: Secondary | ICD-10-CM | POA: Diagnosis not present

## 2018-12-08 DIAGNOSIS — R1312 Dysphagia, oropharyngeal phase: Secondary | ICD-10-CM | POA: Diagnosis not present

## 2018-12-08 DIAGNOSIS — C07 Malignant neoplasm of parotid gland: Secondary | ICD-10-CM | POA: Diagnosis not present

## 2018-12-11 DIAGNOSIS — C07 Malignant neoplasm of parotid gland: Secondary | ICD-10-CM | POA: Diagnosis not present

## 2018-12-12 DIAGNOSIS — C07 Malignant neoplasm of parotid gland: Secondary | ICD-10-CM | POA: Diagnosis not present

## 2018-12-14 ENCOUNTER — Encounter: Payer: Self-pay | Admitting: Gastroenterology

## 2018-12-15 DIAGNOSIS — Z5111 Encounter for antineoplastic chemotherapy: Secondary | ICD-10-CM | POA: Diagnosis not present

## 2018-12-15 DIAGNOSIS — C07 Malignant neoplasm of parotid gland: Secondary | ICD-10-CM | POA: Diagnosis not present

## 2018-12-15 DIAGNOSIS — Z79899 Other long term (current) drug therapy: Secondary | ICD-10-CM | POA: Diagnosis not present

## 2018-12-15 DIAGNOSIS — Z510 Encounter for antineoplastic radiation therapy: Secondary | ICD-10-CM | POA: Diagnosis not present

## 2018-12-15 DIAGNOSIS — Z87891 Personal history of nicotine dependence: Secondary | ICD-10-CM | POA: Diagnosis not present

## 2018-12-16 DIAGNOSIS — Z87891 Personal history of nicotine dependence: Secondary | ICD-10-CM | POA: Diagnosis not present

## 2018-12-16 DIAGNOSIS — Z510 Encounter for antineoplastic radiation therapy: Secondary | ICD-10-CM | POA: Diagnosis not present

## 2018-12-16 DIAGNOSIS — Z79899 Other long term (current) drug therapy: Secondary | ICD-10-CM | POA: Diagnosis not present

## 2018-12-16 DIAGNOSIS — C07 Malignant neoplasm of parotid gland: Secondary | ICD-10-CM | POA: Diagnosis not present

## 2018-12-16 DIAGNOSIS — Z5111 Encounter for antineoplastic chemotherapy: Secondary | ICD-10-CM | POA: Diagnosis not present

## 2018-12-18 DIAGNOSIS — C07 Malignant neoplasm of parotid gland: Secondary | ICD-10-CM | POA: Diagnosis not present

## 2018-12-18 DIAGNOSIS — Z510 Encounter for antineoplastic radiation therapy: Secondary | ICD-10-CM | POA: Diagnosis not present

## 2018-12-19 DIAGNOSIS — R5383 Other fatigue: Secondary | ICD-10-CM | POA: Diagnosis not present

## 2018-12-19 DIAGNOSIS — Z510 Encounter for antineoplastic radiation therapy: Secondary | ICD-10-CM | POA: Diagnosis not present

## 2018-12-19 DIAGNOSIS — C07 Malignant neoplasm of parotid gland: Secondary | ICD-10-CM | POA: Diagnosis not present

## 2018-12-22 DIAGNOSIS — Z510 Encounter for antineoplastic radiation therapy: Secondary | ICD-10-CM | POA: Diagnosis not present

## 2018-12-22 DIAGNOSIS — C07 Malignant neoplasm of parotid gland: Secondary | ICD-10-CM | POA: Diagnosis not present

## 2018-12-23 DIAGNOSIS — Z79899 Other long term (current) drug therapy: Secondary | ICD-10-CM | POA: Diagnosis not present

## 2018-12-23 DIAGNOSIS — Z510 Encounter for antineoplastic radiation therapy: Secondary | ICD-10-CM | POA: Diagnosis not present

## 2018-12-23 DIAGNOSIS — C07 Malignant neoplasm of parotid gland: Secondary | ICD-10-CM | POA: Diagnosis not present

## 2018-12-23 DIAGNOSIS — Z87891 Personal history of nicotine dependence: Secondary | ICD-10-CM | POA: Diagnosis not present

## 2018-12-25 DIAGNOSIS — C07 Malignant neoplasm of parotid gland: Secondary | ICD-10-CM | POA: Diagnosis not present

## 2018-12-25 DIAGNOSIS — Z510 Encounter for antineoplastic radiation therapy: Secondary | ICD-10-CM | POA: Diagnosis not present

## 2018-12-26 DIAGNOSIS — C07 Malignant neoplasm of parotid gland: Secondary | ICD-10-CM | POA: Diagnosis not present

## 2018-12-26 DIAGNOSIS — Z510 Encounter for antineoplastic radiation therapy: Secondary | ICD-10-CM | POA: Diagnosis not present

## 2018-12-29 DIAGNOSIS — Z510 Encounter for antineoplastic radiation therapy: Secondary | ICD-10-CM | POA: Diagnosis not present

## 2018-12-29 DIAGNOSIS — C07 Malignant neoplasm of parotid gland: Secondary | ICD-10-CM | POA: Diagnosis not present

## 2018-12-30 DIAGNOSIS — Z510 Encounter for antineoplastic radiation therapy: Secondary | ICD-10-CM | POA: Diagnosis not present

## 2018-12-30 DIAGNOSIS — Z9889 Other specified postprocedural states: Secondary | ICD-10-CM | POA: Diagnosis not present

## 2018-12-30 DIAGNOSIS — Z79899 Other long term (current) drug therapy: Secondary | ICD-10-CM | POA: Diagnosis not present

## 2018-12-30 DIAGNOSIS — C07 Malignant neoplasm of parotid gland: Secondary | ICD-10-CM | POA: Diagnosis not present

## 2018-12-31 DIAGNOSIS — Z510 Encounter for antineoplastic radiation therapy: Secondary | ICD-10-CM | POA: Diagnosis not present

## 2018-12-31 DIAGNOSIS — C07 Malignant neoplasm of parotid gland: Secondary | ICD-10-CM | POA: Diagnosis not present

## 2019-01-01 DIAGNOSIS — Z510 Encounter for antineoplastic radiation therapy: Secondary | ICD-10-CM | POA: Diagnosis not present

## 2019-01-01 DIAGNOSIS — C07 Malignant neoplasm of parotid gland: Secondary | ICD-10-CM | POA: Diagnosis not present

## 2019-01-02 DIAGNOSIS — Z510 Encounter for antineoplastic radiation therapy: Secondary | ICD-10-CM | POA: Diagnosis not present

## 2019-01-02 DIAGNOSIS — C07 Malignant neoplasm of parotid gland: Secondary | ICD-10-CM | POA: Diagnosis not present

## 2019-01-05 DIAGNOSIS — C07 Malignant neoplasm of parotid gland: Secondary | ICD-10-CM | POA: Diagnosis not present

## 2019-01-05 DIAGNOSIS — R1312 Dysphagia, oropharyngeal phase: Secondary | ICD-10-CM | POA: Diagnosis not present

## 2019-01-05 DIAGNOSIS — Z510 Encounter for antineoplastic radiation therapy: Secondary | ICD-10-CM | POA: Diagnosis not present

## 2019-01-06 DIAGNOSIS — Z9889 Other specified postprocedural states: Secondary | ICD-10-CM | POA: Diagnosis not present

## 2019-01-06 DIAGNOSIS — Z79899 Other long term (current) drug therapy: Secondary | ICD-10-CM | POA: Diagnosis not present

## 2019-01-06 DIAGNOSIS — Z510 Encounter for antineoplastic radiation therapy: Secondary | ICD-10-CM | POA: Diagnosis not present

## 2019-01-06 DIAGNOSIS — C07 Malignant neoplasm of parotid gland: Secondary | ICD-10-CM | POA: Diagnosis not present

## 2019-01-06 DIAGNOSIS — Z87891 Personal history of nicotine dependence: Secondary | ICD-10-CM | POA: Diagnosis not present

## 2019-01-07 DIAGNOSIS — Z510 Encounter for antineoplastic radiation therapy: Secondary | ICD-10-CM | POA: Diagnosis not present

## 2019-01-07 DIAGNOSIS — C07 Malignant neoplasm of parotid gland: Secondary | ICD-10-CM | POA: Diagnosis not present

## 2019-01-08 DIAGNOSIS — Z510 Encounter for antineoplastic radiation therapy: Secondary | ICD-10-CM | POA: Diagnosis not present

## 2019-01-08 DIAGNOSIS — C07 Malignant neoplasm of parotid gland: Secondary | ICD-10-CM | POA: Diagnosis not present

## 2019-01-09 DIAGNOSIS — Z510 Encounter for antineoplastic radiation therapy: Secondary | ICD-10-CM | POA: Diagnosis not present

## 2019-01-09 DIAGNOSIS — C07 Malignant neoplasm of parotid gland: Secondary | ICD-10-CM | POA: Diagnosis not present

## 2019-01-13 DIAGNOSIS — X58XXXA Exposure to other specified factors, initial encounter: Secondary | ICD-10-CM | POA: Diagnosis not present

## 2019-01-13 DIAGNOSIS — Z79899 Other long term (current) drug therapy: Secondary | ICD-10-CM | POA: Diagnosis not present

## 2019-01-13 DIAGNOSIS — Z87891 Personal history of nicotine dependence: Secondary | ICD-10-CM | POA: Diagnosis not present

## 2019-01-13 DIAGNOSIS — Z9889 Other specified postprocedural states: Secondary | ICD-10-CM | POA: Diagnosis not present

## 2019-01-13 DIAGNOSIS — Z510 Encounter for antineoplastic radiation therapy: Secondary | ICD-10-CM | POA: Diagnosis not present

## 2019-01-13 DIAGNOSIS — K1231 Oral mucositis (ulcerative) due to antineoplastic therapy: Secondary | ICD-10-CM | POA: Diagnosis not present

## 2019-01-13 DIAGNOSIS — Z791 Long term (current) use of non-steroidal anti-inflammatories (NSAID): Secondary | ICD-10-CM | POA: Diagnosis not present

## 2019-01-13 DIAGNOSIS — K1230 Oral mucositis (ulcerative), unspecified: Secondary | ICD-10-CM | POA: Diagnosis not present

## 2019-01-13 DIAGNOSIS — C07 Malignant neoplasm of parotid gland: Secondary | ICD-10-CM | POA: Diagnosis not present

## 2019-01-13 DIAGNOSIS — T451X5A Adverse effect of antineoplastic and immunosuppressive drugs, initial encounter: Secondary | ICD-10-CM | POA: Diagnosis not present

## 2019-01-13 DIAGNOSIS — J392 Other diseases of pharynx: Secondary | ICD-10-CM | POA: Diagnosis not present

## 2019-01-14 DIAGNOSIS — Z510 Encounter for antineoplastic radiation therapy: Secondary | ICD-10-CM | POA: Diagnosis not present

## 2019-01-14 DIAGNOSIS — C07 Malignant neoplasm of parotid gland: Secondary | ICD-10-CM | POA: Diagnosis not present

## 2019-01-15 DIAGNOSIS — C07 Malignant neoplasm of parotid gland: Secondary | ICD-10-CM | POA: Diagnosis not present

## 2019-01-15 DIAGNOSIS — Z510 Encounter for antineoplastic radiation therapy: Secondary | ICD-10-CM | POA: Diagnosis not present

## 2019-01-16 DIAGNOSIS — C07 Malignant neoplasm of parotid gland: Secondary | ICD-10-CM | POA: Diagnosis not present

## 2019-01-16 DIAGNOSIS — Z510 Encounter for antineoplastic radiation therapy: Secondary | ICD-10-CM | POA: Diagnosis not present

## 2019-01-19 DIAGNOSIS — C07 Malignant neoplasm of parotid gland: Secondary | ICD-10-CM | POA: Diagnosis not present

## 2019-01-19 DIAGNOSIS — Z510 Encounter for antineoplastic radiation therapy: Secondary | ICD-10-CM | POA: Diagnosis not present

## 2019-01-20 DIAGNOSIS — K1230 Oral mucositis (ulcerative), unspecified: Secondary | ICD-10-CM | POA: Diagnosis not present

## 2019-01-20 DIAGNOSIS — J029 Acute pharyngitis, unspecified: Secondary | ICD-10-CM | POA: Diagnosis not present

## 2019-01-20 DIAGNOSIS — C07 Malignant neoplasm of parotid gland: Secondary | ICD-10-CM | POA: Diagnosis not present

## 2019-01-20 DIAGNOSIS — Z9889 Other specified postprocedural states: Secondary | ICD-10-CM | POA: Diagnosis not present

## 2019-01-20 DIAGNOSIS — Z510 Encounter for antineoplastic radiation therapy: Secondary | ICD-10-CM | POA: Diagnosis not present

## 2019-01-20 DIAGNOSIS — Z79899 Other long term (current) drug therapy: Secondary | ICD-10-CM | POA: Diagnosis not present

## 2019-01-20 DIAGNOSIS — Z791 Long term (current) use of non-steroidal anti-inflammatories (NSAID): Secondary | ICD-10-CM | POA: Diagnosis not present

## 2019-01-21 DIAGNOSIS — Z510 Encounter for antineoplastic radiation therapy: Secondary | ICD-10-CM | POA: Diagnosis not present

## 2019-01-21 DIAGNOSIS — C07 Malignant neoplasm of parotid gland: Secondary | ICD-10-CM | POA: Diagnosis not present

## 2019-01-22 DIAGNOSIS — Z510 Encounter for antineoplastic radiation therapy: Secondary | ICD-10-CM | POA: Diagnosis not present

## 2019-01-22 DIAGNOSIS — C07 Malignant neoplasm of parotid gland: Secondary | ICD-10-CM | POA: Diagnosis not present

## 2019-01-23 DIAGNOSIS — C07 Malignant neoplasm of parotid gland: Secondary | ICD-10-CM | POA: Diagnosis not present

## 2019-01-23 DIAGNOSIS — Z510 Encounter for antineoplastic radiation therapy: Secondary | ICD-10-CM | POA: Diagnosis not present

## 2019-01-26 DIAGNOSIS — C07 Malignant neoplasm of parotid gland: Secondary | ICD-10-CM | POA: Diagnosis not present

## 2019-01-26 DIAGNOSIS — R1312 Dysphagia, oropharyngeal phase: Secondary | ICD-10-CM | POA: Diagnosis not present

## 2019-01-26 DIAGNOSIS — Z510 Encounter for antineoplastic radiation therapy: Secondary | ICD-10-CM | POA: Diagnosis not present

## 2019-01-27 DIAGNOSIS — Z791 Long term (current) use of non-steroidal anti-inflammatories (NSAID): Secondary | ICD-10-CM | POA: Diagnosis not present

## 2019-01-27 DIAGNOSIS — K1230 Oral mucositis (ulcerative), unspecified: Secondary | ICD-10-CM | POA: Diagnosis not present

## 2019-01-27 DIAGNOSIS — C07 Malignant neoplasm of parotid gland: Secondary | ICD-10-CM | POA: Diagnosis not present

## 2019-01-27 DIAGNOSIS — J029 Acute pharyngitis, unspecified: Secondary | ICD-10-CM | POA: Diagnosis not present

## 2019-01-27 DIAGNOSIS — Z87891 Personal history of nicotine dependence: Secondary | ICD-10-CM | POA: Diagnosis not present

## 2019-01-27 DIAGNOSIS — Z510 Encounter for antineoplastic radiation therapy: Secondary | ICD-10-CM | POA: Diagnosis not present

## 2019-01-27 DIAGNOSIS — Z79899 Other long term (current) drug therapy: Secondary | ICD-10-CM | POA: Diagnosis not present

## 2019-01-27 DIAGNOSIS — T451X5A Adverse effect of antineoplastic and immunosuppressive drugs, initial encounter: Secondary | ICD-10-CM | POA: Diagnosis not present

## 2019-01-27 DIAGNOSIS — L309 Dermatitis, unspecified: Secondary | ICD-10-CM | POA: Diagnosis not present

## 2019-01-27 DIAGNOSIS — E785 Hyperlipidemia, unspecified: Secondary | ICD-10-CM | POA: Diagnosis not present

## 2019-01-27 DIAGNOSIS — K1231 Oral mucositis (ulcerative) due to antineoplastic therapy: Secondary | ICD-10-CM | POA: Diagnosis not present

## 2019-01-27 DIAGNOSIS — K219 Gastro-esophageal reflux disease without esophagitis: Secondary | ICD-10-CM | POA: Diagnosis not present

## 2019-01-27 DIAGNOSIS — Z9889 Other specified postprocedural states: Secondary | ICD-10-CM | POA: Diagnosis not present

## 2019-01-28 DIAGNOSIS — C07 Malignant neoplasm of parotid gland: Secondary | ICD-10-CM | POA: Diagnosis not present

## 2019-01-28 DIAGNOSIS — Z510 Encounter for antineoplastic radiation therapy: Secondary | ICD-10-CM | POA: Diagnosis not present

## 2019-01-29 DIAGNOSIS — C07 Malignant neoplasm of parotid gland: Secondary | ICD-10-CM | POA: Diagnosis not present

## 2019-01-29 DIAGNOSIS — Z510 Encounter for antineoplastic radiation therapy: Secondary | ICD-10-CM | POA: Diagnosis not present

## 2019-01-30 DIAGNOSIS — C07 Malignant neoplasm of parotid gland: Secondary | ICD-10-CM | POA: Diagnosis not present

## 2019-01-30 DIAGNOSIS — Z510 Encounter for antineoplastic radiation therapy: Secondary | ICD-10-CM | POA: Diagnosis not present

## 2019-02-02 DIAGNOSIS — Z510 Encounter for antineoplastic radiation therapy: Secondary | ICD-10-CM | POA: Diagnosis not present

## 2019-02-02 DIAGNOSIS — C07 Malignant neoplasm of parotid gland: Secondary | ICD-10-CM | POA: Diagnosis not present

## 2019-02-03 DIAGNOSIS — Z87891 Personal history of nicotine dependence: Secondary | ICD-10-CM | POA: Diagnosis not present

## 2019-02-03 DIAGNOSIS — K1231 Oral mucositis (ulcerative) due to antineoplastic therapy: Secondary | ICD-10-CM | POA: Diagnosis not present

## 2019-02-03 DIAGNOSIS — Z792 Long term (current) use of antibiotics: Secondary | ICD-10-CM | POA: Diagnosis not present

## 2019-02-03 DIAGNOSIS — J329 Chronic sinusitis, unspecified: Secondary | ICD-10-CM | POA: Diagnosis not present

## 2019-02-03 DIAGNOSIS — C07 Malignant neoplasm of parotid gland: Secondary | ICD-10-CM | POA: Diagnosis not present

## 2019-02-03 DIAGNOSIS — Z79899 Other long term (current) drug therapy: Secondary | ICD-10-CM | POA: Diagnosis not present

## 2019-02-03 DIAGNOSIS — T451X5A Adverse effect of antineoplastic and immunosuppressive drugs, initial encounter: Secondary | ICD-10-CM | POA: Diagnosis not present

## 2019-02-03 DIAGNOSIS — K1230 Oral mucositis (ulcerative), unspecified: Secondary | ICD-10-CM | POA: Diagnosis not present

## 2019-02-03 DIAGNOSIS — R5383 Other fatigue: Secondary | ICD-10-CM | POA: Diagnosis not present

## 2019-02-03 DIAGNOSIS — R1312 Dysphagia, oropharyngeal phase: Secondary | ICD-10-CM | POA: Diagnosis not present

## 2019-02-10 DIAGNOSIS — Z9889 Other specified postprocedural states: Secondary | ICD-10-CM | POA: Diagnosis not present

## 2019-02-10 DIAGNOSIS — C07 Malignant neoplasm of parotid gland: Secondary | ICD-10-CM | POA: Diagnosis not present

## 2019-02-10 DIAGNOSIS — R5383 Other fatigue: Secondary | ICD-10-CM | POA: Diagnosis not present

## 2019-02-10 DIAGNOSIS — Z87891 Personal history of nicotine dependence: Secondary | ICD-10-CM | POA: Diagnosis not present

## 2019-02-10 DIAGNOSIS — R1312 Dysphagia, oropharyngeal phase: Secondary | ICD-10-CM | POA: Diagnosis not present

## 2019-02-10 DIAGNOSIS — K1231 Oral mucositis (ulcerative) due to antineoplastic therapy: Secondary | ICD-10-CM | POA: Diagnosis not present

## 2019-02-10 DIAGNOSIS — J329 Chronic sinusitis, unspecified: Secondary | ICD-10-CM | POA: Diagnosis not present

## 2019-02-10 DIAGNOSIS — Z792 Long term (current) use of antibiotics: Secondary | ICD-10-CM | POA: Diagnosis not present

## 2019-02-10 DIAGNOSIS — J029 Acute pharyngitis, unspecified: Secondary | ICD-10-CM | POA: Diagnosis not present

## 2019-02-10 DIAGNOSIS — T451X5A Adverse effect of antineoplastic and immunosuppressive drugs, initial encounter: Secondary | ICD-10-CM | POA: Diagnosis not present

## 2019-02-10 DIAGNOSIS — K1230 Oral mucositis (ulcerative), unspecified: Secondary | ICD-10-CM | POA: Diagnosis not present

## 2019-02-10 DIAGNOSIS — Z79899 Other long term (current) drug therapy: Secondary | ICD-10-CM | POA: Diagnosis not present

## 2019-02-26 DIAGNOSIS — R1312 Dysphagia, oropharyngeal phase: Secondary | ICD-10-CM | POA: Diagnosis not present

## 2019-02-26 NOTE — Progress Notes (Signed)
Subjective:    Patient ID: Gregory Santiago, male    DOB: 1951-10-09, 68 y.o.   MRN: [id number]  HPI The patient is here for an acute visit.  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  This was followed by chemotherapy and radiation.  He still has swelling and radiation Ronnie Mallette in his right neck and near his ear.  Right ear pressure: For a while he has been feeling some pressure in his right ear.  It is intermittent and it feels blocked.  He is unsure if this is related to earwax or the surgery/radiation.  He does have decreased hearing, which significantly worsened after radiation.  He does have an appointment with his ENT in 3 weeks, but wondered if the earwax was contributing to his symptoms and wanted to know if he could clean it out so that he could see if they provided some relief.  Right I irritation, mild redness and drainage: This started a couple of weeks ago.  The drainage and irritation did improve with using Visine and refresh eyedrops.  The surgery resulted in worsening of his Bell's palsy and he is not able to completely close his right eye.  He does use an ointment at night.  He does periodically try to close his eye, but has to close his left eye at the same time to get it closed.  The eye feels like there is grit or sand in it and he was not sure if there was an infection or if it is related to the dryness.    Medications and allergies reviewed with patient and updated if appropriate.  Patient Active Problem List   Diagnosis Date Noted  . 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 Knee pain 04/23/2018  . Bell's palsy 04/23/2018  . GERD (gastroesophageal reflux disease)   . HLD (hyperlipidemia)   . Allergic rhinitis     Current Outpatient Medications on File Prior to Visit  Medication Sig Dispense Refill  . cyclobenzaprine (FLEXERIL) 5 MG tablet Take 1  tablet (5 mg total) by mouth 3 (three) times daily as needed for muscle spasms. 30 tablet 3  . fluticasone (FLONASE) 50 MCG/ACT nasal spray Place 2 sprays into both nostrils daily.    . hydrocortisone (ANUSOL-HC) 2.5 % rectal cream Place 1 application rectally 2 (two) times daily as needed for hemorrhoids or itching. Keep on file - does not need filled now 30 g 5  . omeprazole (PRILOSEC) 40 MG capsule TAKE 1 CAPSULE BY MOUTH DAILY 90 capsule 3  . simvastatin (ZOCOR) 10 MG tablet TAKE 1 TABLET BY MOUTH DAILY 90 tablet 3   No current facility-administered medications on file prior to visit.     Past Medical History:  Diagnosis Date  . Allergic rhinitis   . Cranial nerve VII palsy   . Duodenitis   . Fundic gland polyps of stomach, benign   . GERD (gastroesophageal reflux disease)   . Hemorrhoids   . Hepatitis A   . HLD (hyperlipidemia)     Past Surgical History:  Procedure Laterality Date  . arm surgery     left, ligaments and tendon repair, accident  . MENISCUS REPAIR Right 2018  . NASAL SEPTUM SURGERY      Social History   Socioeconomic History  . Marital status: Married    Spouse name: Not on file  . Number of  children: 3  . Years of education: 1 year of college  . Highest education level: Some college, no degree  Occupational History  . Occupation: Programmer, systems: [employer]  . Financial resource strain: Not on file  . Food insecurity:    Worry: Not on file    Inability: Not on file  . Transportation needs:    Medical: Not on file    Non-medical: Not on file  Tobacco Use  . Smoking status: Former Smoker    Years: 5.00    Types: Cigarettes    Last attempt to quit: 12/24/1982    Years since quitting: 36.2  . Smokeless tobacco: Never Used  Substance and Sexual Activity  . Alcohol use: Yes    Comment: social  . Drug use: No  . Sexual activity: Not on file  Lifestyle  . Physical activity:    Days per week: Not on file    Minutes  per session: Not on file  . Stress: Not on file  Relationships  . Social connections:    Talks on phone: Not on file    Gets together: Not on file    Attends religious service: Not on file    Active member of club or organization: Not on file    Attends meetings of clubs or organizations: Not on file    Relationship status: Not on file  Other Topics Concern  . Not on file  Social History Narrative   Exercise: not currently running   Lives at home with wife    Right handed   Caffeine: 6 cups daily    Family History  Problem Relation Age of Onset  . Prostate cancer Father   . Arthritis Father   . Lymphoma Father   . Colon polyps Mother   . Diabetes Mother   . Parkinson's disease Mother   . Arthritis Mother   . Arthritis Paternal Grandmother   . Heart attack Paternal Grandfather   . Heart disease Paternal Grandfather     Review of Systems  Constitutional: Negative for chills and fever.  HENT: Positive for ear pain and hearing loss.   Eyes: Positive for discharge, redness and itching. Negative for pain and visual disturbance.       Objective:   Vitals:   02/27/19 0926  BP: 128/74  Pulse: 65  Resp: 16  Temp: (!) 97.4 F (36.3 C)  SpO2: 96%   BP Readings from Last 3 Encounters:  02/27/19 128/74  09/17/18 127/84  08/19/18 122/84   Wt Readings from Last 3 Encounters:  02/27/19 188 lb (85.3 kg)  09/17/18 187 lb (84.8 kg)  08/19/18 191 lb 12.8 oz (87 kg)   Body mass index is 26.22 kg/m.   Physical Exam    GENERAL APPEARANCE: Appears stated age, well appearing, NAD EYES: conjunctiva clear, no icterus, no active discharge, extraocular muscles intact, right upper eyelid with mild ptosis, unable to close eye completely HEENT: Left ear canal and tympanic membrane normal.  Right ear canal with dry cerumen-and able to see remainder of the ear canal and tympanic membrane, ear canal mildly swollen, right lower face mildly swollen, right facial droop from Bell's palsy  following surgery, oropharynx with no erythema, no thyromegaly, trachea midline, no cervical or supraclavicular lymphadenopathy LUNGS: Clear to auscultation without wheeze or crackles, unlabored breathing, good air entry bilaterally CARDIOVASCULAR: Normal S1,S2 without murmurs, no edema SKIN: Warm, dry, mild erythema from radiation right neck and around ear  Assessment & Plan:    See Problem List for Assessment and Plan of chronic medical problems.

## 2019-02-27 ENCOUNTER — Encounter: Payer: Self-pay | Admitting: Internal Medicine

## 2019-02-27 ENCOUNTER — Ambulatory Visit (INDEPENDENT_AMBULATORY_CARE_PROVIDER_SITE_OTHER): Payer: BLUE CROSS/BLUE SHIELD | Admitting: Internal Medicine

## 2019-02-27 VITALS — BP 128/74 | HR 65 | Temp 97.4°F | Resp 16 | Ht 71.0 in | Wt 188.0 lb

## 2019-02-27 DIAGNOSIS — H5789 Other specified disorders of eye and adnexa: Secondary | ICD-10-CM

## 2019-02-27 DIAGNOSIS — H938X1 Other specified disorders of right ear: Secondary | ICD-10-CM

## 2019-02-27 HISTORY — DX: Other specified disorders of right ear: H93.8X1

## 2019-02-27 NOTE — Assessment & Plan Note (Signed)
Has some wax, which is cleaned out No obvious relief of symptoms, but he will see with time and after water completely evacuates ear canal if it has helped His symptoms may also be related to his surgery and radiation Has ENT follow-up in 3 weeks

## 2019-02-27 NOTE — Patient Instructions (Signed)
We tried to clean out your ear today.  Your eye does not look infection.  Continue the drops and ointment.

## 2019-02-27 NOTE — Progress Notes (Signed)
Patient consent obtained. Irrigation with water and peroxide performed. Full view of tympanic membranes after procedure.  Patient tolerated procedure well.   Agree with above Binnie Rail, MD

## 2019-02-27 NOTE — Assessment & Plan Note (Signed)
No evidence of infection Irritation and symptoms likely related to dry eye from inability to close eye completely Has follow-up with ENT Continue eyedrops throughout the day and ointment at night

## 2019-03-03 DIAGNOSIS — R5383 Other fatigue: Secondary | ICD-10-CM | POA: Diagnosis not present

## 2019-03-03 DIAGNOSIS — C07 Malignant neoplasm of parotid gland: Secondary | ICD-10-CM | POA: Diagnosis not present

## 2019-03-03 DIAGNOSIS — Z9221 Personal history of antineoplastic chemotherapy: Secondary | ICD-10-CM | POA: Diagnosis not present

## 2019-03-03 DIAGNOSIS — Z923 Personal history of irradiation: Secondary | ICD-10-CM | POA: Diagnosis not present

## 2019-03-03 DIAGNOSIS — Z87891 Personal history of nicotine dependence: Secondary | ICD-10-CM | POA: Diagnosis not present

## 2019-03-17 DIAGNOSIS — H9191 Unspecified hearing loss, right ear: Secondary | ICD-10-CM | POA: Diagnosis not present

## 2019-03-17 DIAGNOSIS — Z85858 Personal history of malignant neoplasm of other endocrine glands: Secondary | ICD-10-CM | POA: Diagnosis not present

## 2019-03-17 DIAGNOSIS — Z9089 Acquired absence of other organs: Secondary | ICD-10-CM | POA: Diagnosis not present

## 2019-03-17 DIAGNOSIS — H938X1 Other specified disorders of right ear: Secondary | ICD-10-CM | POA: Diagnosis not present

## 2019-03-17 DIAGNOSIS — Z87891 Personal history of nicotine dependence: Secondary | ICD-10-CM | POA: Diagnosis not present

## 2019-03-17 DIAGNOSIS — Z9221 Personal history of antineoplastic chemotherapy: Secondary | ICD-10-CM | POA: Diagnosis not present

## 2019-03-17 DIAGNOSIS — Z8509 Personal history of malignant neoplasm of other digestive organs: Secondary | ICD-10-CM | POA: Diagnosis not present

## 2019-03-17 DIAGNOSIS — Z9889 Other specified postprocedural states: Secondary | ICD-10-CM | POA: Diagnosis not present

## 2019-03-17 DIAGNOSIS — H0289 Other specified disorders of eyelid: Secondary | ICD-10-CM | POA: Diagnosis not present

## 2019-04-07 DIAGNOSIS — H02153 Paralytic ectropion of right eye, unspecified eyelid: Secondary | ICD-10-CM | POA: Diagnosis not present

## 2019-04-07 DIAGNOSIS — G510 Bell's palsy: Secondary | ICD-10-CM | POA: Diagnosis not present

## 2019-04-07 DIAGNOSIS — J3489 Other specified disorders of nose and nasal sinuses: Secondary | ICD-10-CM | POA: Diagnosis not present

## 2019-04-24 ENCOUNTER — Encounter: Payer: BLUE CROSS/BLUE SHIELD | Admitting: Internal Medicine

## 2019-04-28 DIAGNOSIS — R5383 Other fatigue: Secondary | ICD-10-CM | POA: Diagnosis not present

## 2019-04-28 DIAGNOSIS — C07 Malignant neoplasm of parotid gland: Secondary | ICD-10-CM | POA: Diagnosis not present

## 2019-04-30 DIAGNOSIS — R5383 Other fatigue: Secondary | ICD-10-CM | POA: Diagnosis not present

## 2019-04-30 DIAGNOSIS — J3489 Other specified disorders of nose and nasal sinuses: Secondary | ICD-10-CM | POA: Diagnosis not present

## 2019-04-30 DIAGNOSIS — H02153 Paralytic ectropion of right eye, unspecified eyelid: Secondary | ICD-10-CM | POA: Diagnosis not present

## 2019-04-30 DIAGNOSIS — C07 Malignant neoplasm of parotid gland: Secondary | ICD-10-CM | POA: Diagnosis not present

## 2019-04-30 DIAGNOSIS — Z87891 Personal history of nicotine dependence: Secondary | ICD-10-CM | POA: Diagnosis not present

## 2019-04-30 DIAGNOSIS — R29810 Facial weakness: Secondary | ICD-10-CM | POA: Diagnosis not present

## 2019-04-30 DIAGNOSIS — G510 Bell's palsy: Secondary | ICD-10-CM | POA: Diagnosis not present

## 2019-05-21 DIAGNOSIS — Z833 Family history of diabetes mellitus: Secondary | ICD-10-CM

## 2019-05-21 HISTORY — DX: Family history of diabetes mellitus: Z83.3

## 2019-05-21 NOTE — Progress Notes (Signed)
Subjective:    Patient ID: Gregory Santiago, male    DOB: Jan 11, 1951, 68 y.o.   MRN: [id number]  HPI He is here for a physical exam.   Right eye surgery on the 10th -plastic surgeon will operate on the lower eyelid has been it is sagging related to nerve palsy and his parotid surgery. The lower lateral eye lid is tender and there is a red spot in one area that looks like a stye.  He just noticed this a couple of days ago.  He was wondering if an antibiotic would help.     He had a recent Ct/PET at [facility] and it shows moderate coronary calcification.  He is not having any concerning symptoms, but wonders about seeing cardiology for further evaluation.  He is taking a cholesterol medication-simvastatin 10 mg daily-in the past he was on a higher dose, but had myalgias and the dose was decreased.  Medications and allergies reviewed with patient and updated if appropriate.  Patient Active Problem List   Diagnosis Date Noted  . Coronary artery calcification seen on CAT scan 05/22/2019  . Family history of diabetes mellitus in mother 05/21/2019  . Irritation of right eye 02/27/2019  . Ear pressure, right 02/27/2019  . 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 Knee pain 04/23/2018  . Bell's palsy 04/23/2018  . GERD (gastroesophageal reflux disease)   . HLD (hyperlipidemia)   . Allergic rhinitis     Current Outpatient Medications on File Prior to Visit  Medication Sig Dispense Refill  . cyclobenzaprine (FLEXERIL) 5 MG tablet Take 1 tablet (5 mg total) by mouth 3 (three) times daily as needed for muscle spasms. 30 tablet 3  . fluticasone (FLONASE) 50 MCG/ACT nasal spray Place 2 sprays into both nostrils daily.    . hydrocortisone (ANUSOL-HC) 2.5 % rectal cream Place 1 application rectally 2 (two) times daily as needed for hemorrhoids or itching. Keep on file - does not need filled now  30 g 5  . omeprazole (PRILOSEC) 40 MG capsule TAKE 1 CAPSULE BY MOUTH DAILY 90 capsule 3  . simvastatin (ZOCOR) 10 MG tablet TAKE 1 TABLET BY MOUTH DAILY 90 tablet 3   No current facility-administered medications on file prior to visit.     Past Medical History:  Diagnosis Date  . Allergic rhinitis   . Cranial nerve VII palsy   . Duodenitis   . Fundic gland polyps of stomach, benign   . GERD (gastroesophageal reflux disease)   . Hemorrhoids   . Hepatitis A   . HLD (hyperlipidemia)     Past Surgical History:  Procedure Laterality Date  . arm surgery     left, ligaments and tendon repair, accident  . MENISCUS REPAIR Right 2018  . NASAL SEPTUM SURGERY      Social History   Socioeconomic History  . Marital status: Married    Spouse name: Not on file  . Number of children: 3  . Years of education: 1 year of college  . Highest education level: Some college, no degree  Occupational History  . 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 Financial resource strain: Not on file  . Food insecurity:    Worry: Not on file    Inability: Not on file  . Transportation needs:    Medical: Not on file    Non-medical: Not  on file  Tobacco Use  . Smoking status: Former Smoker    Years: 5.00    Types: Cigarettes    Last attempt to quit: 12/24/1982    Years since quitting: 36.4  . Smokeless tobacco: Never Used  Substance and Sexual Activity  . Alcohol use: Yes    Comment: social  . Drug use: No  . Sexual activity: Not on file  Lifestyle  . Physical activity:    Days per week: Not on file    Minutes per session: Not on file  . Stress: Not on file  Relationships  . Social connections:    Talks on phone: Not on file    Gets together: Not on file    Attends religious service: Not on file    Active member of club or organization: Not on file    Attends meetings of clubs or organizations: Not on file    Relationship status: Not on file  Other Topics  Concern  . Not on file  Social History Narrative   Exercise: not currently running   Lives at home with wife    Right handed   Caffeine: 6 cups daily    Family History  Problem Relation Age of Onset  . Prostate cancer Father   . Arthritis Father   . Lymphoma Father   . Colon polyps Mother   . Diabetes Mother   . Parkinson's disease Mother   . Arthritis Mother   . Arthritis Paternal Grandmother   . Heart attack Paternal Grandfather   . Heart disease Paternal Grandfather     Review of Systems  Constitutional: Negative for chills and fever.  Eyes: Positive for discharge and redness. Negative for visual disturbance.  Respiratory: Negative for cough, shortness of breath and wheezing.   Cardiovascular: Negative for chest pain, palpitations and leg swelling.  Gastrointestinal: Negative for abdominal pain, blood in stool, constipation, diarrhea and nausea.       Gerd controlled  Genitourinary: Negative for difficulty urinating, frequency and hematuria.  Musculoskeletal: Positive for arthralgias (hands, knees).  Skin: Negative for color change and rash.  Neurological: Positive for headaches (occasional). Negative for dizziness and light-headedness.  Psychiatric/Behavioral: Negative for dysphoric mood. The patient is not nervous/anxious.        Objective:   Vitals:   05/22/19 0744  BP: 130/84  Pulse: (!) 57  Resp: 16  Temp: 97.7 F (36.5 C)  SpO2: 96%   Filed Weights   05/22/19 0744  Weight: 180 lb 12.8 oz (82 kg)   Body mass index is 25.22 kg/m.  Wt Readings from Last 3 Encounters:  05/22/19 180 lb 12.8 oz (82 kg)  02/27/19 188 lb (85.3 kg)  09/17/18 187 lb (84.8 kg)     Physical Exam Constitutional: He appears well-developed and well-nourished. No distress.  HENT:  Head: Normocephalic and atraumatic.  Bell's palsy on the right. Right Ear: External ear normal.  Left Ear: External ear normal.  Mouth/Throat: Oropharynx is clear and moist.  Normal ear canals  and TM b/l  Eyes: Conjunctivae and EOM are normal.  Bell's palsy on right.  Right lower eyelid with increased erythema and stye on lateral aspect of lower eyelid Neck: Neck supple. No tracheal deviation present. No thyromegaly present.  No carotid bruit  Cardiovascular: Normal rate, regular rhythm, normal heart sounds and intact distal pulses.  No murmur heard. Pulmonary/Chest: Effort normal and breath sounds normal. No respiratory distress. He has no wheezes. He has no rales.  Abdominal: Soft. He  exhibits no distension. There is no tenderness.  Genitourinary: deferred  Musculoskeletal: He exhibits no edema.  Lymphadenopathy:   He has no cervical adenopathy.  Skin: Skin is warm and dry. He is not diaphoretic.  Psychiatric: He has a normal mood and affect. His behavior is normal.         Assessment & Plan:   Health Maintenance  Topic Date Due  . COLONOSCOPY  11/26/2018  . INFLUENZA VACCINE  07/25/2019  . PNA vac Low Risk Adult (2 of 2 - PPSV23) 03/10/2020  . TETANUS/TDAP  11/29/2023  . Hepatitis C Screening  Completed     Physical exam: Screening blood work   ordered Immunizations   all vaccines up-to-date Colonoscopy  Due 11/2018 - wants to hold off until COVID-19 situation has resolved - had recent PET scan  Eye exams up-to-date Exercise   walking Weight   Normal BMI Skin   No concerns Substance abuse    none  See Problem List for Assessment and Plan of chronic medical problems.   Follow-up annually

## 2019-05-21 NOTE — Patient Instructions (Addendum)
Tests ordered today. Your results will be released to [place] (or called to you) after review, usually within 72hours after test completion. If any changes need to be made, you will be notified at that same time.  All other Health Maintenance issues reviewed.   All recommended immunizations and age-appropriate screenings are up-to-date or discussed.  No immunizations administered today.   Medications reviewed and updated.  Changes include :  none   Your prescription(s) have been submitted to your pharmacy. Please take as directed and contact our office if you believe you are having problem(s) with the medication(s).  A referral was ordered for cardiology  Please followup in 1 year    Health Maintenance, Male A healthy lifestyle and preventive care is important for your health and wellness. Ask your health care provider about what schedule of regular examinations is right for you. What should I know about weight and diet? Eat a Healthy Diet  Eat plenty of vegetables, fruits, whole grains, low-fat dairy products, and lean protein.  Do not eat a lot of foods high in solid fats, added sugars, or salt.  Maintain a Healthy Weight Regular exercise can help you achieve or maintain a healthy weight. You should:  Do at least 150 minutes of exercise each week. The exercise should increase your heart rate and make you sweat (moderate-intensity exercise).  Do strength-training exercises at least twice a week. Watch Your Levels of Cholesterol and Blood Lipids  Have your blood tested for lipids and cholesterol every 5 years starting at 67 years of age. If you are at high risk for heart disease, you should start having your blood tested when you are 67 years old. You may need to have your cholesterol levels checked more often if: ? Your lipid or cholesterol levels are high. ? You are older than 67 years of age. ? You are at high risk for heart disease. What should I know about cancer  screening? Many types of cancers can be detected early and may often be prevented. Lung Cancer  You should be screened every year for lung cancer if: ? You are a current smoker who has smoked for at least 30 years. ? You are a former smoker who has quit within the past 15 years.  Talk to your health care provider about your screening options, when you should start screening, and how often you should be screened. Colorectal Cancer  Routine colorectal cancer screening usually begins at 67 years of age and should be repeated every 5-10 years until you are 67 years old. You may need to be screened more often if early forms of precancerous polyps or small growths are found. Your health care provider may recommend screening at an earlier age if you have risk factors for colon cancer.  Your health care provider may recommend using home test kits to check for hidden blood in the stool.  A small camera at the end of a tube can be used to examine your colon (sigmoidoscopy or colonoscopy). This checks for the earliest forms of colorectal cancer. Prostate and Testicular Cancer  Depending on your age and overall health, your health care provider may do certain tests to screen for prostate and testicular cancer.  Talk to your health care provider about any symptoms or concerns you have about testicular or prostate cancer. Skin Cancer  Check your skin from head to toe regularly.  Tell your health care provider about any new moles or changes in moles, especially if: ? There is  a change in a mole's size, shape, or color. ? You have a mole that is larger than a pencil eraser.  Always use sunscreen. Apply sunscreen liberally and repeat throughout the day.  Protect yourself by wearing long sleeves, pants, a wide-brimmed hat, and sunglasses when outside. What should I know about heart disease, diabetes, and high blood pressure?  If you are 86-110 years of age, have your blood pressure checked every 3-5  years. If you are 7 years of age or older, have your blood pressure checked every year. You should have your blood pressure measured twice-once when you are at a hospital or clinic, and once when you are not at a hospital or clinic. Record the average of the two measurements. To check your blood pressure when you are not at a hospital or clinic, you can use: ? An automated blood pressure machine at a pharmacy. ? A home blood pressure monitor.  Talk to your health care provider about your target blood pressure.  If you are between 47-83 years old, ask your health care provider if you should take aspirin to prevent heart disease.  Have regular diabetes screenings by checking your fasting blood sugar level. ? If you are at a normal weight and have a low risk for diabetes, have this test once every three years after the age of 76. ? If you are overweight and have a high risk for diabetes, consider being tested at a younger age or more often.  A one-time screening for abdominal aortic aneurysm (AAA) by ultrasound is recommended for men aged 35-75 years who are current or former smokers. What should I know about preventing infection? Hepatitis B If you have a higher risk for hepatitis B, you should be screened for this virus. Talk with your health care provider to find out if you are at risk for hepatitis B infection. Hepatitis C Blood testing is recommended for:  Everyone born from [birth year] through 1964.  Anyone with known risk factors for hepatitis C. Sexually Transmitted Diseases (STDs)  You should be screened each year for STDs including gonorrhea and chlamydia if: ? You are sexually active and are younger than 67 years of age. ? You are older than 67 years of age and your health care provider tells you that you are at risk for this type of infection. ? Your sexual activity has changed since you were last screened and you are at an increased risk for chlamydia or gonorrhea. Ask your health care  provider if you are at risk.  Talk with your health care provider about whether you are at high risk of being infected with HIV. Your health care provider may recommend a prescription medicine to help prevent HIV infection. What else can I do?  Schedule regular health, dental, and eye exams.  Stay current with your vaccines (immunizations).  Do not use any tobacco products, such as cigarettes, chewing tobacco, and e-cigarettes. If you need help quitting, ask your health care provider.  Limit alcohol intake to no more than 2 drinks per day. One drink equals 12 ounces of beer, 5 ounces of wine, or 1 ounces of hard liquor.  Do not use street drugs.  Do not share needles.  Ask your health care provider for help if you need support or information about quitting drugs.  Tell your health care provider if you often feel depressed.  Tell your health care provider if you have ever been abused or do not feel safe at home. This information  is not intended to replace advice given to you by your health care provider. Make sure you discuss any questions you have with your health care provider. Document Released: 06/07/2008 Document Revised: 08/08/2016 Document Reviewed: 09/13/2015 Elsevier Interactive Patient Education  2019 [employer].

## 2019-05-22 ENCOUNTER — Other Ambulatory Visit: Payer: Self-pay

## 2019-05-22 ENCOUNTER — Other Ambulatory Visit (INDEPENDENT_AMBULATORY_CARE_PROVIDER_SITE_OTHER): Payer: BC Managed Care – PPO

## 2019-05-22 ENCOUNTER — Other Ambulatory Visit: Payer: BLUE CROSS/BLUE SHIELD

## 2019-05-22 ENCOUNTER — Ambulatory Visit (INDEPENDENT_AMBULATORY_CARE_PROVIDER_SITE_OTHER): Payer: BLUE CROSS/BLUE SHIELD | Admitting: Internal Medicine

## 2019-05-22 ENCOUNTER — Encounter: Payer: Self-pay | Admitting: Internal Medicine

## 2019-05-22 VITALS — BP 130/84 | HR 57 | Temp 97.7°F | Resp 16 | Ht 71.0 in | Wt 180.8 lb

## 2019-05-22 DIAGNOSIS — Z833 Family history of diabetes mellitus: Secondary | ICD-10-CM

## 2019-05-22 DIAGNOSIS — Z0000 Encounter for general adult medical examination without abnormal findings: Secondary | ICD-10-CM

## 2019-05-22 DIAGNOSIS — I2510 Atherosclerotic heart disease of native coronary artery without angina pectoris: Secondary | ICD-10-CM | POA: Diagnosis not present

## 2019-05-22 DIAGNOSIS — H00019 Hordeolum externum unspecified eye, unspecified eyelid: Secondary | ICD-10-CM | POA: Insufficient documentation

## 2019-05-22 DIAGNOSIS — E782 Mixed hyperlipidemia: Secondary | ICD-10-CM

## 2019-05-22 DIAGNOSIS — C089 Malignant neoplasm of major salivary gland, unspecified: Secondary | ICD-10-CM | POA: Diagnosis not present

## 2019-05-22 DIAGNOSIS — K219 Gastro-esophageal reflux disease without esophagitis: Secondary | ICD-10-CM

## 2019-05-22 DIAGNOSIS — H00012 Hordeolum externum right lower eyelid: Secondary | ICD-10-CM

## 2019-05-22 HISTORY — DX: Atherosclerotic heart disease of native coronary artery without angina pectoris: I25.10

## 2019-05-22 HISTORY — DX: Hordeolum externum unspecified eye, unspecified eyelid: H00.019

## 2019-05-22 LAB — COMPREHENSIVE METABOLIC PANEL
ALT: 14 U/L (ref 0–53)
AST: 15 U/L (ref 0–37)
Albumin: 4.5 g/dL (ref 3.5–5.2)
Alkaline Phosphatase: 63 U/L (ref 39–117)
BUN: 14 mg/dL (ref 6–23)
CO2: 29 mEq/L (ref 19–32)
Calcium: 9.8 mg/dL (ref 8.4–10.5)
Chloride: 104 mEq/L (ref 96–112)
Creatinine, Ser: 0.92 mg/dL (ref 0.40–1.50)
GFR: 82.15 mL/min (ref >60.00)
Glucose, Bld: 94 mg/dL (ref 70–99)
Potassium: 4.3 mEq/L (ref 3.5–5.1)
Sodium: 140 mEq/L (ref 135–145)
Total Bilirubin: 0.5 mg/dL (ref 0.2–1.2)
Total Protein: 7.3 g/dL (ref 6.0–8.3)

## 2019-05-22 LAB — CBC WITH DIFFERENTIAL/PLATELET
Basophils Absolute: 0 10*3/uL (ref 0.0–0.1)
Basophils Relative: 1 % (ref 0.0–3.0)
Eosinophils Absolute: 0.1 10*3/uL (ref 0.0–0.7)
Eosinophils Relative: 2.8 % (ref 0.0–5.0)
HCT: 42.8 % (ref 39.0–52.0)
Hemoglobin: 14.9 g/dL (ref 13.0–17.0)
Lymphocytes Relative: 28.8 % (ref 12.0–46.0)
Lymphs Abs: 0.9 10*3/uL (ref 0.7–4.0)
MCHC: 34.8 g/dL (ref 30.0–36.0)
MCV: 89 fl (ref 78.0–100.0)
Monocytes Absolute: 0.4 10*3/uL (ref 0.1–1.0)
Monocytes Relative: 13.8 % — ABNORMAL HIGH (ref 3.0–12.0)
Neutro Abs: 1.7 10*3/uL (ref 1.4–7.7)
Neutrophils Relative %: 53.6 % (ref 43.0–77.0)
Platelets: 175 10*3/uL (ref 150.0–400.0)
RBC: 4.8 Mil/uL (ref 4.22–5.81)
RDW: 12.1 % (ref 11.5–15.5)
WBC: 3.2 10*3/uL — ABNORMAL LOW (ref 4.0–10.5)

## 2019-05-22 LAB — LIPID PANEL
Cholesterol: 219 mg/dL — ABNORMAL HIGH (ref 0–200)
HDL: 40 mg/dL (ref >39.00)
LDL Cholesterol: 153 mg/dL — ABNORMAL HIGH (ref 0–99)
NonHDL: 179.34
Total CHOL/HDL Ratio: 5
Triglycerides: 130 mg/dL (ref 0.0–149.0)
VLDL: 26 mg/dL (ref 0.0–40.0)

## 2019-05-22 LAB — TSH: TSH: 2.08 u[IU]/mL (ref 0.35–4.50)

## 2019-05-22 LAB — HEMOGLOBIN A1C: Hgb A1c MFr Bld: 5.6 % (ref 4.6–6.5)

## 2019-05-22 MED ORDER — OMEPRAZOLE 40 MG PO CPDR: DELAYED_RELEASE_CAPSULE | ORAL | 3 refills | Status: DC

## 2019-05-22 MED ORDER — SIMVASTATIN 20 MG PO TABS: 20.0000 mg | ORAL_TABLET | Freq: Every day | ORAL | 3 refills | Status: DC

## 2019-05-22 MED ORDER — ERYTHROMYCIN 5 MG/GM OP OINT: 1.0000 "application " | TOPICAL_OINTMENT | Freq: Every day | OPHTHALMIC | 0 refills | Status: DC

## 2019-05-22 NOTE — Assessment & Plan Note (Signed)
S/p surgery doing well Had recent PET/CT and follows up routinely at [facility] No evidence of recurrence

## 2019-05-22 NOTE — Assessment & Plan Note (Signed)
Right lower eyelid We will try erythromycin ointment topically-if no improvement will follow-up with ophthalmology

## 2019-05-22 NOTE — Assessment & Plan Note (Signed)
LDL not ideally controlled, especially given his coronary artery calcification Taking simvastatin 10 mg daily-in the past he was on a higher dose but had myalgias Discussed increasing the dose and trying it again or switching to something stronger such as Crestor We will first try increasing the simvastatin to 20 mg daily-if he does not tolerate this can consider a different statin Will need repeat lipid panel in a few weeks once we find the dose he can tolerate

## 2019-05-22 NOTE — Assessment & Plan Note (Addendum)
04/28/2019-CT/PET scan done at [facility] for follow-up of his salivary duct carcinoma of the right parotid showed moderate-heavy coronary artery calcification and scattered atherosclerosis of aortoiliac vessels Asymptomatic-exercises and has no concerning symptoms currently on simvastatin 10 mg daily-was on a higher dose in the past, but had myalgias Will increase to 20 mg and if he tolerates this we will increase further-discussed having LDL lower than it currently is-ideally less than 70.  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

## 2019-05-22 NOTE — Assessment & Plan Note (Signed)
GERD controlled Continue daily medication  

## 2019-05-22 NOTE — Assessment & Plan Note (Signed)
We will check A1c ?

## 2019-05-25 LAB — PSA, TOTAL AND FREE
PSA, % Free: UNDETERMINED % (calc) (ref >25)
PSA, Free: <0.1 ng/mL
PSA, Total: <0.1 ng/mL (ref <=4.0)

## 2019-05-26 ENCOUNTER — Encounter: Payer: Self-pay | Admitting: Internal Medicine

## 2019-05-28 ENCOUNTER — Telehealth: Payer: BC Managed Care – PPO | Admitting: Cardiology

## 2019-06-01 NOTE — Progress Notes (Signed)
Virtual Visit via Video Note   This visit type was conducted due to national recommendations for restrictions regarding the COVID-19 Pandemic (e.g. social distancing) in an effort to limit this patient's exposure and mitigate transmission in our community.  Due to his co-morbid illnesses, this patient is at least at moderate risk for complications without adequate follow up.  This format is felt to be most appropriate for this patient at this time.  All issues noted in this document were discussed and addressed.  A limited physical exam was performed with this format.  Please refer to the patient's chart for his consent to telehealth for [facility].   Date:  06/03/2019   ID:  Gregory Santiago, DOB 1951/08/16, MRN [id number]  Patient Location: Home Provider Location: Office  PCP:  Binnie Rail, MD  Cardiologist:  Shirlee More, MD Electrophysiologist:  None   Evaluation Performed:  Consultation - Gregory Santiago was referred by Dr. Billey Gosling for the evaluation of CAC.  Chief Complaint:    History of Present Illness:    Gregory Santiago is a 68 y.o. male who is being seen today for the evaluation of CAC at the request of Burns, Claudina Lick, MD. He had a recent PET/CT : Heavy coronary artery calcification. Mild scattered atherosclerotic calcification involving the aortoiliac vessels. Remote cardiac testing of stress echo 10-11 years ago. PMH GERD, HLD, Bell's palsy, and salivary gland cancer. Recently underwent salivary gland cancer treated with chemo, radiation, surgery. Toxil, Carvo as chemotherapy agents as adjunct to radiation.   No chest pain, SOB, edema. Denies cardiac symptoms. His blood pressure is well controlled at home with no antihypertensives. He is on Simvastatin for HLD which was recently increased beginning of June, tolerating well. Recommend recheck labs in 4 weeks before consideration of increased dose of statin.   Cardiovascular risk factors:   2 year smoking history quit in 1980s.    Paternal grandfather and paternal uncle with fatal MI.   His wife who is an Therapist, sports was present for the video visit. After discussion and shared decision making plan was created to proceed with Lexiscan and echocardiogram due to familial risk factors, "heavy" CAC, and history of cancer.   The patient does not have symptoms concerning for COVID-19 infection (fever, chills, cough, or new shortness of breath).    Past Medical History:  Diagnosis Date  . Allergic rhinitis   . AR (allergic rhinitis) 10/28/2018  . Coronary artery calcification seen on CAT scan 05/22/2019  . Cranial nerve VII palsy   . Duodenitis   . Ear pressure, right 02/27/2019  . Family history of diabetes mellitus in mother 05/21/2019  . Fundic gland polyps of stomach, benign   . GERD (gastroesophageal reflux disease)   . Hemorrhoids   . Hepatitis A   . 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 HLD (hyperlipidemia)   . Knee pain 04/23/2018  . Oropharyngeal dysphagia 06/02/2019  . 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  . 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/02/2019  . Stye 05/22/2019   Past Surgical History:  Procedure Laterality Date  . arm  surgery     left, ligaments and tendon repair, accident  . EYE SURGERY  11/2018   eye lid repair  . MENISCUS REPAIR Right 2018  . NASAL SEPTUM SURGERY    . SALIVARY GLAND SURGERY Right    Nov 2019     Current Meds  Medication Sig  . cyclobenzaprine (FLEXERIL) 5 MG tablet Take 1 tablet (5 mg total) by mouth 3 (three) times daily as  needed for muscle spasms.  Marland Kitchen erythromycin ophthalmic ointment Place 1 application into the right eye at bedtime.  . fluticasone (FLONASE) 50 MCG/ACT nasal spray Place 2 sprays into both nostrils daily.  . hydrocortisone (ANUSOL-HC) 2.5 % rectal cream Place 1 application rectally 2 (two) times daily as needed for hemorrhoids or itching. Keep on file - does not need filled now  . Multiple Vitamin (MULTIVITAMIN) tablet Take 1 tablet by mouth daily.  Marland Kitchen omeprazole (PRILOSEC) 40 MG capsule TAKE 1 CAPSULE BY MOUTH DAILY  . simvastatin (ZOCOR) 20 MG tablet Take 1 tablet (20 mg total) by mouth at bedtime.     Allergies:   Patient has no known allergies.   Social History   Tobacco Use  . Smoking status: Former Smoker    Packs/day: 0.50    Years: 5.00    Pack years: 2.50    Types: Cigarettes    Last attempt to quit: 12/24/1982    Years since quitting: 36.4  . Smokeless tobacco: Never Used  Substance Use Topics  . Alcohol use: Yes    Comment: social  . Drug use: No     Family Hx: The patient's family history includes Arthritis in his father, mother, and paternal grandmother; Colon polyps in his mother; Diabetes in his mother; Heart attack in his paternal grandfather; Heart disease in his paternal grandfather; Lymphoma in his father; Parkinson's disease in his mother; Prostate cancer in his father.  ROS:   Please see the history of present illness.    Review of Systems  Constitution: Negative for chills, fever and malaise/fatigue.  Cardiovascular: Negative for chest pain, dyspnea on exertion, irregular heartbeat, leg swelling, near-syncope, orthopnea, palpitations and paroxysmal nocturnal dyspnea.  Respiratory: Negative for cough, shortness of breath and wheezing.     All other systems reviewed and are negative.   Prior CV studies:   The following studies were reviewed today:  PET 04/28/19 at [facility]: Heavy coronary calcification Full report available in Care Everywhere   Labs/Other Tests and Data Reviewed:    EKG:  An ECG dated 05/22/19 was personally reviewed today and demonstrated:  Sinus bradycardia rate 50 with nonspecific ST/T wave changes  Recent Labs: 05/22/2019: ALT 14; BUN 14; Creatinine, Ser 0.92; Hemoglobin 14.9; Platelets 175.0; Potassium 4.3; Sodium 140; TSH 2.08   Recent Lipid Panel Lab Results  Component Value Date/Time   CHOL 219 (H) 05/22/2019 08:30 AM   TRIG 130.0 05/22/2019 08:30 AM   HDL 40.00 05/22/2019 08:30 AM   CHOLHDL 5 05/22/2019 08:30 AM   LDLCALC 153 (H) 05/22/2019 08:30 AM   LDLDIRECT 155.9 01/29/2014 10:28 AM    Wt Readings from Last 3 Encounters:  06/03/19 175 lb (79.4 kg)  05/22/19 180 lb 12.8 oz (82 kg)  02/27/19 188 lb (85.3 kg)     Objective:    Vital Signs:  BP 120/82 (BP Location: Left Arm, Patient Position: Sitting)   Pulse 72   Ht 5\' 11"  (1.803 m)   Wt 175 lb (79.4 kg)   BMI 24.41 kg/m  VITAL SIGNS:  reviewed GEN:  no acute distress EYES:  sclerae anicteric, EOMI - Extraocular Movements Intact RESPIRATORY:  normal respiratory effort, symmetric expansion NEURO:  alert and oriented x 3, no obvious focal deficit PSYCH:  normal affect  ASSESSMENT & PLAN:    1. CAC - "Heavy coronary artery calcification" on PET/CT 04/2019 as part of follow up of salivary duct carcinoma. He denies chest pain, SOB, DOE, palpitations. Given familial history (paternal grandfather and uncle with fatal MI), cancer, and "heavy" calcification will proceed with lexiscan and echocardiogram. After discussion and educated decision making he and his wife are both agreeable to this plan.  2. HLD - Simvastatin increased beginning of June after lipid panel 05/22/19 with total cholesterol 219, HDL 40, LDL 153, Triglycerides 130. Reports no myalgias. Recently adjusted back to a more low fat diet after completing surgery, radiation, chemotherapy. Recommend repeat lipid panel beginning of July to assess need for further increase or trial of  Rosuvastatin as it is well tolerated. 3. 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 Congratulated on recent PET scan results.  4. Encounter for cardiac risk counseling - Nonsmoker. Blood pressure well controlled on no anti-hypertensives. HLD medications being adjusted and closely monitored. Recommend low salt, heart healthy diet. Recommend gradual increase to 30-40 minutes of exercise 3-4 times per week per American Heart Association guidelines.   COVID-19 Education: The signs and symptoms of COVID-19 were discussed with the patient and how to seek care for testing (follow up with PCP or arrange E-visit).  The importance of social distancing was discussed today.  Time:   Today, I have spent 25 minutes with the patient with telehealth technology discussing the above problems.  An additional 20 minutes was spent reviewing notes from PCP office as well as PET/CT scan.   Medication Adjustments/Labs and Tests Ordered: Current medicines are reviewed at length with the patient today.  Concerns regarding medicines are outlined above.   Tests Ordered: Orders Placed This Encounter  Procedures  . MYOCARDIAL PERFUSION IMAGING  . ECHOCARDIOGRAM COMPLETE    Medication Changes: No orders of the defined types were placed in this encounter.   Disposition:  Follow up in 4 week(s)  Signed, Shirlee More, MD  06/03/2019 4:34 PM    [facility]

## 2019-06-02 DIAGNOSIS — H903 Sensorineural hearing loss, bilateral: Secondary | ICD-10-CM

## 2019-06-02 DIAGNOSIS — R1312 Dysphagia, oropharyngeal phase: Secondary | ICD-10-CM | POA: Insufficient documentation

## 2019-06-02 DIAGNOSIS — Z8509 Personal history of malignant neoplasm of other digestive organs: Secondary | ICD-10-CM | POA: Diagnosis not present

## 2019-06-02 HISTORY — DX: Sensorineural hearing loss, bilateral: H90.3

## 2019-06-02 HISTORY — DX: Dysphagia, oropharyngeal phase: R13.12

## 2019-06-03 ENCOUNTER — Encounter: Payer: Self-pay | Admitting: Cardiology

## 2019-06-03 ENCOUNTER — Telehealth (INDEPENDENT_AMBULATORY_CARE_PROVIDER_SITE_OTHER): Payer: BC Managed Care – PPO | Admitting: Cardiology

## 2019-06-03 ENCOUNTER — Encounter: Payer: Self-pay | Admitting: *Deleted

## 2019-06-03 ENCOUNTER — Other Ambulatory Visit: Payer: Self-pay

## 2019-06-03 VITALS — BP 120/82 | HR 72 | Ht 71.0 in | Wt 175.0 lb

## 2019-06-03 DIAGNOSIS — R079 Chest pain, unspecified: Secondary | ICD-10-CM

## 2019-06-03 DIAGNOSIS — I2510 Atherosclerotic heart disease of native coronary artery without angina pectoris: Secondary | ICD-10-CM | POA: Diagnosis not present

## 2019-06-03 DIAGNOSIS — Z7189 Other specified counseling: Secondary | ICD-10-CM | POA: Insufficient documentation

## 2019-06-03 DIAGNOSIS — E782 Mixed hyperlipidemia: Secondary | ICD-10-CM

## 2019-06-03 DIAGNOSIS — C089 Malignant neoplasm of major salivary gland, unspecified: Secondary | ICD-10-CM

## 2019-06-03 NOTE — Patient Instructions (Addendum)
Medication Instructions:  Your physician recommends that you continue on your current medications as directed. Please refer to the Current Medication list given to you today.  If you need a refill on your cardiac medications before your next appointment, please call your pharmacy.   Lab work: None If you have labs (blood work) drawn today and your tests are completely normal, you will receive your results only by: Marland Kitchen MyChart Message (if you have MyChart) OR . A paper copy in the mail If you have any lab test that is abnormal or we need to change your treatment, we will call you to review the results.  Testing/Procedures: Your physician has requested that you have a lexiscan myoview. For further information please visit [email or web address]. Please follow instruction sheet, as given.   [facility] [address] [place], [place] [address] Phone: [phone]  June 03, 2019    Gregory Santiago DOB: [birth year] [name] [name]   Dear [name], Santiago are scheduled for a Myocardial Perfusion Imaging Study on: June 17,2020  At 7:30AM  .  Please arrive 15 minutes prior to your appointment time for registration and insurance purposes.  The test will take approximately 3 to 4 hours to complete; you may bring reading material.  If someone comes with you to your appointment, they will need to remain in the main lobby due to limited space in the testing area. **If you are pregnant or breastfeeding, please notify the nuclear lab prior to your appointment**  How to prepare for your Myocardial Perfusion Test: . Do not eat or drink 3 hours prior to your test, except you may have water. . Do not consume products containing caffeine (regular or decaffeinated) 12 hours prior to your test. (ex: coffee, chocolate, sodas, tea). . Do bring a list of your current medications with you.  If not listed below, you may take  your medications as normal. . Do wear comfortable clothes (no dresses or overalls) and walking shoes, tennis shoes preferred (No heels or open toe shoes are allowed). . Do NOT wear cologne, perfume, aftershave, or lotions (deodorant is allowed). . If these instructions are not followed, your test will have to be rescheduled.  Please report to [address] for your test.  If you have questions or concerns about your appointment, you can call the Nuclear Lab at [phone].  If you cannot keep your appointment, please provide 24 hours notification to the Nuclear Lab, to avoid a possible $50 charge to your account.    Your physician has requested that you have an echocardiogram. Echocardiography is a painless test that uses sound waves to create images of your heart. It provides your doctor with information about the size and shape of your heart and how well your heart's chambers and valves are working. This procedure takes approximately one hour. There are no restrictions for this procedure.  YOUR APPOINTMENT FOR THE ECHO IS  June 19,2020 AT 11:15AM   Follow-Up: At [facility], you and your health needs are our priority.  As part of our continuing mission to provide you with exceptional heart care, we have created designated Provider Care Teams.  These Care Teams include your primary Cardiologist (physician) and Advanced Practice Providers (APPs -  Physician Assistants and Nurse Practitioners) who all work together to provide you with the care you need, when you need it. You will need a follow up appointment  in 1 months after Lexiscan Any Other Special Instructions Will Be Listed Below (If Applicable).

## 2019-06-05 ENCOUNTER — Telehealth (HOSPITAL_COMMUNITY): Payer: Self-pay | Admitting: *Deleted

## 2019-06-05 NOTE — Telephone Encounter (Signed)
Patient given detailed instructions per Myocardial Perfusion Study Information Sheet for the test on 06/10/19 at 7:30. Patient notified to arrive 15 minutes early and that it is imperative to arrive on time for appointment to keep from having the test rescheduled.  If you need to cancel or reschedule your appointment, please call the office within 24 hours of your appointment. . Patient verbalized understanding.Gregory Santiago

## 2019-06-10 ENCOUNTER — Ambulatory Visit (HOSPITAL_COMMUNITY): Payer: BC Managed Care – PPO | Attending: Internal Medicine

## 2019-06-10 ENCOUNTER — Other Ambulatory Visit: Payer: Self-pay

## 2019-06-10 ENCOUNTER — Telehealth: Payer: Self-pay

## 2019-06-10 VITALS — Ht 71.0 in | Wt 175.0 lb

## 2019-06-10 DIAGNOSIS — E7849 Other hyperlipidemia: Secondary | ICD-10-CM | POA: Diagnosis not present

## 2019-06-10 DIAGNOSIS — I2510 Atherosclerotic heart disease of native coronary artery without angina pectoris: Secondary | ICD-10-CM | POA: Diagnosis not present

## 2019-06-10 DIAGNOSIS — R079 Chest pain, unspecified: Secondary | ICD-10-CM | POA: Diagnosis not present

## 2019-06-10 LAB — MYOCARDIAL PERFUSION IMAGING
LV dias vol: 114 mL (ref 62–150)
LV sys vol: 52 mL
Peak HR: 96 {beats}/min
Rest HR: 48 {beats}/min
SDS: 0
SRS: 0
SSS: 0
TID: 1

## 2019-06-10 MED ORDER — REGADENOSON 0.4 MG/5ML IV SOLN
0.4000 mg | Freq: Once | INTRAVENOUS | Status: AC
  Administered 2019-06-10: 0.4 mg via INTRAVENOUS

## 2019-06-10 MED ORDER — TECHNETIUM TC 99M TETROFOSMIN IV KIT
10.1000 | PACK | Freq: Once | INTRAVENOUS | Status: AC | PRN
  Administered 2019-06-10: 10.1 via INTRAVENOUS
  Filled 2019-06-10: qty 11

## 2019-06-10 MED ORDER — TECHNETIUM TC 99M TETROFOSMIN IV KIT
32.3000 | PACK | Freq: Once | INTRAVENOUS | Status: AC | PRN
  Administered 2019-06-10: 32.3 via INTRAVENOUS
  Filled 2019-06-10: qty 33

## 2019-06-10 NOTE — Telephone Encounter (Signed)
-----   Message from Jenean Lindau, MD sent at 06/10/2019  2:58 PM EDT ----- The results of the study is unremarkable. Please inform patient. I will discuss in detail at next appointment. Cc  primary care/referring physician Jenean Lindau, MD 06/10/2019 2:58 PM

## 2019-06-10 NOTE — Telephone Encounter (Signed)
Results relayed to wife, confirmed upcoming echo date. No further questions at this time. Copy of information sent to Dr. Quay Burow per Dr. Docia Furl request.

## 2019-06-12 ENCOUNTER — Other Ambulatory Visit: Payer: Self-pay

## 2019-06-12 ENCOUNTER — Ambulatory Visit (HOSPITAL_BASED_OUTPATIENT_CLINIC_OR_DEPARTMENT_OTHER)
Admission: RE | Admit: 2019-06-12 | Discharge: 2019-06-12 | Disposition: A | Discharge status: Home / Self Care | Payer: BC Managed Care – PPO | Source: Ambulatory Visit | Attending: Cardiology | Admitting: Cardiology

## 2019-06-12 DIAGNOSIS — I2510 Atherosclerotic heart disease of native coronary artery without angina pectoris: Secondary | ICD-10-CM

## 2019-06-12 DIAGNOSIS — R079 Chest pain, unspecified: Secondary | ICD-10-CM

## 2019-06-12 NOTE — Progress Notes (Signed)
  Echocardiogram 2D Echocardiogram has been performed.  Gregory Santiago 06/12/2019, 11:59 AM

## 2019-06-25 DIAGNOSIS — C07 Malignant neoplasm of parotid gland: Secondary | ICD-10-CM | POA: Diagnosis not present

## 2019-06-26 DIAGNOSIS — Z1159 Encounter for screening for other viral diseases: Secondary | ICD-10-CM | POA: Diagnosis not present

## 2019-06-26 DIAGNOSIS — H02102 Unspecified ectropion of right lower eyelid: Secondary | ICD-10-CM | POA: Diagnosis not present

## 2019-06-26 DIAGNOSIS — Z01812 Encounter for preprocedural laboratory examination: Secondary | ICD-10-CM | POA: Diagnosis not present

## 2019-07-03 DIAGNOSIS — H02152 Paralytic ectropion of right lower eyelid: Secondary | ICD-10-CM | POA: Diagnosis not present

## 2019-07-03 DIAGNOSIS — H02102 Unspecified ectropion of right lower eyelid: Secondary | ICD-10-CM | POA: Diagnosis not present

## 2019-07-03 HISTORY — PX: EYE SURGERY: SHX253

## 2019-07-09 DIAGNOSIS — L281 Prurigo nodularis: Secondary | ICD-10-CM | POA: Diagnosis not present

## 2019-07-09 DIAGNOSIS — H61892 Other specified disorders of left external ear: Secondary | ICD-10-CM | POA: Diagnosis not present

## 2019-07-14 NOTE — Progress Notes (Signed)
Cardiology Office Note:    Date:  07/15/2019   ID:  Gregory Santiago, DOB Jun 10, 1951, MRN [id number]  PCP:  Gregory Rail, MD  Cardiologist:  Gregory More, MD    Referring MD: Gregory Rail, MD    ASSESSMENT:    1. Coronary artery calcification seen on CAT scan   2. Other hyperlipidemia   3. 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 See discussion under history no evidence of ischemia I asked him to transition to high intensity statin achieve LDL less than 70. 2. Switch to rosuvastatin follow-up lipid profile CMP and LPa 6 weeks 3. Stable he feels markedly improved  I spent 20 minutes with patient and his wife reviewing his coronary artery calcification the prognostic importance of it the results of his echocardiogram cardiac CTA the necessity of lipid-lowering therapy therapy and the options of treatment to achieve target LDL.  Next appointment: 1 year however he will return my office in 6 weeks to check lipid profile screen for lipoprotein a excess and check liver function   Medication Adjustments/Labs and Tests Ordered: Current medicines are reviewed at length with the patient today.  Concerns regarding medicines are outlined above.  Orders Placed This Encounter  Procedures  . Lipoprotein A (LPA)  . Lipid Profile  . Comprehensive Metabolic Panel (CMET)   Meds ordered this encounter  Medications  . rosuvastatin (CRESTOR) 20 MG tablet    Sig: Take 1 tablet (20 mg total) by mouth daily.    Dispense:  90 tablet    Refill:  3    No chief complaint on file.   History of Present Illness:    Gregory Santiago is a 68 y.o. male with a hx of CAC on CT scan last seen 06/03/2019. He had a recent PET/CT : Heavy coronary artery calcification. Mild scattered atherosclerotic calcification involving the aortoiliac vessels. Remote cardiac testing of stress echo 10-11 years ago. PMH GERD, HLD, Bell's palsy, and salivary gland  cancer. Recently underwent salivary gland cancer treated with chemo, radiation, surgery. Toxil, Carvo as chemotherapy agents as adjunct to radiation.   Compliance with diet, lifestyle and medications: Yes  His wife participated by face time on the patient's phone.  He is feeling better but still is fatigued after treatment for his cancer.  He has no shortness of breath edema chest pain palpitation or syncope.  We had a nice opportunity to walk away through his echocardiogram which shows normal left and right ventricular function no evidence of cardiomyopathy he has valvular calcification but no dysfunction of the valve.  His myocardial perfusion study was read as mildly diminished ejection fraction, however his ejection fraction function and perfusion were all normal.  He is reassured by this finding I do not think he requires coronary angiography.  He seeks my opinion regarding treating lipids his LDL remains markedly elevated and intermediate intensity statin and I told him I think the best opportunity for achieving target is a high intensity statin and he will transition to rosuvastatin 20 mg daily.  He may require a second agent either Zetia or PCSK9 to achieve LDLs less than 70.  In the absence of anginal discomfort I will see back in the office in 1 year.  With the patient and wife a very hike healthcare literacy and was a pleasure to interact with the patient and his wife Past Medical History:  Diagnosis Date  .  Allergic rhinitis   . AR (allergic rhinitis) 10/28/2018  . Coronary artery calcification seen on CAT scan 05/22/2019  . Cranial nerve VII palsy   . Duodenitis   . Ear pressure, right 02/27/2019  . Family history of diabetes mellitus in mother 05/21/2019  . Fundic gland polyps of stomach, benign   . GERD (gastroesophageal reflux disease)   . Hemorrhoids   . Hepatitis A   . 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 HLD (hyperlipidemia)   . Knee pain 04/23/2018  . Oropharyngeal  dysphagia 06/02/2019  . 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  . 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/02/2019  . Stye 05/22/2019    Past Surgical History:  Procedure Laterality Date  . arm surgery     left, ligaments and tendon repair, accident  . EYE SURGERY  11/2018   eye lid repair  . EYE SURGERY  07/03/2019   eye lid surgery  . MENISCUS REPAIR Right 2018  . NASAL SEPTUM SURGERY    . SALIVARY GLAND SURGERY Right    Nov 2019    Current Medications: Current Meds  Medication Sig  . cyclobenzaprine (FLEXERIL) 5 MG tablet Take 1 tablet (5 mg total) by mouth 3 (three) times daily as needed for muscle spasms.  Marland Kitchen erythromycin ophthalmic ointment Place 1 application into the right eye at bedtime.  . fluticasone (FLONASE) 50 MCG/ACT nasal spray Place 2 sprays into both nostrils daily.  . hydrocortisone (ANUSOL-HC) 2.5 % rectal cream Place 1 application rectally 2 (two) times daily as needed for hemorrhoids or itching. Keep on file - does not need filled now  . Multiple Vitamin (MULTIVITAMIN) tablet Take 1 tablet by mouth daily.  Marland Kitchen omeprazole (PRILOSEC) 40 MG capsule TAKE 1 CAPSULE BY MOUTH DAILY  . [DISCONTINUED] simvastatin (ZOCOR) 20 MG tablet Take 1 tablet (20 mg total) by mouth at bedtime.     Allergies:   Patient has no known allergies.   Social History   Socioeconomic History  . Marital status: Married    Spouse name: Not on file  . Number of children: 3  . Years of education: 1 year of college  . Highest education level: Some  college, no degree  Occupational History  . Occupation: Programmer, systems: [employer]  . Financial resource strain: Not on file  . Food insecurity    Worry: Not on file    Inability: Not on file  . Transportation needs    Medical: Not on file    Non-medical: Not on file  Tobacco Use  . Smoking status: Former Smoker    Packs/day: 0.50    Years: 5.00    Pack years: 2.50    Types: Cigarettes    Quit date: 12/24/1982    Years since quitting: 36.5  . Smokeless tobacco: Never Used  Substance and Sexual Activity  . Alcohol use: Yes    Comment: social  . Drug use: No  . Sexual activity: Not on file  Lifestyle  . Physical activity    Days per week: Not on file  Minutes per session: Not on file  . Stress: Not on file  Relationships  . Social Herbalist on phone: Not on file    Gets together: Not on file    Attends religious service: Not on file    Active member of club or organization: Not on file    Attends meetings of clubs or organizations: Not on file    Relationship status: Not on file  Other Topics Concern  . Not on file  Social History Narrative   Exercise: not currently running   Lives at home with wife    Right handed   Caffeine: 6 cups daily     Family History: The patient's family history includes Arthritis in his father, mother, and paternal grandmother; Colon polyps in his mother; Diabetes in his mother; Heart attack in his paternal grandfather; Heart disease in his paternal grandfather; Lymphoma in his father; Parkinson's disease in his mother; Prostate cancer in his father. ROS:   Please see the history of present illness.    All other systems reviewed and are negative.  EKGs/Labs/Other Studies Reviewed:    The following studies were reviewed today:  Echo 06/12/2019 :  1. The left ventricle has normal systolic function with an ejection fraction of 60-65%. The cavity size was normal. Left ventricular diastolic  parameters were normal.  2. The right ventricle has normal systolic function. The cavity was normal. There is no increase in right ventricular wall thickness.  3. The mitral valve is degenerative. Mild calcification of the anterior mitral valve leaflet. No evidence of mitral valve stenosis.  4. The aortic valve is tricuspid. Mild thickening of the aortic valve. Mild calcification of the aortic valve.  5. The aortic root, ascending aorta and aortic arch are normal in size and structure.  MPI 06/03/2019: Study Highlights   The left ventricular ejection fraction is mildly decreased (45-54%).  Nuclear stress EF: 54%.  No T wave inversion was noted during stress.  There was no ST segment deviation noted during stress.  This is a low risk study. No reversible ischemia. LVEF 54% with normal wall motion. This is a low risk study.    Recent Labs: 05/22/2019: ALT 14; BUN 14; Creatinine, Ser 0.92; Hemoglobin 14.9; Platelets 175.0; Potassium 4.3; Sodium 140; TSH 2.08  Recent Lipid Panel    Component Value Date/Time   CHOL 219 (H) 05/22/2019 0830   TRIG 130.0 05/22/2019 0830   HDL 40.00 05/22/2019 0830   CHOLHDL 5 05/22/2019 0830   VLDL 26.0 05/22/2019 0830   LDLCALC 153 (H) 05/22/2019 0830   LDLDIRECT 155.9 01/29/2014 1028    Physical Exam:    VS:  BP 120/86 (BP Location: Right Arm, Patient Position: Sitting, Cuff Size: Normal)   Pulse 82   Temp (!) 97 F (36.1 C)   Ht 5\' 11"  (1.803 m)   Wt 180 lb (81.6 kg)   SpO2 96%   BMI 25.10 kg/m     Wt Readings from Last 3 Encounters:  07/15/19 180 lb (81.6 kg)  06/10/19 175 lb (79.4 kg)  06/03/19 175 lb (79.4 kg)     GEN: He appears much healthier and normal well nourished, well developed in no acute distress HEENT: Normal NECK: No JVD; No carotid bruits LYMPHATICS: No lymphadenopathy CARDIAC: RRR, no murmurs, rubs, gallops RESPIRATORY:  Clear to auscultation without rales, wheezing or rhonchi  ABDOMEN: Soft, non-tender,  non-distended MUSCULOSKELETAL:  No edema; No deformity  SKIN: Warm and dry NEUROLOGIC:  Alert and  oriented x 3 PSYCHIATRIC:  Normal affect    Signed, Gregory More, MD  07/15/2019 5:50 PM    [place]

## 2019-07-15 ENCOUNTER — Ambulatory Visit (INDEPENDENT_AMBULATORY_CARE_PROVIDER_SITE_OTHER): Payer: BC Managed Care – PPO | Admitting: Cardiology

## 2019-07-15 ENCOUNTER — Other Ambulatory Visit: Payer: Self-pay

## 2019-07-15 ENCOUNTER — Encounter: Payer: Self-pay | Admitting: Cardiology

## 2019-07-15 VITALS — BP 120/86 | HR 82 | Temp 97.0°F | Ht 71.0 in | Wt 180.0 lb

## 2019-07-15 DIAGNOSIS — I2510 Atherosclerotic heart disease of native coronary artery without angina pectoris: Secondary | ICD-10-CM

## 2019-07-15 DIAGNOSIS — C089 Malignant neoplasm of major salivary gland, unspecified: Secondary | ICD-10-CM | POA: Diagnosis not present

## 2019-07-15 DIAGNOSIS — E7849 Other hyperlipidemia: Secondary | ICD-10-CM

## 2019-07-15 MED ORDER — ROSUVASTATIN CALCIUM 20 MG PO TABS: 20.0000 mg | ORAL_TABLET | Freq: Every day | ORAL | 3 refills | Status: DC

## 2019-07-15 NOTE — Patient Instructions (Signed)
Medication Instructions:  Your physician has recommended you make the following change in your medication:   STOP : Simvastatin  START: Rosuvastatin (Crestor) 20 mg: Take 1 tab daily at dinner  If you need a refill on your cardiac medications before your next appointment, please call your pharmacy.   Lab work: Your physician recommends that you return for lab work in: [place] A   If you have labs (blood work) drawn today and your tests are completely normal, you will receive your results only by: Marland Kitchen MyChart Message (if you have MyChart) OR . A paper copy in the mail If you have any lab test that is abnormal or we need to change your treatment, we will call you to review the results.  Testing/Procedures: None  Follow-Up: At [facility], you and your health needs are our priority.  As part of our continuing mission to provide you with exceptional heart care, we have created designated Provider Care Teams.  These Care Teams include your primary Cardiologist (physician) and Advanced Practice Providers (APPs -  Physician Assistants and Nurse Practitioners) who all work together to provide you with the care you need, when you need it. You will need a follow up appointment in 1 years.  Please call our office 2 months in advance to schedule this appointment.  You may see No primary care provider on file. or another member of our [employer] in [place]: Jenne Campus, MD . Jyl Heinz, MD  Any Other Special Instructions Will Be Listed Below (If Applicable).

## 2019-07-28 DIAGNOSIS — I2584 Coronary atherosclerosis due to calcified coronary lesion: Secondary | ICD-10-CM | POA: Diagnosis not present

## 2019-07-28 DIAGNOSIS — G518 Other disorders of facial nerve: Secondary | ICD-10-CM | POA: Diagnosis not present

## 2019-07-28 DIAGNOSIS — Z85818 Personal history of malignant neoplasm of other sites of lip, oral cavity, and pharynx: Secondary | ICD-10-CM | POA: Diagnosis not present

## 2019-07-28 DIAGNOSIS — H6121 Impacted cerumen, right ear: Secondary | ICD-10-CM | POA: Diagnosis not present

## 2019-07-28 DIAGNOSIS — C07 Malignant neoplasm of parotid gland: Secondary | ICD-10-CM | POA: Diagnosis not present

## 2019-07-28 DIAGNOSIS — Z87891 Personal history of nicotine dependence: Secondary | ICD-10-CM | POA: Diagnosis not present

## 2019-07-28 DIAGNOSIS — Z8509 Personal history of malignant neoplasm of other digestive organs: Secondary | ICD-10-CM | POA: Diagnosis not present

## 2019-07-31 DIAGNOSIS — Z20828 Contact with and (suspected) exposure to other viral communicable diseases: Secondary | ICD-10-CM | POA: Diagnosis not present

## 2019-07-31 DIAGNOSIS — Z7189 Other specified counseling: Secondary | ICD-10-CM | POA: Diagnosis not present

## 2019-08-03 DIAGNOSIS — Z923 Personal history of irradiation: Secondary | ICD-10-CM | POA: Diagnosis not present

## 2019-08-03 DIAGNOSIS — Z9221 Personal history of antineoplastic chemotherapy: Secondary | ICD-10-CM | POA: Diagnosis not present

## 2019-08-03 DIAGNOSIS — Z9889 Other specified postprocedural states: Secondary | ICD-10-CM | POA: Diagnosis not present

## 2019-08-03 DIAGNOSIS — C760 Malignant neoplasm of head, face and neck: Secondary | ICD-10-CM | POA: Diagnosis not present

## 2019-08-03 DIAGNOSIS — Z87891 Personal history of nicotine dependence: Secondary | ICD-10-CM | POA: Diagnosis not present

## 2019-08-03 DIAGNOSIS — H6121 Impacted cerumen, right ear: Secondary | ICD-10-CM | POA: Diagnosis not present

## 2019-08-03 DIAGNOSIS — Z85818 Personal history of malignant neoplasm of other sites of lip, oral cavity, and pharynx: Secondary | ICD-10-CM | POA: Diagnosis not present

## 2019-08-03 DIAGNOSIS — H73891 Other specified disorders of tympanic membrane, right ear: Secondary | ICD-10-CM | POA: Diagnosis not present

## 2019-08-05 DIAGNOSIS — H2513 Age-related nuclear cataract, bilateral: Secondary | ICD-10-CM | POA: Diagnosis not present

## 2019-08-05 DIAGNOSIS — H16211 Exposure keratoconjunctivitis, right eye: Secondary | ICD-10-CM | POA: Diagnosis not present

## 2019-08-05 DIAGNOSIS — H02202 Unspecified lagophthalmos right lower eyelid: Secondary | ICD-10-CM | POA: Diagnosis not present

## 2019-08-05 DIAGNOSIS — H02201 Unspecified lagophthalmos right upper eyelid: Secondary | ICD-10-CM | POA: Diagnosis not present

## 2019-08-12 ENCOUNTER — Encounter: Payer: Self-pay | Admitting: Internal Medicine

## 2019-08-12 MED ORDER — CYCLOBENZAPRINE HCL 5 MG PO TABS: 5.0000 mg | ORAL_TABLET | Freq: Three times a day (TID) | ORAL | 3 refills | Status: DC | PRN

## 2019-08-24 DIAGNOSIS — I2510 Atherosclerotic heart disease of native coronary artery without angina pectoris: Secondary | ICD-10-CM | POA: Diagnosis not present

## 2019-08-24 DIAGNOSIS — E7849 Other hyperlipidemia: Secondary | ICD-10-CM | POA: Diagnosis not present

## 2019-08-25 LAB — LIPID PANEL
Chol/HDL Ratio: 3.9 ratio (ref 0.0–5.0)
Cholesterol, Total: 160 mg/dL (ref 100–199)
HDL: 41 mg/dL (ref >39)
LDL Chol Calc (NIH): 100 mg/dL — ABNORMAL HIGH (ref 0–99)
Triglycerides: 106 mg/dL (ref 0–149)
VLDL Cholesterol Cal: 19 mg/dL (ref 5–40)

## 2019-08-26 LAB — COMPREHENSIVE METABOLIC PANEL
ALT: 16 IU/L (ref 0–44)
AST: 18 IU/L (ref 0–40)
Albumin/Globulin Ratio: 2.2 (ref 1.2–2.2)
Albumin: 4.7 g/dL (ref 3.8–4.8)
Alkaline Phosphatase: 70 IU/L (ref 39–117)
BUN/Creatinine Ratio: 12 (ref 10–24)
BUN: 12 mg/dL (ref 8–27)
Bilirubin Total: 0.5 mg/dL (ref 0.0–1.2)
CO2: 26 mmol/L (ref 20–29)
Calcium: 9.7 mg/dL (ref 8.6–10.2)
Chloride: 102 mmol/L (ref 96–106)
Creatinine, Ser: 0.97 mg/dL (ref 0.76–1.27)
GFR calc Af Amer: 94 mL/min/{1.73_m2} (ref >59)
GFR calc non Af Amer: 81 mL/min/{1.73_m2} (ref >59)
Globulin, Total: 2.1 g/dL (ref 1.5–4.5)
Glucose: 92 mg/dL (ref 65–99)
Potassium: 4.3 mmol/L (ref 3.5–5.2)
Sodium: 140 mmol/L (ref 134–144)
Total Protein: 6.8 g/dL (ref 6.0–8.5)

## 2019-08-26 LAB — LIPOPROTEIN A (LPA): Lipoprotein (a): 261 nmol/L — ABNORMAL HIGH (ref <75.0)

## 2019-08-28 ENCOUNTER — Telehealth: Payer: Self-pay

## 2019-08-28 DIAGNOSIS — E7849 Other hyperlipidemia: Secondary | ICD-10-CM

## 2019-08-28 DIAGNOSIS — E782 Mixed hyperlipidemia: Secondary | ICD-10-CM

## 2019-08-28 MED ORDER — REPATHA SURECLICK 140 MG/ML ~~LOC~~ SOAJ: 140.0000 mg | SUBCUTANEOUS | 0 refills | Status: DC

## 2019-08-28 NOTE — Telephone Encounter (Signed)
Patient's wife Gregory Santiago notified of lab results per Dr Bettina Gavia.  Patient's wife advised that patient should start repatha 140mg  every 14 days. Referral to lipid clinic was ordered. Gregory Santiago advised to have patient recheck lipid and Lipoprotein A 6 weeks after starting Repatha.  Gregory Santiago agreed to plan and verbalized understanding.  No further questions.

## 2019-09-03 DIAGNOSIS — J0100 Acute maxillary sinusitis, unspecified: Secondary | ICD-10-CM | POA: Diagnosis not present

## 2019-09-03 DIAGNOSIS — H90A22 Sensorineural hearing loss, unilateral, left ear, with restricted hearing on the contralateral side: Secondary | ICD-10-CM | POA: Diagnosis not present

## 2019-09-03 DIAGNOSIS — D490 Neoplasm of unspecified behavior of digestive system: Secondary | ICD-10-CM | POA: Diagnosis not present

## 2019-09-03 DIAGNOSIS — R42 Dizziness and giddiness: Secondary | ICD-10-CM | POA: Diagnosis not present

## 2019-09-03 DIAGNOSIS — H814 Vertigo of central origin: Secondary | ICD-10-CM | POA: Diagnosis not present

## 2019-09-03 DIAGNOSIS — Z9114 Patient's other noncompliance with medication regimen: Secondary | ICD-10-CM | POA: Diagnosis not present

## 2019-09-03 DIAGNOSIS — H60312 Diffuse otitis externa, left ear: Secondary | ICD-10-CM | POA: Diagnosis not present

## 2019-09-17 ENCOUNTER — Encounter: Payer: Self-pay | Admitting: Pharmacist Clinician (PhC)/ Clinical Pharmacy Specialist

## 2019-09-17 ENCOUNTER — Telehealth: Payer: BC Managed Care – PPO | Admitting: Pharmacist Clinician (PhC)/ Clinical Pharmacy Specialist

## 2019-09-17 DIAGNOSIS — Z23 Encounter for immunization: Secondary | ICD-10-CM | POA: Diagnosis not present

## 2019-09-17 DIAGNOSIS — E7849 Other hyperlipidemia: Secondary | ICD-10-CM

## 2019-09-17 NOTE — Progress Notes (Signed)
09/17/2019 Gregory Santiago 02-03-1951 [id number]   HPI:  Gregory Santiago is a 68 y.o. male patient of Dr Bettina Gavia, who presents today for a lipid clinic evaluation.  We spoke by phone after failure of the MyChart video conference connection.    Patient saw Dr. Bettina Gavia in July to review his prior tests.  He had a head/neck PET scan at [facility] in May that noted "Heavy coronary artery calcification".  An echocardiogram noted some mild calcification of mitral and aortic valves, but otherwise normal, with EF 60-65%  And a Lexiscan Myoview was defined as a low risk study.  In addition to lipid profile, a lipoprotein A was drawn, coming in elevated at 261.    Patient complains today about hearing loss in left ear.  He has previous hearing damage in the right ear after treatments for cancer, however notes that the hearing loss in his left ear occurred later, after starting rosuvastatin.  Patient and wife did research on the internet and believe that rosuvastatin is ototoxic and the cause of this.  He will be seeing ENT on Monday for evaluation.  They are agreeable to re-starting statin should the ENT determine it not to be the issue.     Current Medications: rosuvastatin 20 mg qd - stopped yesterday  Cholesterol Goals: LDL < 70   Intolerant/previously tried: rosuvastatin - unsure if tied to hearing loss, seeing ENT Monday  Family history:  Refresh teargel  Diet: wife states they eat healthy, but admit to the occasional dietary indiscretion.  Vegetables are frozen, not canned, variety of proteins.  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 cyclobenzaprine (FLEXERIL) 5 MG tablet Take 1 tablet (5 mg total) by mouth 3 (three) times daily as needed for muscle spasms. 30 tablet 3  . fluticasone (FLONASE) 50 MCG/ACT nasal spray Place 2 sprays into both  nostrils daily.    . hydrocortisone (ANUSOL-HC) 2.5 % rectal cream Place 1 application rectally 2 (two) times daily as needed for hemorrhoids or itching. Keep on file - does not need filled now 30 g 5  . Multiple Vitamin (MULTIVITAMIN) tablet Take 1 tablet by mouth daily.    Marland Kitchen omeprazole (PRILOSEC) 40 MG capsule TAKE 1 CAPSULE BY MOUTH DAILY 90 capsule 3   No current facility-administered medications for this visit.     No Known Allergies  Past Medical History:  Diagnosis Date  . Allergic rhinitis   . AR (allergic rhinitis) 10/28/2018  . Coronary artery calcification seen on CAT scan 05/22/2019  . Cranial nerve VII palsy   . Duodenitis   . Ear pressure, right 02/27/2019  . Family history of diabetes mellitus in mother 05/21/2019  . Fundic gland polyps of stomach, benign   . GERD (gastroesophageal reflux disease)   . Hemorrhoids   . Hepatitis A   . 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 HLD (hyperlipidemia)   . Knee pain 04/23/2018  . Oropharyngeal dysphagia 06/02/2019  . 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  . 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/02/2019  . Stye 05/22/2019    There were no vitals taken for this visit.   HLD (hyperlipidemia) Patient with elevated cholesterol and lipoprotein A levels, currently not on medication.  Stopped his rosuvastatin due to potential ototoxicity.  At this time, patient would not qualify for PCSK-9 inhibitor due to lack of diagnosed ASCVD.  While he did show "heavy coronary calcium" on PET scan  and an elevated Lipoprotein A, at this point it is difficult to get insurance companies to accept those as ASCVD.  Discussed the option of ezetimibe and or bempedoic acid.  Reviewed mechanisms of action, side effects and other pertinent issues with patient and spouse.  Answered all questions.   They are going to think about their options, and will call later next week after having met with ENT, before moving forward.     Tommy Medal PharmD CPP [facility]

## 2019-09-17 NOTE — Assessment & Plan Note (Addendum)
Patient with elevated cholesterol and lipoprotein A levels, currently not on medication.  Stopped his rosuvastatin due to potential ototoxicity.  At this time, patient would not qualify for PCSK-9 inhibitor due to lack of diagnosed ASCVD.  While he did show "heavy coronary calcium" on PET scan and an elevated Lipoprotein A, at this point it is difficult to get insurance companies to accept those as ASCVD.  Discussed the option of ezetimibe and or bempedoic acid.  Reviewed mechanisms of action, side effects and other pertinent issues with patient and spouse.  Answered all questions.   They are going to think about their options, and will call later next week after having met with ENT, before moving forward.

## 2019-09-18 DIAGNOSIS — Z20828 Contact with and (suspected) exposure to other viral communicable diseases: Secondary | ICD-10-CM | POA: Diagnosis not present

## 2019-09-18 DIAGNOSIS — Z7189 Other specified counseling: Secondary | ICD-10-CM | POA: Diagnosis not present

## 2019-09-21 DIAGNOSIS — H903 Sensorineural hearing loss, bilateral: Secondary | ICD-10-CM | POA: Diagnosis not present

## 2019-09-21 DIAGNOSIS — C07 Malignant neoplasm of parotid gland: Secondary | ICD-10-CM | POA: Diagnosis not present

## 2019-09-21 DIAGNOSIS — H9120 Sudden idiopathic hearing loss, unspecified ear: Secondary | ICD-10-CM | POA: Diagnosis not present

## 2019-09-30 DIAGNOSIS — H903 Sensorineural hearing loss, bilateral: Secondary | ICD-10-CM | POA: Diagnosis not present

## 2019-10-01 DIAGNOSIS — H903 Sensorineural hearing loss, bilateral: Secondary | ICD-10-CM | POA: Diagnosis not present

## 2019-10-01 DIAGNOSIS — Z8509 Personal history of malignant neoplasm of other digestive organs: Secondary | ICD-10-CM | POA: Diagnosis not present

## 2019-10-01 DIAGNOSIS — Z9089 Acquired absence of other organs: Secondary | ICD-10-CM | POA: Diagnosis not present

## 2019-10-01 DIAGNOSIS — H6121 Impacted cerumen, right ear: Secondary | ICD-10-CM | POA: Diagnosis not present

## 2019-10-01 DIAGNOSIS — H9041 Sensorineural hearing loss, unilateral, right ear, with unrestricted hearing on the contralateral side: Secondary | ICD-10-CM | POA: Diagnosis not present

## 2019-10-01 DIAGNOSIS — G518 Other disorders of facial nerve: Secondary | ICD-10-CM | POA: Diagnosis not present

## 2019-10-05 ENCOUNTER — Telehealth: Payer: Self-pay | Admitting: Pharmacist Clinician (PhC)/ Clinical Pharmacy Specialist

## 2019-10-05 NOTE — Telephone Encounter (Signed)
Wife called regarding follow up after lipid consult.  They had believed Crestor to be the cause of his hearing loss.  Went for follow up with audiology and was told hearing loss not related to Crestor, but rather a dormant virus coming active.    Patient has since restarted Crestor 20 mg qd.  Will have him repeat lipid, hepatic and Lp(a) in mid-December.    Wife agreeable with plan.

## 2019-10-08 DIAGNOSIS — Z9221 Personal history of antineoplastic chemotherapy: Secondary | ICD-10-CM | POA: Diagnosis not present

## 2019-10-08 DIAGNOSIS — R5383 Other fatigue: Secondary | ICD-10-CM | POA: Diagnosis not present

## 2019-10-08 DIAGNOSIS — Z8509 Personal history of malignant neoplasm of other digestive organs: Secondary | ICD-10-CM | POA: Diagnosis not present

## 2019-10-08 DIAGNOSIS — R29810 Facial weakness: Secondary | ICD-10-CM | POA: Diagnosis not present

## 2019-10-08 DIAGNOSIS — H903 Sensorineural hearing loss, bilateral: Secondary | ICD-10-CM | POA: Diagnosis not present

## 2019-10-08 DIAGNOSIS — C07 Malignant neoplasm of parotid gland: Secondary | ICD-10-CM | POA: Diagnosis not present

## 2019-10-13 DIAGNOSIS — J3489 Other specified disorders of nose and nasal sinuses: Secondary | ICD-10-CM | POA: Diagnosis not present

## 2019-10-13 DIAGNOSIS — R29810 Facial weakness: Secondary | ICD-10-CM | POA: Diagnosis not present

## 2019-10-13 DIAGNOSIS — J34829 Nasal valve collapse, unspecified: Secondary | ICD-10-CM

## 2019-10-13 DIAGNOSIS — M950 Acquired deformity of nose: Secondary | ICD-10-CM | POA: Insufficient documentation

## 2019-10-13 HISTORY — DX: Nasal valve collapse, unspecified: J34.829

## 2019-10-15 DIAGNOSIS — Z923 Personal history of irradiation: Secondary | ICD-10-CM | POA: Diagnosis not present

## 2019-10-15 DIAGNOSIS — Z8509 Personal history of malignant neoplasm of other digestive organs: Secondary | ICD-10-CM | POA: Diagnosis not present

## 2019-10-15 DIAGNOSIS — Z87891 Personal history of nicotine dependence: Secondary | ICD-10-CM | POA: Diagnosis not present

## 2019-10-15 DIAGNOSIS — Z85858 Personal history of malignant neoplasm of other endocrine glands: Secondary | ICD-10-CM | POA: Diagnosis not present

## 2019-10-15 DIAGNOSIS — Z9221 Personal history of antineoplastic chemotherapy: Secondary | ICD-10-CM | POA: Diagnosis not present

## 2019-10-15 DIAGNOSIS — Z7952 Long term (current) use of systemic steroids: Secondary | ICD-10-CM | POA: Diagnosis not present

## 2019-10-15 DIAGNOSIS — H903 Sensorineural hearing loss, bilateral: Secondary | ICD-10-CM | POA: Diagnosis not present

## 2019-10-22 DIAGNOSIS — H9042 Sensorineural hearing loss, unilateral, left ear, with unrestricted hearing on the contralateral side: Secondary | ICD-10-CM | POA: Diagnosis not present

## 2019-10-22 DIAGNOSIS — H903 Sensorineural hearing loss, bilateral: Secondary | ICD-10-CM | POA: Diagnosis not present

## 2019-11-18 DIAGNOSIS — H02201 Unspecified lagophthalmos right upper eyelid: Secondary | ICD-10-CM | POA: Diagnosis not present

## 2019-11-18 DIAGNOSIS — H16211 Exposure keratoconjunctivitis, right eye: Secondary | ICD-10-CM | POA: Diagnosis not present

## 2019-11-18 DIAGNOSIS — H2513 Age-related nuclear cataract, bilateral: Secondary | ICD-10-CM | POA: Diagnosis not present

## 2019-11-18 DIAGNOSIS — H02202 Unspecified lagophthalmos right lower eyelid: Secondary | ICD-10-CM | POA: Diagnosis not present

## 2019-11-24 DIAGNOSIS — C07 Malignant neoplasm of parotid gland: Secondary | ICD-10-CM | POA: Diagnosis not present

## 2019-12-03 DIAGNOSIS — R1312 Dysphagia, oropharyngeal phase: Secondary | ICD-10-CM | POA: Diagnosis not present

## 2019-12-03 DIAGNOSIS — Z9889 Other specified postprocedural states: Secondary | ICD-10-CM | POA: Diagnosis not present

## 2019-12-03 DIAGNOSIS — Z923 Personal history of irradiation: Secondary | ICD-10-CM | POA: Diagnosis not present

## 2019-12-03 DIAGNOSIS — C07 Malignant neoplasm of parotid gland: Secondary | ICD-10-CM | POA: Diagnosis not present

## 2019-12-03 DIAGNOSIS — Z9221 Personal history of antineoplastic chemotherapy: Secondary | ICD-10-CM | POA: Diagnosis not present

## 2019-12-09 DIAGNOSIS — H9042 Sensorineural hearing loss, unilateral, left ear, with unrestricted hearing on the contralateral side: Secondary | ICD-10-CM | POA: Diagnosis not present

## 2019-12-09 DIAGNOSIS — H6121 Impacted cerumen, right ear: Secondary | ICD-10-CM | POA: Diagnosis not present

## 2019-12-09 DIAGNOSIS — H903 Sensorineural hearing loss, bilateral: Secondary | ICD-10-CM | POA: Diagnosis not present

## 2019-12-09 DIAGNOSIS — Z8509 Personal history of malignant neoplasm of other digestive organs: Secondary | ICD-10-CM | POA: Diagnosis not present

## 2019-12-09 DIAGNOSIS — Z9221 Personal history of antineoplastic chemotherapy: Secondary | ICD-10-CM | POA: Diagnosis not present

## 2020-01-04 DIAGNOSIS — E782 Mixed hyperlipidemia: Secondary | ICD-10-CM | POA: Diagnosis not present

## 2020-01-04 DIAGNOSIS — E7849 Other hyperlipidemia: Secondary | ICD-10-CM | POA: Diagnosis not present

## 2020-01-05 LAB — LIPID PANEL
Chol/HDL Ratio: 4.1 ratio (ref 0.0–5.0)
Cholesterol, Total: 175 mg/dL (ref 100–199)
HDL: 43 mg/dL (ref >39)
LDL Chol Calc (NIH): 110 mg/dL — ABNORMAL HIGH (ref 0–99)
Triglycerides: 121 mg/dL (ref 0–149)
VLDL Cholesterol Cal: 22 mg/dL (ref 5–40)

## 2020-01-05 LAB — LIPOPROTEIN A (LPA): Lipoprotein (a): 504.6 nmol/L — ABNORMAL HIGH (ref <75.0)

## 2020-01-18 DIAGNOSIS — R29810 Facial weakness: Secondary | ICD-10-CM | POA: Diagnosis not present

## 2020-01-18 DIAGNOSIS — Z01812 Encounter for preprocedural laboratory examination: Secondary | ICD-10-CM | POA: Diagnosis not present

## 2020-01-18 DIAGNOSIS — Z20822 Contact with and (suspected) exposure to covid-19: Secondary | ICD-10-CM | POA: Diagnosis not present

## 2020-01-23 ENCOUNTER — Ambulatory Visit: Payer: BC Managed Care – PPO

## 2020-01-25 DIAGNOSIS — G510 Bell's palsy: Secondary | ICD-10-CM | POA: Diagnosis not present

## 2020-01-25 DIAGNOSIS — R29810 Facial weakness: Secondary | ICD-10-CM | POA: Diagnosis not present

## 2020-01-25 DIAGNOSIS — J3489 Other specified disorders of nose and nasal sinuses: Secondary | ICD-10-CM | POA: Diagnosis not present

## 2020-01-25 DIAGNOSIS — C07 Malignant neoplasm of parotid gland: Secondary | ICD-10-CM | POA: Diagnosis not present

## 2020-01-28 ENCOUNTER — Ambulatory Visit: Payer: BC Managed Care – PPO

## 2020-01-28 DIAGNOSIS — Z510 Encounter for antineoplastic radiation therapy: Secondary | ICD-10-CM | POA: Diagnosis not present

## 2020-01-28 DIAGNOSIS — C07 Malignant neoplasm of parotid gland: Secondary | ICD-10-CM | POA: Diagnosis not present

## 2020-01-31 ENCOUNTER — Ambulatory Visit: Payer: BC Managed Care – PPO

## 2020-02-01 ENCOUNTER — Ambulatory Visit: Payer: Medicare Other

## 2020-02-04 ENCOUNTER — Other Ambulatory Visit: Payer: Self-pay

## 2020-02-04 ENCOUNTER — Ambulatory Visit (INDEPENDENT_AMBULATORY_CARE_PROVIDER_SITE_OTHER): Payer: BC Managed Care – PPO | Admitting: Internal Medicine

## 2020-02-04 ENCOUNTER — Encounter: Payer: Self-pay | Admitting: Internal Medicine

## 2020-02-04 VITALS — BP 136/82 | HR 75 | Ht 71.0 in | Wt 184.0 lb

## 2020-02-04 DIAGNOSIS — I2510 Atherosclerotic heart disease of native coronary artery without angina pectoris: Secondary | ICD-10-CM | POA: Diagnosis not present

## 2020-02-04 DIAGNOSIS — E7841 Elevated Lipoprotein(a): Secondary | ICD-10-CM

## 2020-02-04 DIAGNOSIS — E782 Mixed hyperlipidemia: Secondary | ICD-10-CM

## 2020-02-04 NOTE — Patient Instructions (Signed)
Medication Instructions:  Continue current therapy - will tailor treatment plan based on calcium score results   *If you need a refill on your cardiac medications before your next appointment, please call your pharmacy*  Lab Work: FASTING lab work in 3-4 months prior to appointment with Dr. Debara Pickett   If you have labs (blood work) drawn today and your tests are completely normal, you will receive your results only by: Marland Kitchen MyChart Message (if you have MyChart) OR . A paper copy in the mail If you have any lab test that is abnormal or we need to change your treatment, we will call you to review the results.  Testing/Procedures: Dr. Debara Pickett has ordered a CT coronary calcium score. This test is done at [address]. This is $150 out of pocket.   Coronary CalciumScan A coronary calcium scan is an imaging test used to look for deposits of calcium and other fatty materials (plaques) in the inner lining of the blood vessels of the heart (coronary arteries). These deposits of calcium and plaques can partly clog and narrow the coronary arteries without producing any symptoms or warning signs. This puts a person at risk for a heart attack. This test can detect these deposits before symptoms develop. Tell a health care provider about:  Any allergies you have.  All medicines you are taking, including vitamins, herbs, eye drops, creams, and over-the-counter medicines.  Any problems you or family members have had with anesthetic medicines.  Any blood disorders you have.  Any surgeries you have had.  Any medical conditions you have.  Whether you are pregnant or may be pregnant. What are the risks? Generally, this is a safe procedure. However, problems may occur, including:  Harm to a pregnant woman and her unborn baby. This test involves the use of radiation. Radiation exposure can be dangerous to a pregnant woman and her unborn baby. If you are pregnant, you generally should not  have this procedure done.  Slight increase in the risk of cancer. This is because of the radiation involved in the test. What happens before the procedure? No preparation is needed for this procedure. What happens during the procedure?  You will undress and remove any jewelry around your neck or chest.  You will put on a hospital gown.  Sticky electrodes will be placed on your chest. The electrodes will be connected to an electrocardiogram (ECG) machine to record a tracing of the electrical activity of your heart.  A CT scanner will take pictures of your heart. During this time, you will be asked to lie still and hold your breath for 2-3 seconds while a picture of your heart is being taken. The procedure may vary among health care providers and hospitals. What happens after the procedure?  You can get dressed.  You can return to your normal activities.  It is up to you to get the results of your test. Ask your health care provider, or the department that is doing the test, when your results will be ready. Summary  A coronary calcium scan is an imaging test used to look for deposits of calcium and other fatty materials (plaques) in the inner lining of the blood vessels of the heart (coronary arteries).  Generally, this is a safe procedure. Tell your health care provider if you are pregnant or may be pregnant.  No preparation is needed for this procedure.  A CT scanner will take pictures of your heart.  You can return to  your normal activities after the scan is done. This information is not intended to replace advice given to you by your health care provider. Make sure you discuss any questions you have with your health care provider. Document Released: 06/07/2008 Document Revised: 10/29/2016 Document Reviewed: 10/29/2016 Elsevier Interactive Patient Education  2017 [place]: At [facility], you and your health needs are our priority.  As part of our  continuing mission to provide you with exceptional heart care, we have created designated Provider Care Teams.  These Care Teams include your primary Cardiologist (physician) and Advanced Practice Providers (APPs -  Physician Assistants and Nurse Practitioners) who all work together to provide you with the care you need, when you need it.  Your next appointment:   3-4 month(s)  The format for your next appointment:   Either In Person or Virtual  Provider:   Raliegh Ip Mali Hilty, MD  Other Instructions

## 2020-02-04 NOTE — Progress Notes (Signed)
LIPID CLINIC CONSULT NOTE  Chief Complaint:  Dyslipidemia  Primary Care Physician: Binnie Rail, MD  Primary Cardiologist:  No primary care provider on file.  HPI:  Gregory Santiago is a 69 y.o. male who is being seen today for the evaluation of dyslipidemia at the request of Dr. Bettina Gavia.  This is a pleasant 69 year old male kindly referred for evaluation management of dyslipidemia.  He has been a patient of Dr. Bettina Gavia and recently was referred to the lipid clinic.  He was seen by Nehemiah Massed, PharmD, due to the fact that a recent noncardiac CT scan of the chest demonstrated multivessel coronary calcification.  He underwent nuclear stress testing for this which was negative for ischemia.  It was recommended that he start on a potency statin therapy and was placed on rosuvastatin 20 mg daily.  He was previously on a atorvastatin 20 mg daily.  Labs prior to this showed an LDL of 100.  Additionally he was noted to have an elevated LP(a) of 261.  Based on this he was referred for consideration of PCSK9 inhibitor therapy.  It was not felt that he may qualify for PCSK9 based on coronary artery calcification alone.  He subsequently underwent repeat lipid testing 1 month ago which actually showed small increase in his LDL cholesterol from 100-110.  He reports compliance with the medication.  Interestingly, he is LP (a) also doubled up to 504.  He does report heart disease in his family including his father.  Recently he has been undergoing treatment for salivary gland cancer at [facility] and recently had facial reconstructive surgery.  PMHx:  Past Medical History:  Diagnosis Date  . Allergic rhinitis   . AR (allergic rhinitis) 10/28/2018  . Coronary artery calcification seen on CAT scan 05/22/2019  . Cranial nerve VII palsy   . Duodenitis   . Ear pressure, right 02/27/2019  . Family history of diabetes mellitus in mother 05/21/2019  . Fundic gland polyps of stomach, benign   . GERD  (gastroesophageal reflux disease)   . Hemorrhoids   . Hepatitis A   . 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 HLD (hyperlipidemia)   . Knee pain 04/23/2018  . Oropharyngeal dysphagia 06/02/2019  . 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  . 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/02/2019  . Stye 05/22/2019    Past Surgical History:  Procedure Laterality Date  . arm surgery     left, ligaments and tendon repair, accident  . EYE SURGERY  11/2018   eye lid repair  . EYE SURGERY  07/03/2019   eye lid surgery  . MENISCUS REPAIR Right 2018  . NASAL SEPTUM SURGERY    . SALIVARY GLAND SURGERY Right    Nov 2019    FAMHx:  Family History  Problem Relation Age of Onset  . Prostate cancer Father   . Arthritis Father   . Lymphoma Father   . Colon polyps Mother   . Diabetes Mother   . Parkinson's disease Mother   . Arthritis Mother   . Arthritis Paternal Grandmother   . Heart  attack Paternal Grandfather   . Heart disease Paternal Grandfather     SOCHx:   reports that he quit smoking about 37 years ago. His smoking use included cigarettes. He has a 2.50 pack-year smoking history. He has never used smokeless tobacco. He reports current alcohol use. He reports that he does not use drugs.  ALLERGIES:  No Known Allergies  ROS: Pertinent items noted in HPI and remainder of comprehensive ROS otherwise negative.  HOME MEDS: Current Outpatient Medications on File Prior to Visit  Medication Sig Dispense Refill  . cyclobenzaprine (FLEXERIL) 5  MG tablet Take 1 tablet (5 mg total) by mouth 3 (three) times daily as needed for muscle spasms. 30 tablet 3  . fluticasone (FLONASE) 50 MCG/ACT nasal spray Place 2 sprays into both nostrils daily.    . hydrocortisone (ANUSOL-HC) 2.5 % rectal cream Place 1 application rectally 2 (two) times daily as needed for hemorrhoids or itching. Keep on file - does not need filled now 30 g 5  . Multiple Vitamin (MULTIVITAMIN) tablet Take 1 tablet by mouth daily.    Marland Kitchen omeprazole (PRILOSEC) 40 MG capsule TAKE 1 CAPSULE BY MOUTH DAILY 90 capsule 3  . rosuvastatin (CRESTOR) 20 MG tablet Take 20 mg by mouth daily.     No current facility-administered medications on file prior to visit.    LABS/IMAGING: No results found for this or any previous visit (from the past 48 hour(s)). No results found.  LIPID PANEL:    Component Value Date/Time   CHOL 175 01/04/2020 1714   TRIG 121 01/04/2020 1714   HDL 43 01/04/2020 1714   CHOLHDL 4.1 01/04/2020 1714   CHOLHDL 5 05/22/2019 0830   VLDL 26.0 05/22/2019 0830   LDLCALC 110 (H) 01/04/2020 1714   LDLDIRECT 155.9 01/29/2014 1028    WEIGHTS: Wt Readings from Last 3 Encounters:  02/04/20 184 lb (83.5 kg)  07/15/19 180 lb (81.6 kg)  06/10/19 175 lb (79.4 kg)    VITALS: BP 136/82   Pulse 75   Ht 5\' 11"  (1.803 m)   Wt 184 lb (83.5 kg)   SpO2 97%   BMI 25.66 kg/m   EXAM: Deferred  EKG: Deferred  ASSESSMENT: 1. Mixed dyslipidemia goal LDL less than 70 2. Multivessel coronary calcification 3. Family history of heart disease  PLAN: 1.   Gregory Santiago has multivessel coronary calcification and a high LP(a).  Interestingly he had recent increase in LP(a) after switching his statin therapy.  He has not had significant reduction in LDL cholesterol rather it is higher.  There may be dietary reasons for this as he has been compliant with his medication.  There are several studies which show that statin therapy can in some cases increase LP(a).  In addition this  is noted to be an acute phase reactant and may be increased secondary to systemic inflammatory disorders or perhaps his underlying malignancy.  It seems however that is mostly well treated at this point.  Generally, this is why serial LP(a) measurements are not encouraged.  Ultimately, I would like to further characterize the extent of his coronary artery calcification with a dedicated CT calcium score.  This should be low radiation although we have to be mindful of his other CT scans at [facility].  It is helpful however to quantitate his calcium score and would be useful for an argument for PCSK9 inhibitor since his LDL has not reached target despite high potency statin therapy, I do think he would qualify however a high  coronary calcium score would be helpful.  Alternatively he may be a candidate for clinical trials, namely the VESALIUS study - although this is randomized to placebo as a possibility.  Thanks for the kind referral.  Pixie Casino, MD, FACC, [place] Director of the [facility] Lipid [facility]  Cardiovascular Risk Reduction Clinic Diplomate of the American Board of Clinical Lipidology Attending Cardiologist  Direct Dial: [phone]  Fax: [phone]  Website:  [email or web address] 02/04/2020, 4:12 PM

## 2020-02-09 DIAGNOSIS — I2510 Atherosclerotic heart disease of native coronary artery without angina pectoris: Secondary | ICD-10-CM | POA: Diagnosis not present

## 2020-02-09 DIAGNOSIS — I2584 Coronary atherosclerosis due to calcified coronary lesion: Secondary | ICD-10-CM | POA: Diagnosis not present

## 2020-02-09 DIAGNOSIS — R5383 Other fatigue: Secondary | ICD-10-CM | POA: Diagnosis not present

## 2020-02-09 DIAGNOSIS — C07 Malignant neoplasm of parotid gland: Secondary | ICD-10-CM | POA: Diagnosis not present

## 2020-02-09 DIAGNOSIS — Z483 Aftercare following surgery for neoplasm: Secondary | ICD-10-CM | POA: Diagnosis not present

## 2020-02-18 ENCOUNTER — Other Ambulatory Visit: Payer: Self-pay

## 2020-02-18 ENCOUNTER — Ambulatory Visit (INDEPENDENT_AMBULATORY_CARE_PROVIDER_SITE_OTHER)
Admission: RE | Admit: 2020-02-18 | Discharge: 2020-02-18 | Disposition: A | Discharge status: Home / Self Care | Payer: Self-pay | Source: Ambulatory Visit | Attending: Internal Medicine | Admitting: Internal Medicine

## 2020-02-18 DIAGNOSIS — I2510 Atherosclerotic heart disease of native coronary artery without angina pectoris: Secondary | ICD-10-CM

## 2020-02-18 DIAGNOSIS — E782 Mixed hyperlipidemia: Secondary | ICD-10-CM

## 2020-02-19 ENCOUNTER — Telehealth: Payer: Self-pay | Admitting: Internal Medicine

## 2020-02-19 NOTE — Telephone Encounter (Signed)
PA for Repatha submitted via [place] (Key: [id number])

## 2020-02-22 NOTE — Telephone Encounter (Addendum)
Repatha approved Effective from 02/19/2020 through 02/17/2021  Patient notified via [place]

## 2020-02-23 DIAGNOSIS — Z03818 Encounter for observation for suspected exposure to other biological agents ruled out: Secondary | ICD-10-CM | POA: Diagnosis not present

## 2020-02-24 MED ORDER — REPATHA SURECLICK 140 MG/ML ~~LOC~~ SOAJ: 1.0000 | SUBCUTANEOUS | 11 refills | Status: DC

## 2020-02-24 NOTE — Telephone Encounter (Signed)
Rx sent to [facility]

## 2020-02-24 NOTE — Addendum Note (Signed)
Addended by: Fidel Levy on: 02/24/2020 08:00 AM   Modules accepted: Orders

## 2020-02-25 DIAGNOSIS — I2584 Coronary atherosclerosis due to calcified coronary lesion: Secondary | ICD-10-CM | POA: Diagnosis not present

## 2020-02-25 DIAGNOSIS — C07 Malignant neoplasm of parotid gland: Secondary | ICD-10-CM | POA: Diagnosis not present

## 2020-02-25 DIAGNOSIS — I2510 Atherosclerotic heart disease of native coronary artery without angina pectoris: Secondary | ICD-10-CM | POA: Diagnosis not present

## 2020-02-25 DIAGNOSIS — K117 Disturbances of salivary secretion: Secondary | ICD-10-CM | POA: Diagnosis not present

## 2020-02-25 DIAGNOSIS — R5383 Other fatigue: Secondary | ICD-10-CM | POA: Diagnosis not present

## 2020-03-11 DIAGNOSIS — H61891 Other specified disorders of right external ear: Secondary | ICD-10-CM | POA: Diagnosis not present

## 2020-03-11 DIAGNOSIS — H903 Sensorineural hearing loss, bilateral: Secondary | ICD-10-CM | POA: Diagnosis not present

## 2020-03-11 DIAGNOSIS — Z85818 Personal history of malignant neoplasm of other sites of lip, oral cavity, and pharynx: Secondary | ICD-10-CM | POA: Diagnosis not present

## 2020-03-11 DIAGNOSIS — H90A22 Sensorineural hearing loss, unilateral, left ear, with restricted hearing on the contralateral side: Secondary | ICD-10-CM | POA: Diagnosis not present

## 2020-03-11 DIAGNOSIS — Z9221 Personal history of antineoplastic chemotherapy: Secondary | ICD-10-CM | POA: Diagnosis not present

## 2020-03-11 DIAGNOSIS — H6121 Impacted cerumen, right ear: Secondary | ICD-10-CM | POA: Diagnosis not present

## 2020-03-11 DIAGNOSIS — G510 Bell's palsy: Secondary | ICD-10-CM | POA: Diagnosis not present

## 2020-03-11 DIAGNOSIS — H61899 Other specified disorders of external ear, unspecified ear: Secondary | ICD-10-CM | POA: Diagnosis not present

## 2020-03-29 ENCOUNTER — Other Ambulatory Visit: Payer: Self-pay

## 2020-03-29 MED ORDER — ROSUVASTATIN CALCIUM 20 MG PO TABS: 20.0000 mg | ORAL_TABLET | Freq: Every day | ORAL | 2 refills | Status: DC

## 2020-03-30 ENCOUNTER — Other Ambulatory Visit: Payer: Self-pay

## 2020-03-30 MED ORDER — OMEPRAZOLE 40 MG PO CPDR: DELAYED_RELEASE_CAPSULE | ORAL | 0 refills | Status: DC

## 2020-04-05 DIAGNOSIS — Z03818 Encounter for observation for suspected exposure to other biological agents ruled out: Secondary | ICD-10-CM | POA: Diagnosis not present

## 2020-04-28 DIAGNOSIS — I2510 Atherosclerotic heart disease of native coronary artery without angina pectoris: Secondary | ICD-10-CM | POA: Diagnosis not present

## 2020-04-28 DIAGNOSIS — E782 Mixed hyperlipidemia: Secondary | ICD-10-CM | POA: Diagnosis not present

## 2020-04-29 LAB — LIPID PANEL
Chol/HDL Ratio: 2 ratio (ref 0.0–5.0)
Cholesterol, Total: 86 mg/dL — ABNORMAL LOW (ref 100–199)
HDL: 43 mg/dL (ref >39)
LDL Chol Calc (NIH): 28 mg/dL (ref 0–99)
Triglycerides: 68 mg/dL (ref 0–149)
VLDL Cholesterol Cal: 15 mg/dL (ref 5–40)

## 2020-05-03 DIAGNOSIS — H02152 Paralytic ectropion of right lower eyelid: Secondary | ICD-10-CM | POA: Diagnosis not present

## 2020-05-05 ENCOUNTER — Other Ambulatory Visit: Payer: Self-pay

## 2020-05-05 ENCOUNTER — Ambulatory Visit (INDEPENDENT_AMBULATORY_CARE_PROVIDER_SITE_OTHER): Payer: BC Managed Care – PPO | Admitting: Internal Medicine

## 2020-05-05 ENCOUNTER — Encounter: Payer: Self-pay | Admitting: Internal Medicine

## 2020-05-05 VITALS — BP 132/90 | HR 61 | Ht 71.0 in | Wt 185.2 lb

## 2020-05-05 DIAGNOSIS — E782 Mixed hyperlipidemia: Secondary | ICD-10-CM

## 2020-05-05 DIAGNOSIS — I2510 Atherosclerotic heart disease of native coronary artery without angina pectoris: Secondary | ICD-10-CM

## 2020-05-05 DIAGNOSIS — E7841 Elevated Lipoprotein(a): Secondary | ICD-10-CM | POA: Diagnosis not present

## 2020-05-05 NOTE — Progress Notes (Signed)
LIPID CLINIC CONSULT NOTE  Chief Complaint:  Follow-up dyslipidemia  Primary Care Physician: Binnie Rail, MD  Primary Cardiologist:  No primary care provider on file.  HPI:  Gregory Santiago is a 69 y.o. male who is being seen today for the evaluation of dyslipidemia at the request of Dr. Bettina Gavia.  This is a pleasant 69 year old male kindly referred for evaluation management of dyslipidemia.  He has been a patient of Dr. Bettina Gavia and recently was referred to the lipid clinic.  He was seen by Nehemiah Massed, PharmD, due to the fact that a recent noncardiac CT scan of the chest demonstrated multivessel coronary calcification.  He underwent nuclear stress testing for this which was negative for ischemia.  It was recommended that he start on a potency statin therapy and was placed on rosuvastatin 20 mg daily.  He was previously on a atorvastatin 20 mg daily.  Labs prior to this showed an LDL of 100.  Additionally he was noted to have an elevated LP(a) of 261.  Based on this he was referred for consideration of PCSK9 inhibitor therapy.  It was not felt that he may qualify for PCSK9 based on coronary artery calcification alone.  He subsequently underwent repeat lipid testing 1 month ago which actually showed small increase in his LDL cholesterol from 100-110.  He reports compliance with the medication.  Interestingly, he is LP (a) also doubled up to 504.  He does report heart disease in his family including his father.  Recently he has been undergoing treatment for salivary gland cancer at [facility] and recently had facial reconstructive surgery.  05/05/2020  Gregory Santiago is seen today for follow-up.  He is responded well to [place].  Total cholesterol was 175 now down to 86.  Triglycerides decreased from 1 21-68.  HDL stable at 43 and LDL decreased from 110 down to 28.  His LP(a) had been significantly elevated.  A coronary calcium score was performed which is demonstrating significant multivessel  coronary calcium with a score of 1603.  He did have nuclear stress testing last year which was negative for ischemia as well as an echo which showed normal heart function however there was some mitral annular and aortic valve calcification, not unexpected with elevated LP(a).  PMHx:  Past Medical History:  Diagnosis Date  . Allergic rhinitis   . AR (allergic rhinitis) 10/28/2018  . Coronary artery calcification seen on CAT scan 05/22/2019  . Cranial nerve VII palsy   . Duodenitis   . Ear pressure, right 02/27/2019  . Family history of diabetes mellitus in mother 05/21/2019  . Fundic gland polyps of stomach, benign   . GERD (gastroesophageal reflux disease)   . Hemorrhoids   . Hepatitis A   . 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 HLD (hyperlipidemia)   . Knee pain 04/23/2018  . Oropharyngeal dysphagia 06/02/2019  . 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  . 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/02/2019  . Stye 05/22/2019    Past Surgical History:  Procedure Laterality Date  . arm surgery     left,  ligaments and tendon repair, accident  . EYE SURGERY  11/2018   eye lid repair  . EYE SURGERY  07/03/2019   eye lid surgery  . MENISCUS REPAIR Right 2018  . NASAL SEPTUM SURGERY    . SALIVARY GLAND SURGERY Right    Nov 2019    FAMHx:  Family History  Problem Relation Age of Onset  . Prostate cancer Father   . Arthritis Father   . Lymphoma Father   . Colon polyps Mother   . Diabetes Mother   . Parkinson's disease Mother   .  Arthritis Mother   . Arthritis Paternal Grandmother   . Heart attack Paternal Grandfather   . Heart disease Paternal Grandfather     SOCHx:   reports that he quit smoking about 37 years ago. His smoking use included cigarettes. He has a 2.50 pack-year smoking history. He has never used smokeless tobacco. He reports current alcohol use. He reports that he does not use drugs.  ALLERGIES:  No Known Allergies  ROS: Pertinent items noted in HPI and remainder of comprehensive ROS otherwise negative.  HOME MEDS: Current Outpatient Medications on File Prior to Visit  Medication Sig Dispense Refill  . cyclobenzaprine (FLEXERIL) 5 MG tablet Take 1 tablet (5 mg total) by mouth 3 (three) times daily as needed for muscle spasms. 30 tablet 3  . Evolocumab (REPATHA SURECLICK) XX123456 MG/ML SOAJ Inject 1 Dose into the skin every 14 (fourteen) days. 2 pen 11  . fluticasone (FLONASE) 50 MCG/ACT nasal spray Place 2 sprays into both nostrils daily.    . hydrocortisone (ANUSOL-HC) 2.5 % rectal cream Place 1 application rectally 2 (two) times daily as needed for hemorrhoids or itching. Keep on file - does not need filled now 30 g 5  . Multiple Vitamin (MULTIVITAMIN) tablet Take 1 tablet by mouth daily.    Marland Kitchen omeprazole (PRILOSEC) 40 MG capsule TAKE 1 CAPSULE BY MOUTH DAILY 90 capsule 0  . rosuvastatin (CRESTOR) 20 MG tablet Take 1 tablet (20 mg total) by mouth daily. 90 tablet 2  . sodium fluoride (FLUORISHIELD) 1.1 % GEL dental gel USE ONCE DAILY AFTER BRUSHING. PLACE IN TRAY FOR 5 MINUTES. DO NOT RINSE OR EAT 30 MINUTES AFTER APPLICATION     No current facility-administered medications on file prior to visit.    LABS/IMAGING: No results found for this or any previous visit (from the past 48 hour(s)). No results found.  LIPID PANEL:    Component Value Date/Time   CHOL 86 (L) 04/28/2020 0856   TRIG 68 04/28/2020 0856   HDL 43 04/28/2020 0856   CHOLHDL 2.0 04/28/2020 0856   CHOLHDL 5 05/22/2019 0830    VLDL 26.0 05/22/2019 0830   LDLCALC 28 04/28/2020 0856   LDLDIRECT 155.9 01/29/2014 1028    WEIGHTS: Wt Readings from Last 3 Encounters:  05/05/20 185 lb 3.2 oz (84 kg)  02/04/20 184 lb (83.5 kg)  07/15/19 180 lb (81.6 kg)    VITALS: BP 132/90   Pulse 61   Ht 5\' 11"  (1.803 m)   Wt 185 lb 3.2 oz (84 kg)   SpO2 99%   BMI 25.83 kg/m   EXAM: Deferred  EKG: Deferred  ASSESSMENT: 1. Mixed dyslipidemia goal LDL less than 70 2. Multivessel coronary calcification 3. Family history of heart disease 4. 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   Gregory Santiago has significant multivessel coronary disease and a very high LP(a).  He is responded well to [place] with total cholesterol now less  than 100 and LDL of 28.  I had also like to refer him to [facility], as a clinical trial company in town who is doing a study on lowering LP(a) with an antisense oligonucleotide to LP(a).  He would be randomized either to placebo or treatment plus standard of care.  Pre-trial data shows 80% reduction in LP(a) with few side effects.  Plan follow-up with me annually or sooner as necessary.  I have encouraged him to continue to follow with you for general cardiology needs.  Thank you for allowing me to participate in his care.  Pixie Casino, MD, [facility], [place] Director of the Advanced Lipid Disorders &  Cardiovascular Risk Reduction Clinic Diplomate of the American Board of Clinical Lipidology Attending Cardiologist  Direct Dial: [phone]  Fax: [phone]  Website:  [email or web address] 05/05/2020, 9:33 AM

## 2020-05-05 NOTE — Patient Instructions (Signed)
Medication Instructions:  Your physician recommends that you continue on your current medications as directed. Please refer to the Current Medication list given to you today.  *If you need a refill on your cardiac medications before your next appointment, please call your pharmacy*   Lab Work: FASTING lab work in 1 year to check cholesterol Please complete before your next lipid clinic follow up   If you have labs (blood work) drawn today and your tests are completely normal, you will receive your results only by: Marland Kitchen MyChart Message (if you have MyChart) OR . A paper copy in the mail If you have any lab test that is abnormal or we need to change your treatment, we will call you to review the results.   Testing/Procedures: NONE   Follow-Up: At [facility], you and your health needs are our priority.  As part of our continuing mission to provide you with exceptional heart care, we have created designated Provider Care Teams.  These Care Teams include your primary Cardiologist (physician) and Advanced Practice Providers (APPs -  Physician Assistants and Nurse Practitioners) who all work together to provide you with the care you need, when you need it.  We recommend signing up for the patient portal called "MyChart".  Sign up information is provided on this After Visit Summary.  MyChart is used to connect with patients for Virtual Visits (Telemedicine).  Patients are able to view lab/test results, encounter notes, upcoming appointments, etc.  Non-urgent messages can be sent to your provider as well.   To learn more about what you can do with MyChart, go to [email or web address].    Your next appointment:   12 month(s) - lipid clinic  The format for your next appointment:   Either In Person or Virtual  Provider:   Raliegh Ip Mali Hilty, MD   Other Instructions Reinaldo Meeker, Pharm. D., CPP Vice President of Clinical Studies Medication Management Anti-LPa  trial [phone] [email or web address]

## 2020-05-10 DIAGNOSIS — C07 Malignant neoplasm of parotid gland: Secondary | ICD-10-CM | POA: Diagnosis not present

## 2020-05-25 ENCOUNTER — Encounter: Payer: Self-pay | Admitting: Cardiology

## 2020-05-25 DIAGNOSIS — H16211 Exposure keratoconjunctivitis, right eye: Secondary | ICD-10-CM | POA: Diagnosis not present

## 2020-05-25 DIAGNOSIS — H02202 Unspecified lagophthalmos right lower eyelid: Secondary | ICD-10-CM | POA: Diagnosis not present

## 2020-05-25 DIAGNOSIS — H02201 Unspecified lagophthalmos right upper eyelid: Secondary | ICD-10-CM | POA: Diagnosis not present

## 2020-05-25 DIAGNOSIS — H2513 Age-related nuclear cataract, bilateral: Secondary | ICD-10-CM | POA: Diagnosis not present

## 2020-05-27 ENCOUNTER — Encounter: Payer: BC Managed Care – PPO | Admitting: Internal Medicine

## 2020-05-27 DIAGNOSIS — H02152 Paralytic ectropion of right lower eyelid: Secondary | ICD-10-CM | POA: Diagnosis not present

## 2020-05-27 DIAGNOSIS — H04201 Unspecified epiphora, right lacrimal gland: Secondary | ICD-10-CM | POA: Diagnosis not present

## 2020-06-02 NOTE — Patient Instructions (Addendum)
Blood work was ordered.    All other Health Maintenance issues reviewed.   All recommended immunizations and age-appropriate screenings are up-to-date or discussed.  No immunization administered today.   Medications reviewed and updated.  Changes include :   Start meloxicam as needed  Your prescription(s) have been submitted to your pharmacy. Please take as directed and contact our office if you believe you are having problem(s) with the medication(s).  A referral was ordered for  GI    Someone will call you to schedule an appointment.    Please followup in 1 year    Health Maintenance, Male Adopting a healthy lifestyle and getting preventive care are important in promoting health and wellness. Ask your health care provider about:  The right schedule for you to have regular tests and exams.  Things you can do on your own to prevent diseases and keep yourself healthy. What should I know about diet, weight, and exercise? Eat a healthy diet   Eat a diet that includes plenty of vegetables, fruits, low-fat dairy products, and lean protein.  Do not eat a lot of foods that are high in solid fats, added sugars, or sodium. Maintain a healthy weight Body mass index (BMI) is a measurement that can be used to identify possible weight problems. It estimates body fat based on height and weight. Your health care provider can help determine your BMI and help you achieve or maintain a healthy weight. Get regular exercise Get regular exercise. This is one of the most important things you can do for your health. Most adults should:  Exercise for at least 150 minutes each week. The exercise should increase your heart rate and make you sweat (moderate-intensity exercise).  Do strengthening exercises at least twice a week. This is in addition to the moderate-intensity exercise.  Spend less time sitting. Even light physical activity can be beneficial. Watch cholesterol and blood lipids Have your  blood tested for lipids and cholesterol at 68 years of age, then have this test every 5 years. You may need to have your cholesterol levels checked more often if:  Your lipid or cholesterol levels are high.  You are older than 68 years of age.  You are at high risk for heart disease. What should I know about cancer screening? Many types of cancers can be detected early and may often be prevented. Depending on your health history and family history, you may need to have cancer screening at various ages. This may include screening for:  Colorectal cancer.  Prostate cancer.  Skin cancer.  Lung cancer. What should I know about heart disease, diabetes, and high blood pressure? Blood pressure and heart disease  High blood pressure causes heart disease and increases the risk of stroke. This is more likely to develop in people who have high blood pressure readings, are of African descent, or are overweight.  Talk with your health care provider about your target blood pressure readings.  Have your blood pressure checked: ? Every 3-5 years if you are 43-57 years of age. ? Every year if you are 32 years old or older.  If you are between the ages of 7 and 37 and are a current or former smoker, ask your health care provider if you should have a one-time screening for abdominal aortic aneurysm (AAA). Diabetes Have regular diabetes screenings. This checks your fasting blood sugar level. Have the screening done:  Once every three years after age 22 if you are at a normal weight  and have a low risk for diabetes.  More often and at a younger age if you are overweight or have a high risk for diabetes. What should I know about preventing infection? Hepatitis B If you have a higher risk for hepatitis B, you should be screened for this virus. Talk with your health care provider to find out if you are at risk for hepatitis B infection. Hepatitis C Blood testing is recommended for:  Everyone born  from [birth year] through 1964.  Anyone with known risk factors for hepatitis C. Sexually transmitted infections (STIs)  You should be screened each year for STIs, including gonorrhea and chlamydia, if: ? You are sexually active and are younger than 68 years of age. ? You are older than 68 years of age and your health care provider tells you that you are at risk for this type of infection. ? Your sexual activity has changed since you were last screened, and you are at increased risk for chlamydia or gonorrhea. Ask your health care provider if you are at risk.  Ask your health care provider about whether you are at high risk for HIV. Your health care provider may recommend a prescription medicine to help prevent HIV infection. If you choose to take medicine to prevent HIV, you should first get tested for HIV. You should then be tested every 3 months for as long as you are taking the medicine. Follow these instructions at home: Lifestyle  Do not use any products that contain nicotine or tobacco, such as cigarettes, e-cigarettes, and chewing tobacco. If you need help quitting, ask your health care provider.  Do not use street drugs.  Do not share needles.  Ask your health care provider for help if you need support or information about quitting drugs. Alcohol use  Do not drink alcohol if your health care provider tells you not to drink.  If you drink alcohol: ? Limit how much you have to 0-2 drinks a day. ? Be aware of how much alcohol is in your drink. In the [place] one drink equals one 12 oz bottle of beer (355 mL), one 5 oz glass of wine (148 mL), or one 1 oz glass of hard liquor (44 mL). General instructions  Schedule regular health, dental, and eye exams.  Stay current with your vaccines.  Tell your health care provider if: ? You often feel depressed. ? You have ever been abused or do not feel safe at home. Summary  Adopting a healthy lifestyle and getting preventive care are  important in promoting health and wellness.  Follow your health care provider's instructions about healthy diet, exercising, and getting tested or screened for diseases.  Follow your health care provider's instructions on monitoring your cholesterol and blood pressure. This information is not intended to replace advice given to you by your health care provider. Make sure you discuss any questions you have with your health care provider. Document Revised: 12/03/2018 Document Reviewed: 12/03/2018 Elsevier Patient Education  2020 [employer].

## 2020-06-02 NOTE — Progress Notes (Signed)
Subjective:    Patient ID: Gregory Santiago, male    DOB: [birth year], 69 y.o.   MRN: [id number]  HPI  He is here for a physical exam.      He is taking all of his medications as prescribed.    He is active, but not exercising regularly.    He had sudden left sided hearing loss.  He has had some reconstructive surgery on his face.    He is having increased joint pain. It is his hands, R shoulder and R knee.  His GM and dad had RA.    Medications and allergies reviewed with patient and updated if appropriate.  Patient Active Problem List   Diagnosis Date Noted  . Sudden left hearing loss 06/03/2020  . Osteoarthritis 06/03/2020  . Muscle spasms of neck 06/03/2020  . Encounter for cardiac risk counseling 06/03/2019  . Oropharyngeal dysphagia 06/02/2019  . 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/02/2019  . Coronary artery calcification seen on CAT scan 05/22/2019  . Stye 05/22/2019  . Family history of diabetes mellitus in mother 05/21/2019  . 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  . 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 AR (allergic rhinitis) 10/28/2018  . 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 Knee pain 04/23/2018  . GERD (gastroesophageal reflux disease)   . HLD (hyperlipidemia)     Current Outpatient Medications on File Prior to Visit  Medication Sig Dispense Refill  . erythromycin ophthalmic ointment Place into the right eye in the morning and at bedtime for 7 days.    . Evolocumab (REPATHA SURECLICK) 607 MG/ML SOAJ Inject 1 Dose into the skin every 14 (fourteen) days. 2 pen 11  . fluticasone (FLONASE) 50 MCG/ACT nasal spray Place 2 sprays into both nostrils daily.    . hydrocortisone (ANUSOL-HC) 2.5 % rectal cream Place 1 application rectally 2 (two) times daily as needed for  hemorrhoids or itching. Keep on file - does not need filled now 30 g 5  . Multiple Vitamin (MULTIVITAMIN) tablet Take 1 tablet by mouth daily.    . rosuvastatin (CRESTOR) 20 MG tablet Take 1 tablet (20 mg total) by mouth daily. 90 tablet 2  . sodium fluoride (FLUORISHIELD) 1.1 % GEL dental gel USE ONCE DAILY AFTER BRUSHING. PLACE IN TRAY FOR 5 MINUTES. DO NOT RINSE OR EAT 30 MINUTES AFTER APPLICATION     No current facility-administered medications on file prior to visit.    Past Medical History:  Diagnosis Date  . Allergic rhinitis   . AR (allergic rhinitis) 10/28/2018  . Coronary artery calcification seen on CAT scan 05/22/2019  . Cranial nerve VII palsy   . Duodenitis   . Ear pressure, right 02/27/2019  . Family history of diabetes mellitus in mother 05/21/2019  . Fundic gland polyps of stomach, benign   . GERD (gastroesophageal reflux disease)   . Hemorrhoids   . Hepatitis A   . 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 HLD (hyperlipidemia)   . Knee pain 04/23/2018  . Oropharyngeal dysphagia 06/02/2019  . 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  . 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/02/2019  . Stye 05/22/2019    Past Surgical History:  Procedure Laterality Date  . arm surgery     left, ligaments and tendon repair, accident  . EYE SURGERY  11/2018   eye lid repair  . EYE SURGERY  07/03/2019   eye lid surgery  . MENISCUS REPAIR Right 2018  . NASAL SEPTUM SURGERY    . SALIVARY GLAND SURGERY Right    Nov 2019    Social History    Socioeconomic History  . Marital status: Married    Spouse name: Not on file  . Number of children: 3  . Years of education: 1 year of college  . Highest education level: Some college, no degree  Occupational History  . Occupation: Programmer, systems: [employer]  Tobacco Use  . Smoking status: Former Smoker    Packs/day: 0.50    Years: 5.00    Pack years: 2.50    Types: Cigarettes    Quit date: 12/24/1982    Years since quitting: 37.4  . Smokeless tobacco: Never Used  Vaping Use  . Vaping Use: Never used  Substance and Sexual Activity  . Alcohol use: Yes    Comment: social  . Drug use: No  . Sexual activity: Not on file  Other Topics Concern  . Not on file  Social History Narrative   Exercise: not currently running   Lives at home with wife    Right handed   Caffeine: 6 cups daily   Social Determinants of Health   Financial Resource Strain:   . Difficulty of Paying Living Expenses:   Food Insecurity:   . Worried About Charity fundraiser in the Last Year:   . Arboriculturist in the Last Year:   Transportation Needs:   . Film/video editor (Medical):   Marland Kitchen Lack of Transportation (Non-Medical):   Physical Activity:   . Days of Exercise per Week:   . Minutes of Exercise per Session:   Stress:   . Feeling of Stress :   Social Connections:   . Frequency of Communication with Friends and Family:   . Frequency of Social Gatherings with Friends and Family:   . Attends Religious Services:   . Active Member of Clubs or Organizations:   . Attends Archivist Meetings:   Marland Kitchen Marital Status:     Family History  Problem Relation Age of Onset  . Prostate cancer Father   . Arthritis Father   . Lymphoma Father   . Colon polyps Mother   . Diabetes Mother   . Parkinson's disease Mother   . Arthritis Mother   . Arthritis Paternal Grandmother   . Heart attack Paternal Grandfather   . Heart disease Paternal Grandfather     Review of Systems   Constitutional: Negative for chills and fever.  HENT: Positive for ear pain.   Eyes: Negative for visual disturbance.  Respiratory: Negative for cough, shortness of breath and wheezing.   Cardiovascular: Negative for chest pain, palpitations and leg swelling.  Gastrointestinal: Negative for abdominal pain, blood in stool, constipation, diarrhea and nausea.  Musculoskeletal: Positive for neck pain (spams at surgery site).  Skin: Negative for rash.  Neurological: Positive for numbness (chronic in hands from prior surgery) and headaches (sinus). Negative for dizziness and light-headedness.  Psychiatric/Behavioral: Negative for dysphoric mood. The patient is not nervous/anxious.        Objective:   Vitals:   06/03/20 0804  BP: 134/80  Pulse: 66  Temp: 98.4 F (36.9 C)  SpO2: 96%   Filed Weights   06/03/20 0804  Weight: 179 lb (81.2 kg)   Body mass index is 24.97 kg/m.  BP Readings from Last 3 Encounters:  06/03/20 134/80  05/05/20 132/90  02/04/20 136/82    Wt Readings from Last 3 Encounters:  06/03/20 179 lb (81.2 kg)  05/05/20 185 lb 3.2 oz (84 kg)  02/04/20 184 lb (83.5 kg)     Physical Exam Constitutional: He appears well-developed and well-nourished. No distress.  HENT:  Head: Normocephalic and atraumatic.  Eyes: Conjunctivae normal.  Neck: Neck supple. No tracheal deviation present. No thyromegaly present.  No carotid bruit  Cardiovascular: Normal rate, regular rhythm, normal heart sounds and intact distal pulses.   No murmur heard. Pulmonary/Chest: Effort normal and breath sounds normal. No respiratory distress. He has no wheezes. He has no rales.  Abdominal: Soft. He exhibits no distension. There is no tenderness.  GU: normal prostate size s/o nodules Musculoskeletal: He exhibits no edema.  Lymphadenopathy:   He has no cervical adenopathy.  Skin: Skin is warm and dry. He is not diaphoretic.  Psychiatric: He has a normal mood and affect. His behavior is  normal.         Assessment & Plan:   Physical exam: Screening blood work Immunizations  Up to date - Pneumovax - postponed - last given 2109 - wait 5 years Colonoscopy-  Due - will refer Eye exams  Up to date  Exercise  Active no regular exercise - start regular exercise Weight  Normal BMI Substance abuse  none  See Problem List for Assessment and Plan of chronic medical problems.     See Problem List for Assessment and Plan of chronic medical problems.    This visit occurred during the SARS-CoV-2 public health emergency.  Safety protocols were in place, including screening questions prior to the visit, additional usage of staff PPE, and extensive cleaning of exam room while observing appropriate contact time as indicated for disinfecting solutions.

## 2020-06-03 ENCOUNTER — Encounter: Payer: Self-pay | Admitting: Internal Medicine

## 2020-06-03 ENCOUNTER — Other Ambulatory Visit: Payer: Self-pay

## 2020-06-03 ENCOUNTER — Ambulatory Visit (INDEPENDENT_AMBULATORY_CARE_PROVIDER_SITE_OTHER): Payer: BC Managed Care – PPO | Admitting: Internal Medicine

## 2020-06-03 VITALS — BP 134/80 | HR 66 | Temp 98.4°F | Ht 71.0 in | Wt 179.0 lb

## 2020-06-03 DIAGNOSIS — I2510 Atherosclerotic heart disease of native coronary artery without angina pectoris: Secondary | ICD-10-CM

## 2020-06-03 DIAGNOSIS — Z0000 Encounter for general adult medical examination without abnormal findings: Secondary | ICD-10-CM | POA: Diagnosis not present

## 2020-06-03 DIAGNOSIS — H9122 Sudden idiopathic hearing loss, left ear: Secondary | ICD-10-CM | POA: Insufficient documentation

## 2020-06-03 DIAGNOSIS — R739 Hyperglycemia, unspecified: Secondary | ICD-10-CM | POA: Diagnosis not present

## 2020-06-03 DIAGNOSIS — K219 Gastro-esophageal reflux disease without esophagitis: Secondary | ICD-10-CM

## 2020-06-03 DIAGNOSIS — Z833 Family history of diabetes mellitus: Secondary | ICD-10-CM

## 2020-06-03 DIAGNOSIS — M62838 Other muscle spasm: Secondary | ICD-10-CM

## 2020-06-03 DIAGNOSIS — Z1211 Encounter for screening for malignant neoplasm of colon: Secondary | ICD-10-CM

## 2020-06-03 DIAGNOSIS — M1990 Unspecified osteoarthritis, unspecified site: Secondary | ICD-10-CM

## 2020-06-03 DIAGNOSIS — M8949 Other hypertrophic osteoarthropathy, multiple sites: Secondary | ICD-10-CM

## 2020-06-03 DIAGNOSIS — Z125 Encounter for screening for malignant neoplasm of prostate: Secondary | ICD-10-CM | POA: Diagnosis not present

## 2020-06-03 DIAGNOSIS — E7849 Other hyperlipidemia: Secondary | ICD-10-CM | POA: Diagnosis not present

## 2020-06-03 DIAGNOSIS — M159 Polyosteoarthritis, unspecified: Secondary | ICD-10-CM

## 2020-06-03 HISTORY — DX: Unspecified osteoarthritis, unspecified site: M19.90

## 2020-06-03 HISTORY — DX: Sudden idiopathic hearing loss, left ear: H91.22

## 2020-06-03 LAB — COMPREHENSIVE METABOLIC PANEL
ALT: 17 U/L (ref 0–53)
AST: 17 U/L (ref 0–37)
Albumin: 4.5 g/dL (ref 3.5–5.2)
Alkaline Phosphatase: 67 U/L (ref 39–117)
BUN: 19 mg/dL (ref 6–23)
CO2: 31 mEq/L (ref 19–32)
Calcium: 9.3 mg/dL (ref 8.4–10.5)
Chloride: 104 mEq/L (ref 96–112)
Creatinine, Ser: 0.86 mg/dL (ref 0.40–1.50)
GFR: 88.52 mL/min (ref >60.00)
Glucose, Bld: 87 mg/dL (ref 70–99)
Potassium: 4 mEq/L (ref 3.5–5.1)
Sodium: 138 mEq/L (ref 135–145)
Total Bilirubin: 0.6 mg/dL (ref 0.2–1.2)
Total Protein: 6.5 g/dL (ref 6.0–8.3)

## 2020-06-03 LAB — CBC WITH DIFFERENTIAL/PLATELET
Basophils Absolute: 0 10*3/uL (ref 0.0–0.1)
Basophils Relative: 0.7 % (ref 0.0–3.0)
Eosinophils Absolute: 0.1 10*3/uL (ref 0.0–0.7)
Eosinophils Relative: 1.9 % (ref 0.0–5.0)
HCT: 39.9 % (ref 39.0–52.0)
Hemoglobin: 14 g/dL (ref 13.0–17.0)
Lymphocytes Relative: 27.9 % (ref 12.0–46.0)
Lymphs Abs: 1 10*3/uL (ref 0.7–4.0)
MCHC: 35.2 g/dL (ref 30.0–36.0)
MCV: 87.6 fl (ref 78.0–100.0)
Monocytes Absolute: 0.6 10*3/uL (ref 0.1–1.0)
Monocytes Relative: 15.3 % — ABNORMAL HIGH (ref 3.0–12.0)
Neutro Abs: 2 10*3/uL (ref 1.4–7.7)
Neutrophils Relative %: 54.2 % (ref 43.0–77.0)
Platelets: 182 10*3/uL (ref 150.0–400.0)
RBC: 4.56 Mil/uL (ref 4.22–5.81)
RDW: 12.2 % (ref 11.5–15.5)
WBC: 3.7 10*3/uL — ABNORMAL LOW (ref 4.0–10.5)

## 2020-06-03 LAB — PSA, MEDICARE: PSA: 0.02 ng/ml — ABNORMAL LOW (ref 0.10–4.00)

## 2020-06-03 LAB — TSH: TSH: 1.76 u[IU]/mL (ref 0.35–4.50)

## 2020-06-03 LAB — HEMOGLOBIN A1C: Hgb A1c MFr Bld: 5.5 % (ref 4.6–6.5)

## 2020-06-03 MED ORDER — OMEPRAZOLE 40 MG PO CPDR: DELAYED_RELEASE_CAPSULE | ORAL | 3 refills | Status: DC

## 2020-06-03 MED ORDER — MELOXICAM 15 MG PO TABS: 15.0000 mg | ORAL_TABLET | Freq: Every day | ORAL | 3 refills | Status: DC | PRN

## 2020-06-03 MED ORDER — CYCLOBENZAPRINE HCL 5 MG PO TABS: 5.0000 mg | ORAL_TABLET | Freq: Three times a day (TID) | ORAL | 0 refills | Status: DC | PRN

## 2020-06-03 NOTE — Assessment & Plan Note (Signed)
Chronic Cholesterol well controled Crestor, repatha  Cbc, tsh, cmp, lipids

## 2020-06-03 NOTE — Assessment & Plan Note (Signed)
Chronic Increased taking tylenol or advil Will start meloxicam 15 mg daily prn - he was on this in the past - will not take daily given his history of PUD Can take tylenol for mild pain and mobic for mod-severe pain

## 2020-06-03 NOTE — Assessment & Plan Note (Signed)
Following with ENT - no reason for hearing loss

## 2020-06-03 NOTE — Assessment & Plan Note (Signed)
Chronic, intermittent At surgical site Flexeril as needed continue

## 2020-06-03 NOTE — Assessment & Plan Note (Signed)
Chronic GERD controlled Continue daily medication Omeprazole 40 mg daily

## 2020-06-03 NOTE — Assessment & Plan Note (Signed)
Chronic Check a1c Low sugar / carb diet Stressed regular exercise  

## 2020-06-03 NOTE — Assessment & Plan Note (Signed)
Chronic lipid panel well controlled Continue daily statin, repatha Regular exercise and healthy diet encouraged

## 2020-06-09 DIAGNOSIS — R519 Headache, unspecified: Secondary | ICD-10-CM | POA: Diagnosis not present

## 2020-06-09 DIAGNOSIS — Z79899 Other long term (current) drug therapy: Secondary | ICD-10-CM | POA: Diagnosis not present

## 2020-06-09 DIAGNOSIS — I2510 Atherosclerotic heart disease of native coronary artery without angina pectoris: Secondary | ICD-10-CM | POA: Diagnosis not present

## 2020-06-09 DIAGNOSIS — C07 Malignant neoplasm of parotid gland: Secondary | ICD-10-CM | POA: Diagnosis not present

## 2020-06-09 DIAGNOSIS — K117 Disturbances of salivary secretion: Secondary | ICD-10-CM | POA: Diagnosis not present

## 2020-06-20 DIAGNOSIS — Z9221 Personal history of antineoplastic chemotherapy: Secondary | ICD-10-CM | POA: Diagnosis not present

## 2020-06-20 DIAGNOSIS — H9042 Sensorineural hearing loss, unilateral, left ear, with unrestricted hearing on the contralateral side: Secondary | ICD-10-CM | POA: Diagnosis not present

## 2020-06-20 DIAGNOSIS — Z974 Presence of external hearing-aid: Secondary | ICD-10-CM | POA: Diagnosis not present

## 2020-06-20 DIAGNOSIS — H903 Sensorineural hearing loss, bilateral: Secondary | ICD-10-CM | POA: Diagnosis not present

## 2020-06-20 DIAGNOSIS — H6121 Impacted cerumen, right ear: Secondary | ICD-10-CM | POA: Diagnosis not present

## 2020-06-20 DIAGNOSIS — Z87891 Personal history of nicotine dependence: Secondary | ICD-10-CM | POA: Diagnosis not present

## 2020-06-20 DIAGNOSIS — Z85818 Personal history of malignant neoplasm of other sites of lip, oral cavity, and pharynx: Secondary | ICD-10-CM | POA: Diagnosis not present

## 2020-06-20 DIAGNOSIS — Z8509 Personal history of malignant neoplasm of other digestive organs: Secondary | ICD-10-CM | POA: Diagnosis not present

## 2020-06-28 DIAGNOSIS — C07 Malignant neoplasm of parotid gland: Secondary | ICD-10-CM | POA: Diagnosis not present

## 2020-07-05 ENCOUNTER — Ambulatory Visit: Payer: BC Managed Care – PPO | Admitting: Cardiology

## 2020-07-08 ENCOUNTER — Ambulatory Visit (INDEPENDENT_AMBULATORY_CARE_PROVIDER_SITE_OTHER): Payer: BC Managed Care – PPO | Admitting: Cardiology

## 2020-07-08 ENCOUNTER — Encounter: Payer: Self-pay | Admitting: Cardiology

## 2020-07-08 ENCOUNTER — Other Ambulatory Visit: Payer: Self-pay

## 2020-07-08 VITALS — BP 106/76 | HR 58 | Ht 71.0 in | Wt 178.0 lb

## 2020-07-08 DIAGNOSIS — I2510 Atherosclerotic heart disease of native coronary artery without angina pectoris: Secondary | ICD-10-CM | POA: Diagnosis not present

## 2020-07-08 DIAGNOSIS — E7841 Elevated Lipoprotein(a): Secondary | ICD-10-CM | POA: Diagnosis not present

## 2020-07-08 DIAGNOSIS — E782 Mixed hyperlipidemia: Secondary | ICD-10-CM | POA: Diagnosis not present

## 2020-07-08 DIAGNOSIS — C089 Malignant neoplasm of major salivary gland, unspecified: Secondary | ICD-10-CM

## 2020-07-08 MED ORDER — REPATHA SURECLICK 140 MG/ML ~~LOC~~ SOAJ: 1.0000 | SUBCUTANEOUS | 11 refills | Status: DC

## 2020-07-08 NOTE — Progress Notes (Signed)
Cardiology Office Note:    Date:  07/08/2020   ID:  Gregory Santiago, DOB [birth year], MRN [id number]  PCP:  Binnie Rail, MD  Cardiologist:  Shirlee More, MD    Referring MD: Binnie Rail, MD    ASSESSMENT:    1. Coronary artery calcification seen on CAT scan   2. Mixed hyperlipidemia   3. Elevated lipoprotein(a)   4. 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 He is in a high risk group with coronary artery calcification salivary duct carcinoma and elevated lipids and LP(a) is on a good regimen including high intensity statin PCSK9 inhibitor no side effects labs are excellent continue the same I will check labs on him today plan to see back in the office in 6 months.  He has had an evaluation for ischemia with myocardial perfusion study showing normal function and no ischemia.  This time I do not think he needs to repeat ischemia evaluation. 2. Cancer is stable he is tolerating his treatments and is making good recovery   Next appointment: 6 months   Medication Adjustments/Labs and Tests Ordered: Current medicines are reviewed at length with the patient today.  Concerns regarding medicines are outlined above.  Orders Placed This Encounter  Procedures  . Comprehensive metabolic panel  . Lipid panel  . EKG 12-Lead   Meds ordered this encounter  Medications  . Evolocumab (REPATHA SURECLICK) 628 MG/ML SOAJ    Sig: Inject 1 Dose into the skin every 14 (fourteen) days.    Dispense:  2 pen    Refill:  11    Approved NDCs [address]-01, [address]-02, [address]-01, [phone]    No chief complaint on file.   History of Present Illness:    Gregory Santiago is a 69 y.o. male with a hx of coronary artery calcification hyperlipidemia and very elevated LP(a) level 261 last seen by me 07/15/2019.  Subsequently seen lipid clinic evaluation initiated on PCSK9.  His response is exceptional with an LDL of 26 no side  effects from lipid-lowering treatment no muscle pain or weakness and no chest pain shortness of breath palpitation or syncope.  He has made a good recovery from his cancer surgery and treatment.. Compliance with diet, lifestyle and medications: Yes Past Medical History:  Diagnosis Date  . Allergic rhinitis   . AR (allergic rhinitis) 10/28/2018  . Coronary artery calcification seen on CAT scan 05/22/2019  . Cranial nerve VII palsy   . Duodenitis   . Ear pressure, right 02/27/2019  . Family history of diabetes mellitus in mother 05/21/2019  . Fundic gland polyps of stomach, benign   . GERD (gastroesophageal reflux disease)   . Hemorrhoids   . Hepatitis A   . 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 HLD (hyperlipidemia)   . Knee pain 04/23/2018  . Oropharyngeal dysphagia 06/02/2019  . 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  . 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/02/2019  . Stye 05/22/2019    Past Surgical History:  Procedure Laterality Date  .  arm surgery     left, ligaments and tendon repair, accident  . EYE SURGERY  11/2018   eye lid repair  . EYE SURGERY  07/03/2019   eye lid surgery  . MENISCUS REPAIR Right 2018  . NASAL SEPTUM SURGERY    . SALIVARY GLAND SURGERY Right    Nov 2019    Current Medications: Current Meds  Medication Sig  . cyclobenzaprine (FLEXERIL) 5 MG tablet Take 1 tablet (5 mg total) by mouth 3 (three) times daily as needed for muscle spasms.  . Evolocumab (REPATHA SURECLICK) 671 MG/ML SOAJ Inject 1 Dose into the  skin every 14 (fourteen) days.  . fluticasone (FLONASE) 50 MCG/ACT nasal spray Place 2 sprays into both nostrils daily.  . hydrocortisone (ANUSOL-HC) 2.5 % rectal cream Place 1 application rectally 2 (two) times daily as needed for hemorrhoids or itching. Keep on file - does not need filled now  . meloxicam (MOBIC) 15 MG tablet Take 1 tablet (15 mg total) by mouth daily as needed for pain.  . Multiple Vitamin (MULTIVITAMIN) tablet Take 1 tablet by mouth daily.  Marland Kitchen omeprazole (PRILOSEC) 40 MG capsule TAKE 1 CAPSULE BY MOUTH DAILY  . rosuvastatin (CRESTOR) 20 MG tablet Take 1 tablet (20 mg total) by mouth daily.  . sodium fluoride (FLUORISHIELD) 1.1 % GEL dental gel USE ONCE DAILY AFTER BRUSHING. PLACE IN TRAY FOR 5 MINUTES. DO NOT RINSE OR EAT 30 MINUTES AFTER APPLICATION  . [DISCONTINUED] Evolocumab (REPATHA SURECLICK) 245 MG/ML SOAJ Inject 1 Dose into the skin every 14 (fourteen) days.     Allergies:   Patient has no known allergies.   Social History   Socioeconomic History  . Marital status: Married    Spouse name: Not on file  . Number of children: 3  . Years of education: 1 year of college  . Highest education level: Some college, no degree  Occupational History  . 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 Smoking status: Former Smoker    Packs/day: 0.50    Years: 5.00    Pack years: 2.50    Types: Cigarettes    Quit date: 12/24/1982    Years since quitting: 37.5  . Smokeless tobacco: Never Used  Vaping Use  . Vaping Use: Never used  Substance and Sexual Activity  . Alcohol use: Yes    Comment: social  . Drug use: No  . Sexual activity: Not on file  Other Topics Concern  . Not on file  Social History Narrative   Exercise: not currently running   Lives at home with wife    Right handed   Caffeine: 6 cups daily   Social Determinants of Health   Financial Resource Strain:   . Difficulty of Paying Living Expenses:   Food Insecurity:   .  Worried About Charity fundraiser in the Last Year:   . Arboriculturist in the Last Year:   Transportation Needs:   . Film/video editor (Medical):   Marland Kitchen Lack of Transportation (Non-Medical):   Physical Activity:   . Days of Exercise per Week:   . Minutes of Exercise per Session:   Stress:   . Feeling of Stress :   Social Connections:   . Frequency of Communication with Friends and Family:   . Frequency of Social Gatherings with Friends and Family:   . Attends Religious Services:   . Active Member of Clubs or Organizations:   . Attends Club  or Organization Meetings:   Marland Kitchen Marital Status:      Family History: The patient's family history includes Arthritis in his father, mother, and paternal grandmother; Colon polyps in his mother; Diabetes in his mother; Heart attack in his paternal grandfather; Heart disease in his paternal grandfather; Lymphoma in his father; Parkinson's disease in his mother; Prostate cancer in his father. ROS:   Please see the history of present illness.    All other systems reviewed and are negative.  EKGs/Labs/Other Studies Reviewed:    The following studies were reviewed today:  EKG:  EKG ordered today and personally reviewed.  The ekg ordered today demonstrates sinus rhythm normal EKG  Recent Labs: 06/03/2020: ALT 17; BUN 19; Creatinine, Ser 0.86; Hemoglobin 14.0; Platelets 182.0; Potassium 4.0; Sodium 138; TSH 1.76  Recent Lipid Panel    Component Value Date/Time   CHOL 86 (L) 04/28/2020 0856   TRIG 68 04/28/2020 0856   HDL 43 04/28/2020 0856   CHOLHDL 2.0 04/28/2020 0856   CHOLHDL 5 05/22/2019 0830   VLDL 26.0 05/22/2019 0830   LDLCALC 28 04/28/2020 0856   LDLDIRECT 155.9 01/29/2014 1028    Physical Exam:    VS:  BP 106/76   Pulse (!) 58   Ht 5\' 11"  (1.803 m)   Wt 178 lb (80.7 kg)   SpO2 93%   BMI 24.83 kg/m     Wt Readings from Last 3 Encounters:  07/08/20 178 lb (80.7 kg)  06/03/20 179 lb (81.2 kg)  05/05/20 185 lb 3.2 oz (84 kg)       GEN: No xanthoma or xanthelasma well nourished, well developed in no acute distress HEENT: Normal NECK: No JVD; No carotid bruits LYMPHATICS: No lymphadenopathy CARDIAC: RRR, no murmurs, rubs, gallops RESPIRATORY:  Clear to auscultation without rales, wheezing or rhonchi  ABDOMEN: Soft, non-tender, non-distended MUSCULOSKELETAL:  No edema; No deformity  SKIN: Warm and dry NEUROLOGIC:  Alert and oriented x 3 PSYCHIATRIC:  Normal affect    Signed, Shirlee More, MD  07/08/2020 12:18 PM    [facility]

## 2020-07-08 NOTE — Patient Instructions (Signed)

## 2020-07-09 LAB — COMPREHENSIVE METABOLIC PANEL
ALT: 17 IU/L (ref 0–44)
AST: 19 IU/L (ref 0–40)
Albumin/Globulin Ratio: 2 (ref 1.2–2.2)
Albumin: 4.6 g/dL (ref 3.8–4.8)
Alkaline Phosphatase: 74 IU/L (ref 48–121)
BUN/Creatinine Ratio: 18 (ref 10–24)
BUN: 16 mg/dL (ref 8–27)
Bilirubin Total: 0.3 mg/dL (ref 0.0–1.2)
CO2: 26 mmol/L (ref 20–29)
Calcium: 9.3 mg/dL (ref 8.6–10.2)
Chloride: 105 mmol/L (ref 96–106)
Creatinine, Ser: 0.91 mg/dL (ref 0.76–1.27)
GFR calc Af Amer: 100 mL/min/{1.73_m2} (ref >59)
GFR calc non Af Amer: 87 mL/min/{1.73_m2} (ref >59)
Globulin, Total: 2.3 g/dL (ref 1.5–4.5)
Glucose: 94 mg/dL (ref 65–99)
Potassium: 4.7 mmol/L (ref 3.5–5.2)
Sodium: 140 mmol/L (ref 134–144)
Total Protein: 6.9 g/dL (ref 6.0–8.5)

## 2020-07-09 LAB — LIPID PANEL
Chol/HDL Ratio: 2.3 ratio (ref 0.0–5.0)
Cholesterol, Total: 104 mg/dL (ref 100–199)
HDL: 46 mg/dL (ref >39)
LDL Chol Calc (NIH): 39 mg/dL (ref 0–99)
Triglycerides: 99 mg/dL (ref 0–149)
VLDL Cholesterol Cal: 19 mg/dL (ref 5–40)

## 2020-07-11 ENCOUNTER — Telehealth: Payer: Self-pay

## 2020-07-11 NOTE — Telephone Encounter (Signed)
Spoke with patients wife regarding results and recommendation. ° °She verbalizes understanding and is agreeable to plan of care. Advised patient to call back with any issues or concerns.  °

## 2020-07-31 DIAGNOSIS — Z20828 Contact with and (suspected) exposure to other viral communicable diseases: Secondary | ICD-10-CM | POA: Diagnosis not present

## 2020-09-20 DIAGNOSIS — C07 Malignant neoplasm of parotid gland: Secondary | ICD-10-CM | POA: Diagnosis not present

## 2020-09-27 DIAGNOSIS — Z03818 Encounter for observation for suspected exposure to other biological agents ruled out: Secondary | ICD-10-CM | POA: Diagnosis not present

## 2020-10-03 DIAGNOSIS — C07 Malignant neoplasm of parotid gland: Secondary | ICD-10-CM | POA: Diagnosis not present

## 2020-10-06 DIAGNOSIS — Z79899 Other long term (current) drug therapy: Secondary | ICD-10-CM | POA: Diagnosis not present

## 2020-10-06 DIAGNOSIS — I2510 Atherosclerotic heart disease of native coronary artery without angina pectoris: Secondary | ICD-10-CM | POA: Diagnosis not present

## 2020-10-06 DIAGNOSIS — Z23 Encounter for immunization: Secondary | ICD-10-CM | POA: Diagnosis not present

## 2020-10-06 DIAGNOSIS — R5383 Other fatigue: Secondary | ICD-10-CM | POA: Diagnosis not present

## 2020-10-06 DIAGNOSIS — K117 Disturbances of salivary secretion: Secondary | ICD-10-CM | POA: Diagnosis not present

## 2020-10-06 DIAGNOSIS — C07 Malignant neoplasm of parotid gland: Secondary | ICD-10-CM | POA: Diagnosis not present

## 2020-10-06 DIAGNOSIS — Z87891 Personal history of nicotine dependence: Secondary | ICD-10-CM | POA: Diagnosis not present

## 2020-10-10 DIAGNOSIS — H6121 Impacted cerumen, right ear: Secondary | ICD-10-CM | POA: Diagnosis not present

## 2020-10-10 DIAGNOSIS — Z23 Encounter for immunization: Secondary | ICD-10-CM | POA: Diagnosis not present

## 2020-10-18 ENCOUNTER — Encounter: Payer: Self-pay | Admitting: Gastroenterology

## 2020-10-19 DIAGNOSIS — Z03818 Encounter for observation for suspected exposure to other biological agents ruled out: Secondary | ICD-10-CM | POA: Diagnosis not present

## 2020-11-21 ENCOUNTER — Emergency Department (HOSPITAL_COMMUNITY)
Admission: EM | Admit: 2020-11-21 | Discharge: 2020-11-21 | Disposition: A | Discharge status: Home / Self Care | Attending: Emergency Medicine | Admitting: Emergency Medicine

## 2020-11-21 ENCOUNTER — Other Ambulatory Visit: Payer: Self-pay

## 2020-11-21 ENCOUNTER — Encounter (HOSPITAL_COMMUNITY): Payer: Self-pay

## 2020-11-21 DIAGNOSIS — S0181XA Laceration without foreign body of other part of head, initial encounter: Secondary | ICD-10-CM

## 2020-11-21 DIAGNOSIS — W228XXA Striking against or struck by other objects, initial encounter: Secondary | ICD-10-CM | POA: Diagnosis not present

## 2020-11-21 DIAGNOSIS — Y990 Civilian activity done for income or pay: Secondary | ICD-10-CM | POA: Insufficient documentation

## 2020-11-21 DIAGNOSIS — Z87891 Personal history of nicotine dependence: Secondary | ICD-10-CM | POA: Insufficient documentation

## 2020-11-21 DIAGNOSIS — Z23 Encounter for immunization: Secondary | ICD-10-CM | POA: Diagnosis not present

## 2020-11-21 DIAGNOSIS — S0990XA Unspecified injury of head, initial encounter: Secondary | ICD-10-CM

## 2020-11-21 MED ORDER — TETANUS-DIPHTH-ACELL PERTUSSIS 5-2.5-18.5 LF-MCG/0.5 IM SUSY
0.5000 mL | PREFILLED_SYRINGE | Freq: Once | INTRAMUSCULAR | Status: AC
  Administered 2020-11-21: 0.5 mL via INTRAMUSCULAR
  Filled 2020-11-21: qty 0.5

## 2020-11-21 MED ORDER — LIDOCAINE-EPINEPHRINE (PF) 2 %-1:200000 IJ SOLN
20.0000 mL | Freq: Once | INTRAMUSCULAR | Status: DC
  Filled 2020-11-21: qty 20

## 2020-11-21 MED ORDER — LIDOCAINE-EPINEPHRINE 1 %-1:100000 IJ SOLN
INTRAMUSCULAR | Status: AC
  Administered 2020-11-21: 1 mL
  Filled 2020-11-21: qty 1

## 2020-11-21 NOTE — ED Triage Notes (Signed)
Pt BIB pov d/t hitting his head on a metal "belt guard" that resorted in a 4cm lac to the center of his forehead. This happened around 0944. Upon arrival to ED pt is A/Ox4, verbal - able to make needs known.

## 2020-11-21 NOTE — ED Provider Notes (Signed)
[facility] EMERGENCY DEPARTMENT Provider Note   CSN: [id number] Arrival date & time: 11/21/20  1050     History Head Laceration   Gregory Santiago is a 69 y.o. 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  Patient had to central forehead just PTA.  Denies headache, loss of consciousness or fall to ground.  He denies any syncope, anticoagulation use.  Has right-sided facial droop which is chronic in nature due to prior surgery.  Last tetanus in 2014.  Daughter attempted to clean out wound PTA.  Denies any pain.  Denies visual field changes, paresthesias, weakness, neck pain, neck stiffness, lightheadedness, dizziness, chest pain, shortness breath, abdominal pain prior to fall.  Denies additional aggravating or alleviating factors.  Has been ambulatory since the incident.  Pain currently 1/10.  History obtained from patient, past medical records and family in room.  No interpreter was used.  HPI     Past Medical History:  Diagnosis Date  . Allergic rhinitis   . AR (allergic rhinitis) 10/28/2018  . Coronary artery calcification seen on CAT scan 05/22/2019  . Cranial nerve VII palsy   . Duodenitis   . Ear pressure, right 02/27/2019  . Family history of diabetes mellitus in mother 05/21/2019  . Fundic gland polyps of stomach, benign   . GERD (gastroesophageal reflux disease)   . Hemorrhoids   . Hepatitis A   . 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 HLD (hyperlipidemia)   . Knee pain 04/23/2018  . Oropharyngeal dysphagia 06/02/2019  . 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  . 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/02/2019  . Stye 05/22/2019    Patient Active Problem List   Diagnosis Date Noted  . Sudden left hearing loss 06/03/2020  . Osteoarthritis 06/03/2020  . Muscle spasms of neck 06/03/2020  . Oropharyngeal dysphagia 06/02/2019  . 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/02/2019  . Coronary artery calcification seen on CAT scan 05/22/2019  . Family history of diabetes mellitus in mother 05/21/2019  . 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  . 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 AR (allergic rhinitis) 10/28/2018  . 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 Knee pain 04/23/2018  . GERD (gastroesophageal reflux disease)   . HLD (hyperlipidemia)     Past Surgical History:  Procedure Laterality Date  . arm surgery     left, ligaments and tendon repair, accident  . EYE SURGERY  11/2018   eye lid repair  . EYE SURGERY  07/03/2019   eye lid surgery  . MENISCUS REPAIR Right 2018  . NASAL SEPTUM SURGERY    . SALIVARY GLAND SURGERY Right    Nov 2019       Family History  Problem Relation Age of Onset  . Prostate cancer Father   . Arthritis Father   . Lymphoma Father   .  Colon polyps Mother   . Diabetes Mother   . Parkinson's disease Mother   . Arthritis Mother   . Arthritis Paternal Grandmother   . Heart attack Paternal Grandfather   . Heart disease Paternal Grandfather     Social History   Tobacco Use  . Smoking status: Former Smoker    Packs/day: 0.50    Years: 5.00    Pack years: 2.50    Types: Cigarettes    Quit date: 12/24/1982    Years since quitting: 37.9  . Smokeless  tobacco: Never Used  Vaping Use  . Vaping Use: Never used  Substance Use Topics  . Alcohol use: Yes    Comment: social  . Drug use: No    Home Medications Prior to Admission medications   Medication Sig Start Date End Date Taking? Authorizing Provider  cyclobenzaprine (FLEXERIL) 5 MG tablet Take 1 tablet (5 mg total) by mouth 3 (three) times daily as needed for muscle spasms. 06/03/20   Burns, Claudina Lick, MD  Evolocumab (REPATHA SURECLICK) 295 MG/ML SOAJ Inject 1 Dose into the skin every 14 (fourteen) days. 07/08/20   Richardo Priest, MD  fluticasone (FLONASE) 50 MCG/ACT nasal spray Place 2 sprays into both nostrils daily.    [provider]  hydrocortisone (ANUSOL-HC) 2.5 % rectal cream Place 1 application rectally 2 (two) times daily as needed for hemorrhoids or itching. Keep on file - does not need filled now 03/18/17   Binnie Rail, MD  meloxicam (MOBIC) 15 MG tablet Take 1 tablet (15 mg total) by mouth daily as needed for pain. 06/03/20   Binnie Rail, MD  Multiple Vitamin (MULTIVITAMIN) tablet Take 1 tablet by mouth daily.    [provider]  omeprazole (PRILOSEC) 40 MG capsule TAKE 1 CAPSULE BY MOUTH DAILY 06/03/20   Binnie Rail, MD  rosuvastatin (CRESTOR) 20 MG tablet Take 1 tablet (20 mg total) by mouth daily. 03/29/20   Richardo Priest, MD  sodium fluoride (FLUORISHIELD) 1.1 % GEL dental gel USE ONCE DAILY AFTER BRUSHING. PLACE IN TRAY FOR 5 MINUTES. DO NOT RINSE OR EAT 30 MINUTES AFTER APPLICATION 1/88/41   [provider]    Allergies    Patient has no known allergies.  Review of Systems   Review of Systems  Constitutional: Negative.   HENT: Negative.   Respiratory: Negative.   Cardiovascular: Negative.   Gastrointestinal: Negative.   Genitourinary: Negative.   Musculoskeletal: Negative.   Skin: Positive for wound.  Neurological: Negative.   All other systems reviewed and are negative.   Physical Exam Updated Vital Signs BP 122/89 (BP  Location: Right Arm)   Pulse 72   Temp 97.8 F (36.6 C) (Oral)   Resp 16   SpO2 99%   Physical Exam Vitals and nursing note reviewed.  Constitutional:      General: He is not in acute distress.    Appearance: He is well-developed. He is not ill-appearing, toxic-appearing or diaphoretic.  HENT:     Head: Normocephalic. Laceration present. No raccoon eyes, Battle's sign, right periorbital erythema or left periorbital erythema.     Jaw: There is normal jaw occlusion.      Comments: 4 cm, straight laceration to central forehead ending just prior to hairline no obvious bleeding or drainage.  Chronic right-sided facial droop.    Right Ear: No hemotympanum.     Left Ear: No hemotympanum.     Ears:     Comments: Hearing aids in place  Nose:     Comments: No septal hematoma, no bony tenderness to nasal bridge    Mouth/Throat:     Lips: Pink.     Mouth: Mucous membranes are moist.     Pharynx: Oropharynx is clear. Uvula midline.     Comments: Tongue midline.  Asymmetrical smile which is chronic in nature.  No loose dentition Eyes:     Extraocular Movements: Extraocular movements intact.     Conjunctiva/sclera: Conjunctivae normal.     Pupils: Pupils are equal, round, and reactive to light.     Comments: Unable to fully close right eyelid which is chronic  Neck:     Trachea: Trachea and phonation normal.     Comments: No midline cervical tenderness, crepitus or step-offs.  Full range of motion without difficulty Cardiovascular:     Rate and Rhythm: Normal rate and regular rhythm.     Pulses: Normal pulses.          Radial pulses are 2+ on the right side and 2+ on the left side.     Heart sounds: Normal heart sounds.  Pulmonary:     Effort: Pulmonary effort is normal. No respiratory distress.     Breath sounds: Normal breath sounds and air entry.     Comments: Speaks in full sentences without difficulty Chest:     Comments: No crepitus or step-offs Abdominal:     General: Bowel  sounds are normal. There is no distension.     Palpations: Abdomen is soft.     Tenderness: There is no abdominal tenderness. There is no guarding or rebound.     Comments: Soft, nontender  Musculoskeletal:        General: Normal range of motion.     Cervical back: Full passive range of motion without pain, normal range of motion and neck supple.     Comments: No bony tenderness.  Moves all 4 extremities at difficulty.  Compartment soft.  Skin:    General: Skin is warm and dry.     Capillary Refill: Capillary refill takes less than 2 seconds.     Comments: 4 cm laceration to mid central forehead.  No active bleeding or drainage.  No crepitus, step-offs  Neurological:     Mental Status: He is alert and oriented to person, place, and time.     Cranial Nerves: Facial asymmetry (Chronic, per family) present.     Sensory: Sensation is intact.     Motor: Motor function is intact.     Gait: Gait is intact.     Comments: Equal handgrip bilaterally Intact sensation bilaterally Moderate right-sided facial droop including eyelid, forehead as well as right lower mouth. Moves without difficulty     ED Results / Procedures / Treatments   Labs (all labs ordered are listed, but only abnormal results are displayed) Labs Reviewed - No data to display  EKG None  Radiology No results found.  Procedures .Marland KitchenLaceration Repair  Date/Time: 11/21/2020 1:12 PM Performed by: Nettie Elm, PA-C Authorized by: Nettie Elm, PA-C   Consent:    Consent obtained:  Verbal   Consent given by:  Patient   Risks discussed:  Infection, need for additional repair, pain, poor cosmetic result, poor wound healing, retained foreign body, tendon damage, vascular damage and nerve damage   Alternatives discussed:  No treatment, delayed treatment, observation and referral Universal protocol:    Procedure explained and questions answered to patient or proxy's satisfaction: yes     Relevant documents  present and verified: yes     Test results available and properly labeled: yes     Imaging studies available: yes     Required blood products, implants, devices, and special equipment available: yes     Site/side marked: yes     Immediately prior to procedure, a time out was called: yes     Patient identity confirmed:  Verbally with patient Anesthesia (see MAR for exact dosages):    Anesthesia method:  Local infiltration   Local anesthetic:  Lidocaine 1% WITH epi Laceration details:    Location:  Face   Face location:  Forehead   Length (cm):  4   Depth (mm):  4 Repair type:    Repair type:  Intermediate Pre-procedure details:    Preparation:  Patient was prepped and draped in usual sterile fashion and imaging obtained to evaluate for foreign bodies Exploration:    Hemostasis achieved with:  Direct pressure   Wound exploration: wound explored through full range of motion and entire depth of wound probed and visualized     Wound extent: no foreign bodies/material noted, no muscle damage noted, no tendon damage noted and no underlying fracture noted     Contaminated: yes   Treatment:    Area cleansed with:  Betadine   Amount of cleaning:  Extensive   Irrigation solution:  Sterile saline   Irrigation method:  Pressure wash Skin repair:    Repair method:  Sutures   Suture size:  5-0   Suture material:  Prolene   Suture technique:  Simple interrupted   Number of sutures:  6 Approximation:    Approximation:  Close Post-procedure details:    Dressing:  Open (no dressing)   Patient tolerance of procedure:  Tolerated well, no immediate complications   (including critical care time)  Medications Ordered in ED Medications  Tdap (BOOSTRIX) injection 0.5 mL (0.5 mLs Intramuscular Given 11/21/20 1136)  lidocaine-EPINEPHrine (XYLOCAINE W/EPI) 1 %-1:100000 (with pres) injection (1 mL  Given 11/21/20 1159)    ED Course  I have reviewed the triage vital signs and the nursing  notes.  Pertinent labs & imaging results that were available during my care of the patient were reviewed by me and considered in my medical decision making (see chart for details).  69 year old male presents for evaluation of facial laceration which occurred just PTA.  Was hit in head by blade.  He did not fall to the ground.  No syncope.  No active bleeding or drainage.  He has 4 cm laceration to mid forehead.  Does have chronic right-sided facial droop including eyes and lower mouth which is chronic in nature per family due to prior surgical intervention from salivary gland carcinoma.  Patient states his neurologic exam is at his baseline.  He is amatory at difficulty.  Equal handgrip and sensation.  Tetanus was updated here.  #6 Prolene sutures placed to laceration after thorough irrigation.  Discussed home care.  Discussed with family possible CT scan to ensure no bony abnormality, intracranial abnormality however wife and patient have declined this at this time.  I feel this is reasonable.  Discussed return precautions with patient and family in room.  They are agreeable to this.  The patient has been appropriately medically screened and/or stabilized in the ED. I have low suspicion for any other emergent medical condition which would require further screening, evaluation or treatment in the ED or require inpatient management.  Patient is hemodynamically stable and in no acute distress.  Patient able to ambulate in department prior to ED.  Evaluation does not show acute pathology that would require ongoing or additional emergent interventions while in the emergency department or further inpatient treatment.  I have discussed the diagnosis with the patient and answered all questions.  Pain is been managed while in the emergency department and patient has no further complaints prior to discharge.  Patient is comfortable with plan discussed in room and is stable for discharge at this time.  I have  discussed strict return precautions for returning to the emergency department.  Patient was encouraged to follow-up with PCP/specialist refer to at discharge.  Discussed with attending, Dr. Billy Fischer who agrees above treatment, plan and disposition.    MDM Rules/Calculators/A&P                           Final Clinical Impression(s) / ED Diagnoses Final diagnoses:  Injury of head, initial encounter  Facial laceration, initial encounter    Rx / DC Orders ED Discharge Orders    None       Pietro Bonura A, PA-C 11/21/20 1313    Gareth Morgan, MD 11/25/20 [phone]

## 2020-11-21 NOTE — Discharge Instructions (Signed)
Make sure to let warm soapy water run over your wound  Stitches will need to be removed in about 1 week.  If you have worsening headache, vomiting, weakness, numbness or tingling, passing out please seek reevaluation the emergency department

## 2020-11-21 NOTE — ED Notes (Signed)
EDP at bedside insertion sutures.

## 2020-11-23 DIAGNOSIS — Z03818 Encounter for observation for suspected exposure to other biological agents ruled out: Secondary | ICD-10-CM | POA: Diagnosis not present

## 2020-11-30 DIAGNOSIS — S0191XA Laceration without foreign body of unspecified part of head, initial encounter: Secondary | ICD-10-CM | POA: Insufficient documentation

## 2020-11-30 NOTE — Progress Notes (Deleted)
Subjective:    Patient ID: Gregory Santiago, male    DOB: 02-25-1951, 69 y.o.   MRN: [id number]  HPI The patient is here for an acute visit for suture removal.  11/29 he went to the ED for a central forehead just PTA.  He denies LOC.  He denied  Headaches, vision change, weakness, dizziness/lightheadedness. His pain was 1/10.  He received a tdap.  Laceration was 4 cm.  6 sutures used to repair.  He is here for suture removal.      Medications and allergies reviewed with patient and updated if appropriate.  Patient Active Problem List   Diagnosis Date Noted  . Sudden left hearing loss 06/03/2020  . Osteoarthritis 06/03/2020  . Muscle spasms of neck 06/03/2020  . Oropharyngeal dysphagia 06/02/2019  . 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/02/2019  . Coronary artery calcification seen on CAT scan 05/22/2019  . Family history of diabetes mellitus in mother 05/21/2019  . 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  . 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 AR (allergic rhinitis) 10/28/2018  . 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 Knee pain 04/23/2018  . GERD (gastroesophageal reflux disease)   . HLD (hyperlipidemia)     Current Outpatient Medications on File Prior to Visit  Medication Sig Dispense Refill  . cyclobenzaprine (FLEXERIL) 5 MG tablet Take 1 tablet (5 mg total) by mouth 3 (three) times daily as needed for muscle spasms. 90 tablet 0  . Evolocumab (REPATHA SURECLICK) 283 MG/ML SOAJ Inject 1 Dose into the skin every 14 (fourteen) days. 2 pen 11  . fluticasone (FLONASE) 50 MCG/ACT nasal spray Place 2 sprays into both nostrils daily.    . hydrocortisone (ANUSOL-HC) 2.5 % rectal cream Place 1 application rectally 2 (two) times daily as needed for hemorrhoids or itching. Keep on file - does not need  filled now 30 g 5  . meloxicam (MOBIC) 15 MG tablet Take 1 tablet (15 mg total) by mouth daily as needed for pain. 90 tablet 3  . Multiple Vitamin (MULTIVITAMIN) tablet Take 1 tablet by mouth daily.    Marland Kitchen omeprazole (PRILOSEC) 40 MG capsule TAKE 1 CAPSULE BY MOUTH DAILY 90 capsule 3  . rosuvastatin (CRESTOR) 20 MG tablet Take 1 tablet (20 mg total) by mouth daily. 90 tablet 2  . sodium fluoride (FLUORISHIELD) 1.1 % GEL dental gel USE ONCE DAILY AFTER BRUSHING. PLACE IN TRAY FOR 5 MINUTES. DO NOT RINSE OR EAT 30 MINUTES AFTER APPLICATION     No current facility-administered medications on file prior to visit.    Past Medical History:  Diagnosis Date  . Allergic rhinitis   . AR (allergic rhinitis) 10/28/2018  . Coronary artery calcification seen on CAT scan 05/22/2019  . Cranial nerve VII palsy   . Duodenitis   . Ear pressure, right 02/27/2019  . Family history of diabetes mellitus in mother 05/21/2019  . Fundic gland polyps of stomach, benign   . GERD (gastroesophageal reflux disease)   . Hemorrhoids   . Hepatitis A   . 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 HLD (hyperlipidemia)   . Knee pain 04/23/2018  . Oropharyngeal dysphagia 06/02/2019  . 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  . 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/02/2019  . Stye 05/22/2019    Past Surgical History:  Procedure Laterality Date  . arm surgery     left, ligaments and tendon repair, accident  . EYE SURGERY  11/2018   eye lid repair  . EYE SURGERY  07/03/2019   eye  lid surgery  . MENISCUS REPAIR Right 2018  . NASAL SEPTUM SURGERY    . SALIVARY GLAND SURGERY Right    Nov 2019    Social History   Socioeconomic History  . Marital status: Married    Spouse name: Not on file  . Number of children: 3  . Years of education: 1 year of college  . Highest education level: Some college, no degree  Occupational History  . Occupation: Programmer, systems: [employer]  Tobacco Use  . Smoking status: Former Smoker    Packs/day: 0.50    Years: 5.00    Pack years: 2.50    Types: Cigarettes    Quit date: 12/24/1982    Years since quitting: 37.9  . Smokeless tobacco: Never Used  Vaping Use  . Vaping Use: Never used  Substance and Sexual Activity  . Alcohol use: Yes    Comment: social  . Drug use: No  . Sexual activity: Not on file  Other Topics Concern  . Not on file  Social History Narrative   Exercise: not currently running   Lives at home with wife    Right handed   Caffeine: 6 cups daily   Social Determinants of Health   Financial Resource Strain:   . Difficulty of Paying Living Expenses: Not on file  Food Insecurity:   . Worried About Charity fundraiser in the Last Year: Not on file  . Ran Out of Food in the Last Year: Not on file  Transportation Needs:   . Lack of Transportation (Medical): Not on file  . Lack of Transportation (Non-Medical): Not on file  Physical Activity:   . Days of Exercise per Week: Not on file  . Minutes of Exercise per Session: Not on file  Stress:   . Feeling of Stress : Not on file  Social Connections:   . Frequency of Communication with Friends and Family: Not on file  . Frequency of Social Gatherings with Friends and Family: Not on file  . Attends Religious Services: Not on file  . Active Member of Clubs or Organizations: Not on file  . Attends Archivist Meetings: Not on file  . Marital Status: Not on file    Family History  Problem Relation Age of Onset  . Prostate cancer  Father   . Arthritis Father   . Lymphoma Father   . Colon polyps Mother   . Diabetes Mother   . Parkinson's disease Mother   . Arthritis Mother   . Arthritis Paternal Grandmother   . Heart attack Paternal Grandfather   . Heart disease Paternal Grandfather     Review of Systems     Objective:  There were no vitals filed for this visit. BP Readings from Last 3 Encounters:  11/21/20 122/89  07/08/20 106/76  06/03/20 134/80   Wt Readings from Last 3 Encounters:  07/08/20 178 lb (80.7 kg)  06/03/20 179 lb (81.2 kg)  05/05/20 185 lb 3.2 oz (84 kg)   There is no height or weight on file to calculate BMI.   Physical Exam         Assessment & Plan:    See Problem List for Assessment and Plan of chronic medical problems.    This visit occurred during the SARS-CoV-2 public health emergency.  Safety protocols were in place, including screening questions prior to the visit, additional usage of staff PPE, and extensive cleaning of exam room while observing appropriate contact time as indicated for disinfecting solutions.

## 2020-12-01 ENCOUNTER — Ambulatory Visit: Payer: Self-pay | Admitting: Internal Medicine

## 2020-12-06 DIAGNOSIS — R1312 Dysphagia, oropharyngeal phase: Secondary | ICD-10-CM | POA: Diagnosis not present

## 2020-12-07 ENCOUNTER — Ambulatory Visit (AMBULATORY_SURGERY_CENTER): Payer: Self-pay

## 2020-12-07 ENCOUNTER — Other Ambulatory Visit: Payer: Self-pay

## 2020-12-07 VITALS — Ht 71.0 in | Wt 180.0 lb

## 2020-12-07 DIAGNOSIS — Z1211 Encounter for screening for malignant neoplasm of colon: Secondary | ICD-10-CM

## 2020-12-07 NOTE — Progress Notes (Signed)
No egg or soy allergy known to patient  No issues with past sedation with any surgeries or procedures No intubation problems in the past -from past surgeries No FH of Malignant Hyperthermia No diet pills per patient No home 02 use per patient  No blood thinners per patient  Pt denies issues with constipation  No A fib or A flutter  EMMI video to pt or via [place] 19 guidelines implemented in PV today with Pt and RN  Pt is fully vaccinated  for Covid + booster Due to the COVID-19 pandemic we are asking patients to follow certain guidelines.  Pt aware of COVID protocols and [facility] guidelines

## 2020-12-21 ENCOUNTER — Ambulatory Visit (AMBULATORY_SURGERY_CENTER): Payer: BC Managed Care – PPO | Admitting: Gastroenterology

## 2020-12-21 ENCOUNTER — Other Ambulatory Visit: Payer: Self-pay

## 2020-12-21 ENCOUNTER — Encounter: Payer: Self-pay | Admitting: Gastroenterology

## 2020-12-21 VITALS — BP 112/78 | HR 57 | Temp 98.9°F | Resp 20 | Ht 71.0 in | Wt 180.0 lb

## 2020-12-21 DIAGNOSIS — D122 Benign neoplasm of ascending colon: Secondary | ICD-10-CM

## 2020-12-21 DIAGNOSIS — D123 Benign neoplasm of transverse colon: Secondary | ICD-10-CM

## 2020-12-21 DIAGNOSIS — Z1211 Encounter for screening for malignant neoplasm of colon: Secondary | ICD-10-CM

## 2020-12-21 MED ORDER — SODIUM CHLORIDE 0.9 % IV SOLN: 500.0000 mL | Freq: Once | INTRAVENOUS | Status: DC

## 2020-12-21 NOTE — Patient Instructions (Signed)
Handouts provided:  Polyps  YOU HAD AN ENDOSCOPIC PROCEDURE TODAY AT THE [facility]:   Refer to the procedure report that was given to you for any specific questions about what was found during the examination.  If the procedure report does not answer your questions, please call your gastroenterologist to clarify.  If you requested that your care partner not be given the details of your procedure findings, then the procedure report has been included in a sealed envelope for you to review at your convenience later.  YOU SHOULD EXPECT: Some feelings of bloating in the abdomen. Passage of more gas than usual.  Walking can help get rid of the air that was put into your GI tract during the procedure and reduce the bloating. If you had a lower endoscopy (such as a colonoscopy or flexible sigmoidoscopy) you may notice spotting of blood in your stool or on the toilet paper. If you underwent a bowel prep for your procedure, you may not have a normal bowel movement for a few days.  Please Note:  You might notice some irritation and congestion in your nose or some drainage.  This is from the oxygen used during your procedure.  There is no need for concern and it should clear up in a day or so.  SYMPTOMS TO REPORT IMMEDIATELY:   Following lower endoscopy (colonoscopy or flexible sigmoidoscopy):  Excessive amounts of blood in the stool  Significant tenderness or worsening of abdominal pains  Swelling of the abdomen that is new, acute  Fever of 100F or higher  For urgent or emergent issues, a gastroenterologist can be reached at any hour by calling [phone]. Do not use MyChart messaging for urgent concerns.    DIET:  We do recommend a small meal at first, but then you may proceed to your regular diet.  Drink plenty of fluids but you should avoid alcoholic beverages for 24 hours.  ACTIVITY:  You should plan to take it easy for the rest of today and you should NOT DRIVE or use heavy  machinery until tomorrow (because of the sedation medicines used during the test).    FOLLOW UP: Our staff will call the number listed on your records 48-72 hours following your procedure to check on you and address any questions or concerns that you may have regarding the information given to you following your procedure. If we do not reach you, we will leave a message.  We will attempt to reach you two times.  During this call, we will ask if you have developed any symptoms of COVID 19. If you develop any symptoms (ie: fever, flu-like symptoms, shortness of breath, cough etc.) before then, please call [phone].  If you test positive for Covid 19 in the 2 weeks post procedure, please call and report this information to us.    If any biopsies were taken you will be contacted by phone or by letter within the next 1-3 weeks.  Please call us at [phone] if you have not heard about the biopsies in 3 weeks.    SIGNATURES/CONFIDENTIALITY: You and/or your care partner have signed paperwork which will be entered into your electronic medical record.  These signatures attest to the fact that that the information above on your After Visit Summary has been reviewed and is understood.  Full responsibility of the confidentiality of this discharge information lies with you and/or your care-partner.  

## 2020-12-21 NOTE — Progress Notes (Signed)
A and O x3. Report to RN. Tolerated MAC anesthesia well.

## 2020-12-21 NOTE — Progress Notes (Signed)
Pt's states no medical or surgical changes since previsit or office visit. 

## 2020-12-21 NOTE — Progress Notes (Signed)
A.G. vital signs. 

## 2020-12-21 NOTE — Progress Notes (Signed)
Called to room to assist during endoscopic procedure.  Patient ID and intended procedure confirmed with present staff. Received instructions for my participation in the procedure from the performing physician.  

## 2020-12-21 NOTE — Op Note (Signed)
[facility] Patient Name: Gregory Santiago Procedure Date: 12/21/2020 10:46 AM MRN: [id number] Endoscopist: Milus Banister , MD Age: 69 Referring MD:  Date of Birth: [birth year] Gender: Male Account #: [id number] Procedure:                Colonoscopy Indications:              Screening for colorectal malignant neoplasm Medicines:                Monitored Anesthesia Care Procedure:                Pre-Anesthesia Assessment:                           - Prior to the procedure, a History and Physical                            was performed, and patient medications and                            allergies were reviewed. The patient's tolerance of                            previous anesthesia was also reviewed. The risks                            and benefits of the procedure and the sedation                            options and risks were discussed with the patient.                            All questions were answered, and informed consent                            was obtained. Prior Anticoagulants: The patient has                            taken no previous anticoagulant or antiplatelet                            agents. ASA Grade Assessment: II - A patient with                            mild systemic disease. After reviewing the risks                            and benefits, the patient was deemed in                            satisfactory condition to undergo the procedure.                           After obtaining informed consent, the colonoscope  was passed under direct vision. Throughout the                            procedure, the patient's blood pressure, pulse, and                            oxygen saturations were monitored continuously. The                            Olympus CF-HQ190L ([id number]) Colonoscope was                            introduced through the anus and advanced to the the                            cecum, identified by  appendiceal orifice and                            ileocecal valve. The colonoscopy was performed                            without difficulty. The patient tolerated the                            procedure well. The quality of the bowel                            preparation was good. The ileocecal valve,                            appendiceal orifice, and rectum were photographed. Scope In: 10:50:42 AM Scope Out: 11:06:43 AM Scope Withdrawal Time: 0 hours 10 minutes 52 seconds  Total Procedure Duration: 0 hours 16 minutes 1 second  Findings:                 Two sessile polyps were found in the transverse                            colon and ascending colon. The polyps were 1 to 3                            mm in size. These polyps were removed with a cold                            snare. Resection and retrieval were complete.                           The exam was otherwise without abnormality on                            direct and retroflexion views. Complications:            No immediate complications. Estimated blood loss:  None. Estimated Blood Loss:     Estimated blood loss: none. Impression:               - Two 1 to 3 mm polyps in the transverse colon and                            in the ascending colon, removed with a cold snare.                            Resected and retrieved.                           - The examination was otherwise normal on direct                            and retroflexion views. Recommendation:           - Patient has a contact number available for                            emergencies. The signs and symptoms of potential                            delayed complications were discussed with the                            patient. Return to normal activities tomorrow.                            Written discharge instructions were provided to the                            patient.                           - Resume previous  diet.                           - Continue present medications.                           - Await pathology results. Rachael Fee, MD 12/21/2020 11:09:13 AM This report has been signed electronically.

## 2020-12-26 ENCOUNTER — Telehealth: Payer: Self-pay | Admitting: *Deleted

## 2020-12-26 DIAGNOSIS — Z03818 Encounter for observation for suspected exposure to other biological agents ruled out: Secondary | ICD-10-CM | POA: Diagnosis not present

## 2020-12-26 NOTE — Telephone Encounter (Signed)
  Follow up Call-  Call back number 12/21/2020  Post procedure Call Back phone  # [phone]  Permission to leave phone message Yes  Some recent data might be hidden    Spoke with wife Patient questions:  Do you have a fever, pain , or abdominal swelling? No. Pain Score  0 *  Have you tolerated food without any problems? Yes.    Have you been able to return to your normal activities? Yes.    Do you have any questions about your discharge instructions: Diet   No. Medications  No. Follow up visit  No.  Do you have questions or concerns about your Care? No.  Actions: * If pain score is 4 or above: No action needed, pain <4.  1. Have you developed a fever since your procedure? no  2.   Have you had an respiratory symptoms (SOB or cough) since your procedure? no  3.   Have you tested positive for COVID 19 since your procedure no  4.   Have you had any family members/close contacts diagnosed with the COVID 19 since your procedure?  no   If yes to any of these questions please route to Laverna Peace, RN and Karlton Lemon, RN

## 2020-12-27 NOTE — Progress Notes (Signed)
Subjective:    Patient ID: Gregory Santiago, male    DOB: 24-Nov-1951, 70 y.o.   MRN: [id number]  HPI The patient is here for an acute visit.  Left lower back and hip pain - he has pain down to his shin.  t started just before christmas.  If he stands more than 2-3 minutes it causes pain.  He is active at work and that makes it worse.   Meloxicam, advil, Tylenol and heat help.    He denies N/T.  He occasionally has b/l leg weakness that is mild.  He has some muscle tightness across his lower back - flexeril as needed as helped. He denies changes in bowel/bladder since this started.     He has some urinary urgency.  His is not normal.    Medications and allergies reviewed with patient and updated if appropriate.  Patient Active Problem List   Diagnosis Date Noted  . Laceration of head 11/30/2020  . Sudden left hearing loss 06/03/2020  . Osteoarthritis 06/03/2020  . Muscle spasms of neck 06/03/2020  . Oropharyngeal dysphagia 06/02/2019  . 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/02/2019  . Coronary artery calcification seen on CAT scan 05/22/2019  . Family history of diabetes mellitus in mother 05/21/2019  . 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  . 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 AR (allergic rhinitis) 10/28/2018  . 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 Knee pain 04/23/2018  . GERD (gastroesophageal reflux disease)   . HLD (hyperlipidemia)     Current Outpatient Medications on File Prior to Visit  Medication Sig Dispense Refill  . cyclobenzaprine (FLEXERIL) 5 MG tablet Take 1 tablet (5 mg total) by mouth 3 (three) times daily as needed for muscle spasms. 90 tablet 0  . Evolocumab (REPATHA SURECLICK) XX123456 MG/ML SOAJ Inject 1 Dose into the skin every 14 (fourteen) days. 2 pen 11  .  fluticasone (FLONASE) 50 MCG/ACT nasal spray Place 2 sprays into both nostrils at bedtime.    . hydrocortisone (ANUSOL-HC) 2.5 % rectal cream Place 1 application rectally 2 (two) times daily as needed for hemorrhoids or itching. Keep on file - does not need filled now 30 g 5  . meloxicam (MOBIC) 15 MG tablet Take 1 tablet (15 mg total) by mouth daily as needed for pain. 90 tablet 3  . Multiple Vitamin (MULTIVITAMIN) tablet Take 1 tablet by mouth daily.    Marland Kitchen omeprazole (PRILOSEC) 40 MG capsule TAKE 1 CAPSULE BY MOUTH DAILY 90 capsule 3  . rosuvastatin (CRESTOR) 20 MG tablet Take 1 tablet (20 mg total) by mouth daily. 90 tablet 2  . sodium fluoride (FLUORISHIELD) 1.1 % GEL dental gel USE ONCE DAILY AFTER BRUSHING. PLACE IN TRAY FOR 5 MINUTES. DO NOT RINSE OR EAT 30 MINUTES AFTER APPLICATION     No current facility-administered medications on file prior to visit.    Past Medical History:  Diagnosis Date  . Allergic rhinitis   . Allergy    seasonal allergies  . AR (allergic rhinitis) 10/28/2018  . Arthritis    generalized  . Blood transfusion without reported diagnosis 1996  . Coronary artery calcification seen on CAT scan 05/22/2019  . Cranial nerve VII palsy   . Duodenitis   . Ear pressure, right 02/27/2019  . Family history of diabetes mellitus in mother 05/21/2019  .  Fundic gland polyps of stomach, benign   . GERD (gastroesophageal reflux disease)    on meds  . Hemorrhoids   . Hepatitis A    as a child  . 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 HLD (hyperlipidemia)    on meds  . Knee pain 04/23/2018  . Oropharyngeal dysphagia 06/02/2019  . 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  . 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/02/2019  . Stye 05/22/2019    Past Surgical History:  Procedure Laterality Date  . arm surgery Left 1996   left forearm, ligaments, nerve and tendon repair, from accident  . CHEST SURGERY  1996   from accident-wound closure  . EYE SURGERY  11/2018   eye lid repair x 3   . HAND SURGERY Right 1996   middle, ring, and little finger filayed open in accident  . MENISCUS REPAIR Right 2018  . NASAL SEPTUM SURGERY  1979  . SALIVARY GLAND SURGERY Right    Nov 2019  . WISDOM TOOTH EXTRACTION      Social History   Socioeconomic History  . Marital status: Married    Spouse name: Not on file  . Number of children: 3  . Years of education: 1 year of college  . Highest education level: Some college, no degree  Occupational History  . Occupation: Architect: [employer]  Tobacco Use  . Smoking status: Former Smoker    Packs/day: 0.50    Years: 5.00    Pack years: 2.50    Types: Cigarettes    Quit date: 12/24/1982    Years since quitting: 38.0  . Smokeless tobacco: Never Used  Vaping Use  . Vaping Use: Never used  Substance and Sexual Activity  . Alcohol use: Yes    Comment: 1 x per month  . Drug use: No  . Sexual activity: Not on file  Other Topics Concern  . Not on file  Social History Narrative   Exercise: not currently running   Lives at home with wife    Right handed   Caffeine: 6 cups daily   Social Determinants of Health   Financial Resource Strain: Not on file  Food Insecurity: Not on file  Transportation Needs: Not on file  Physical Activity: Not on file  Stress: Not on file  Social Connections: Not on file    Family History  Problem Relation Age of Onset  . Prostate cancer Father 51  . Arthritis Father   . Lymphoma Father 96  . Colon polyps Mother 55   . Diabetes Mother   . Parkinson's disease Mother   . Arthritis Mother   . Arthritis Paternal Grandmother   . Heart attack Paternal Grandfather   . Heart disease Paternal Grandfather   . Colon cancer Neg Hx   . Esophageal cancer Neg Hx   . Stomach cancer Neg Hx   . Rectal cancer Neg Hx     Review of Systems Per HPI    Objective:   Vitals:   12/28/20 0744  BP: 110/82  Pulse: 71  Temp: 98.1  F (36.7 C)  SpO2: 96%   BP Readings from Last 3 Encounters:  12/28/20 110/82  12/21/20 112/78  11/21/20 122/89   Wt Readings from Last 3 Encounters:  12/28/20 185 lb (83.9 kg)  12/21/20 180 lb (81.6 kg)  12/07/20 180 lb (81.6 kg)   Body mass index is 25.8 kg/m.   Physical Exam Constitutional:      General: He is not in acute distress.    Appearance: Normal appearance. He is not ill-appearing.  HENT:     Head: Normocephalic and atraumatic.  Musculoskeletal:     Right lower leg: No edema.     Left lower leg: No edema.     Comments: No l-spine tenderness or L SI joint tenderness, positive tenderness L lateral hip  Skin:    General: Skin is warm and dry.  Neurological:     Mental Status: He is alert.     Sensory: No sensory deficit.     Motor: No weakness.     Gait: Gait normal.     Comments: Neg straight leg raise            Assessment & Plan:    See Problem List for Assessment and Plan of chronic medical problems.    This visit occurred during the SARS-CoV-2 public health emergency.  Safety protocols were in place, including screening questions prior to the visit, additional usage of staff PPE, and extensive cleaning of exam room while observing appropriate contact time as indicated for disinfecting solutions.

## 2020-12-28 ENCOUNTER — Ambulatory Visit (INDEPENDENT_AMBULATORY_CARE_PROVIDER_SITE_OTHER): Payer: BC Managed Care – PPO

## 2020-12-28 ENCOUNTER — Other Ambulatory Visit: Payer: Self-pay

## 2020-12-28 ENCOUNTER — Encounter: Payer: Self-pay | Admitting: Internal Medicine

## 2020-12-28 ENCOUNTER — Ambulatory Visit (INDEPENDENT_AMBULATORY_CARE_PROVIDER_SITE_OTHER): Payer: BC Managed Care – PPO | Admitting: Internal Medicine

## 2020-12-28 DIAGNOSIS — M5416 Radiculopathy, lumbar region: Secondary | ICD-10-CM

## 2020-12-28 DIAGNOSIS — M545 Low back pain, unspecified: Secondary | ICD-10-CM | POA: Diagnosis not present

## 2020-12-28 HISTORY — DX: Radiculopathy, lumbar region: M54.16

## 2020-12-28 MED ORDER — GABAPENTIN 100 MG PO CAPS: 100.0000 mg | ORAL_CAPSULE | Freq: Every day | ORAL | 3 refills | Status: DC

## 2020-12-28 NOTE — Patient Instructions (Addendum)
  Have an xray today downstairs.     Medications changes include :  Neurontin 100-200 mg at bedtime.   Continue the meloxicam.  Continue taking tylenol as needed and flexeril as needed.   Your prescription(s) have been submitted to your pharmacy. Please take as directed and contact our office if you believe you are having problem(s) with the medication(s).   A referral was ordered for PT at [facility] on [address]       Someone from their office will call you to schedule an appointment.

## 2020-12-28 NOTE — Assessment & Plan Note (Signed)
Acute Left lower back, hip pain and left lower leg pain Xray today of lumbar spine Referred to PT Stat gabapentin 100-200 mg HS Continue meloxicam 15 mg daily  Tylenol prn, flexeril prn Call if no improvement

## 2020-12-29 DIAGNOSIS — J3489 Other specified disorders of nose and nasal sinuses: Secondary | ICD-10-CM | POA: Diagnosis not present

## 2020-12-29 DIAGNOSIS — H02152 Paralytic ectropion of right lower eyelid: Secondary | ICD-10-CM | POA: Diagnosis not present

## 2020-12-30 ENCOUNTER — Encounter: Payer: Self-pay | Admitting: Gastroenterology

## 2021-01-02 ENCOUNTER — Other Ambulatory Visit: Payer: Self-pay

## 2021-01-02 ENCOUNTER — Ambulatory Visit: Payer: BC Managed Care – PPO | Attending: Internal Medicine

## 2021-01-02 DIAGNOSIS — H02202 Unspecified lagophthalmos right lower eyelid: Secondary | ICD-10-CM | POA: Diagnosis not present

## 2021-01-02 DIAGNOSIS — H2513 Age-related nuclear cataract, bilateral: Secondary | ICD-10-CM | POA: Diagnosis not present

## 2021-01-02 DIAGNOSIS — H02201 Unspecified lagophthalmos right upper eyelid: Secondary | ICD-10-CM | POA: Diagnosis not present

## 2021-01-02 DIAGNOSIS — M62830 Muscle spasm of back: Secondary | ICD-10-CM | POA: Insufficient documentation

## 2021-01-02 DIAGNOSIS — M5442 Lumbago with sciatica, left side: Secondary | ICD-10-CM | POA: Insufficient documentation

## 2021-01-02 DIAGNOSIS — G8929 Other chronic pain: Secondary | ICD-10-CM | POA: Insufficient documentation

## 2021-01-02 DIAGNOSIS — M6281 Muscle weakness (generalized): Secondary | ICD-10-CM | POA: Diagnosis not present

## 2021-01-02 DIAGNOSIS — H16211 Exposure keratoconjunctivitis, right eye: Secondary | ICD-10-CM | POA: Diagnosis not present

## 2021-01-02 NOTE — Therapy (Signed)
[place], [place], [address] Phone: [phone]   Fax:  [phone]  Physical Therapy Evaluation  Patient Details  Name: Gregory Santiago MRN: [id number] Date of Birth: 1951-02-02 Referring Provider (PT): Binnie Rail, MD   Encounter Date: 01/02/2021   PT End of Session - 01/02/21 1323    Visit Number 1    Number of Visits 9    Date for PT Re-Evaluation 03/04/21    Authorization Type [employer] - FOTO visit 6 and visit 10    PT Start Time 1000    PT Stop Time 1043    PT Time Calculation (min) 43 min    Activity Tolerance Patient tolerated treatment well    Behavior During Therapy [facility] for tasks assessed/performed           Past Medical History:  Diagnosis Date  . Allergic rhinitis   . Allergy    seasonal allergies  . AR (allergic rhinitis) 10/28/2018  . Arthritis    generalized  . Blood transfusion without reported diagnosis 1996  . Coronary artery calcification seen on CAT scan 05/22/2019  . Cranial nerve VII palsy   . Duodenitis   . Ear pressure, right 02/27/2019  . Family history of diabetes mellitus in mother 05/21/2019  . Fundic gland polyps of stomach, benign   . GERD (gastroesophageal reflux disease)    on meds  . Hemorrhoids   . Hepatitis A    as a child  . 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 HLD (hyperlipidemia)    on meds  . Knee pain 04/23/2018  . Oropharyngeal dysphagia 06/02/2019  . 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  . 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 Sensorineural hearing  loss (SNHL) of both ears 06/02/2019  . Stye 05/22/2019    Past Surgical History:  Procedure Laterality Date  . arm surgery Left 1996   left forearm, ligaments, nerve and tendon repair, from accident  . CHEST SURGERY  1996   from accident-wound closure  . EYE SURGERY  11/2018   eye lid repair x 3   . HAND SURGERY Right 1996   middle, ring, and little finger filayed open in accident  . MENISCUS REPAIR Right 2018  . NASAL SEPTUM SURGERY  1979  . SALIVARY GLAND SURGERY Right    Nov 2019  . WISDOM TOOTH EXTRACTION      There were no vitals filed for this visit.    Subjective Assessment - 01/02/21 1004    Subjective "Basically it radiates around my left buttocks down to my shin. It gets aggravated by standing. I get relief if I lay down and put a heating pad and do my stretches. It's been going on several months. I had something similar to this 8-9 years ago then ended up going to the chiropractor."    Pertinent History See extensive PMH above    Limitations Standing;Sitting    How long can you sit comfortably? Usually no issues but pain does not worsen/ease with sitting if it is already present    How long can you stand comfortably? "Probably 3-4 minutes then it starts  giving me a problem"    How long can you walk comfortably? "I never really paid attention to it. If it's bothering me, it doesn't get better but doesn't get worse"    Diagnostic tests 12/28/2020 Lumbar spine: Straightening of the expected lumbar lordosis, nonspecific though could be seen in the setting of muscle spasm. Severe DDD of L4-L5. Mild-to-moderate DDD within the remainder of the lumbar spine.    Patient Stated Goals I want to work full time comfortably without this pain    Currently in Pain? Yes    Pain Score 1    "very mild"   Pain Location Hip    Pain Orientation Left;Posterior    Pain Descriptors / Indicators Tightness    Pain Type Chronic pain    Pain Radiating Towards Typically to anterior L shin. "If I  misposition my foot, I get a cramp in the front of my ankle joint." Currently mild in L posterior hip/gluteal region and low back    Pain Onset More than a month ago    Pain Frequency Intermittent    Aggravating Factors  Sleeping on L side, standing    Pain Relieving Factors Medication, stretches, heating pad    Effect of Pain on Daily Activities Discomfort and decreased tolerance in standing              [facility] PT Assessment - 01/02/21 0001      Assessment   Medical Diagnosis Left lumbar radiculopathy (M54.16)    Referring Provider (PT) Binnie Rail, MD    Onset Date/Surgical Date --   several months   Hand Dominance Right    Next MD Visit Nothing scheduled    Prior Therapy Yes for R knee ~5 years ago      Precautions   Precautions None      Restrictions   Weight Bearing Restrictions No      Balance Screen   Has the patient fallen in the past 6 months Yes    How many times? 1 fall at work a couple months    Has the patient had a decrease in activity level because of a fear of falling?  No    Is the patient reluctant to leave their home because of a fear of falling?  No      Home Ecologist residence      Prior Function   Level of Independence Independent    Vocation Full time employment    Haematologist    Leisure Work, Marine scientist, travelling      Associate Professor   Overall Cognitive Status Within Functional Limits for tasks assessed      Observation/Other Assessments   Focus on Therapeutic Outcomes (FOTO)  63% functional status; predicted 70% function      ROM / Strength   AROM / PROM / Strength AROM;Strength      AROM   Overall AROM Comments L trunk SB aggravates. All other lumbar and hip AROM WFL with no pain      Strength   Strength Assessment Site Hip;Knee;Ankle    Right/Left Hip Right;Left    Right Hip Flexion 5/5    Right Hip Extension 5/5    Right Hip ABduction 4+/5    Right Hip ADduction 4+/5    Left  Hip Flexion 5/5    Left Hip Extension 5/5    Left Hip ABduction 5/5    Left Hip ADduction 5/5    Right/Left Knee Right;Left  Right Knee Flexion 5/5    Right Knee Extension 5/5    Left Knee Flexion 5/5    Left Knee Extension 5/5    Right/Left Ankle Right;Left    Right Ankle Dorsiflexion 5/5    Right Ankle Plantar Flexion 5/5   modified test in sitting   Left Ankle Dorsiflexion 5/5    Left Ankle Plantar Flexion 5/5   modified test in sitting     Palpation   Palpation comment TTP along L piriformis, glute med, glute max, and slightly along QL. Minimal TTP along L PSIS.      Special Tests   Other special tests (-) hip scour, FABER, FADDIR. Long sit test revealed anterior rotation of L innominate from supine to longsitting that was no longer apparent following MET (L hip EXT, R hip FL)      High Level Balance   High Level Balance Comments Patient able to perform tandem standing and standing with narrow BOS EO and EC > 10 sec with decreased stability but no LOB. Pt uses stepping strategy after ~3 seconds during SLS on each LE.                      Objective measurements completed on examination: See above findings.       [facility] PT Treatment/Exercise - 01/02/21 0001      Exercises   Exercises Lumbar;Knee/Hip      Lumbar Exercises: Stretches   Lower Trunk Rotation 5 reps;10 seconds    Piriformis Stretch Left;30 seconds                  PT Education - 01/02/21 1321    Education Details HEP, FOTO and potential progress, anatomy of condition and sciatic nerve, innominate rotation with use of skeleton model to demonstrate rotation and MET, POC.    Person(s) Educated Patient    Methods Explanation;Demonstration;Tactile cues;Verbal cues    Comprehension Verbalized understanding;Returned demonstration;Verbal cues required            PT Short Term Goals - 01/02/21 1324      PT SHORT TERM GOAL #1   Title Patient will be independent with initial HEP.     Baseline Patient provided initial HEP during evaluation 01/02/2021.    Time 4    Period Weeks    Status New    Target Date 01/30/21      PT SHORT TERM GOAL #2   Title Patient will be able to perform L SB without pain in L hip and/or L low back.    Baseline Pt complains of pain in L hip and low back with L trunk/lumbar SB.    Time 4    Period Weeks    Status New    Target Date 01/30/21      PT SHORT TERM GOAL #3   Title Patient will be able to perform SLS on each leg > 10 seconds without loss of balance.    Baseline Pt with LOB and required stepping strategy after ~3 seconds on each leg to maintain balance.    Time 4    Period Weeks    Status New    Target Date 01/30/21      PT SHORT TERM GOAL #4   Title Pt will present with no significant innominate rotation upon long sit re-assessment.    Baseline anterior rotation of L innominate from supine to longsitting that was no longer apparent following MET    Time 4  Period Weeks    Status New    Target Date 01/30/21             PT Long Term Goals - 01/02/21 1327      PT LONG TERM GOAL #1   Title Patient will be independent with advanced HEP.    Baseline Patient provided initial HEP during evaluation 01/02/2021.    Time 8    Period Weeks    Status New    Target Date 02/27/21      PT LONG TERM GOAL #2   Title Patient's FOTO score will improve from 65% functional status to 70% function to demonstrate improved perceived ability.    Baseline 65% function; predicted 70% function    Time 8    Period Weeks    Status New    Target Date 02/27/21      PT LONG TERM GOAL #3   Title Patient will report </= 3/10 pain with no radicular symptoms with standing throughout workweek (at least 5 consecutive days).    Baseline Patient reports having increased pain after 3-4 minutes of static standing.    Time 8    Period Weeks    Status New    Target Date 02/27/21      PT LONG TERM GOAL #4   Title Patient will be able to ascend 10  steps forwards and descend 10 steps backwards with no significant increase in pain to mimic ability to navigate ladder (alternating LE each time pt ascends/descends).    Baseline Pt reports increased pain and some difficulty navigating ladder at work occasionally    Time 8    Period Weeks    Status New    Target Date 02/27/21                  Plan - 01/02/21 1308    Clinical Impression Statement Patient is a 70 year old male who presents to OPPT with complaints of pain in L low back and gluteal region with radicular symptoms to L anterior shin and occasional cramping in along anterior aspect of talocrural joint. He presents with myofascial trigger points and tightness along L gluteal and lumbar musculature and aggravation of symptoms with L trunk sidebending during assessment. He did not have aggravation of symptoms with lumbar FL vs EXT. Imaging of lumbar spine 12/28/2020 revealed decreased lumbar lordosis, severe DDD of L4-L5, and mild-to-moderate DDD within the remainder of the lumbar spine. Pt should benefit from skilled PT intervention for centralization of symptoms, pain reduction/management, and core/LE stretching/strengthening for improved tolerance to standing to allow pt to better perform work and daily tasks.    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 Assess light touch sensation. Assess response to HEP and update/progress as indicated. LLE sciatic nerve glides, L hip FL stretch, manual techniques (joint mobs - SP/facet, STM, myofascial release), consider  TPDN if indicated. Reassess long sit test if indicated. Tennis ball for self myofascial release    PT Home Exercise Plan [id number] - piriformis stretch, LTR, MET (L hip EXT, R hip FL) using broom    Consulted and Agree with Plan of Care Patient           Patient will benefit from skilled therapeutic intervention in order to improve the following deficits and impairments:  Decreased activity tolerance,Decreased balance,Decreased mobility,Decreased strength,Decreased endurance,Decreased range of motion,Increased muscle spasms,Pain  Visit Diagnosis: Chronic left-sided low back pain with left-sided sciatica  Muscle spasm of back  Muscle weakness (generalized)     Problem List Patient Active Problem List   Diagnosis Date Noted  . Left lumbar radiculopathy 12/28/2020  . Sudden left hearing loss 06/03/2020  . Osteoarthritis 06/03/2020  . Muscle spasms of neck 06/03/2020  . Oropharyngeal dysphagia 06/02/2019  . 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/02/2019  . Coronary artery calcification seen on CAT scan 05/22/2019  . Family history of diabetes mellitus in mother 05/21/2019  . 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  . 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 AR (allergic rhinitis) 10/28/2018  . 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 Knee pain 04/23/2018  . GERD (gastroesophageal reflux disease)   . HLD (hyperlipidemia)     Haydee Monica, PT, DPT 01/02/21 1:49 PM  [facility] [address] [place], [place], [address] Phone: [phone]   Fax:  [phone]  Name: Enio Hornback MRN: [id number] Date of Birth: [birth year]

## 2021-01-13 ENCOUNTER — Ambulatory Visit: Payer: BC Managed Care – PPO

## 2021-01-13 ENCOUNTER — Other Ambulatory Visit: Payer: Self-pay

## 2021-01-13 DIAGNOSIS — G8929 Other chronic pain: Secondary | ICD-10-CM | POA: Diagnosis not present

## 2021-01-13 DIAGNOSIS — M6281 Muscle weakness (generalized): Secondary | ICD-10-CM | POA: Diagnosis not present

## 2021-01-13 DIAGNOSIS — M62830 Muscle spasm of back: Secondary | ICD-10-CM | POA: Diagnosis not present

## 2021-01-13 DIAGNOSIS — M5442 Lumbago with sciatica, left side: Secondary | ICD-10-CM

## 2021-01-13 NOTE — Therapy (Signed)
[place], [place], [address] Phone: [phone]   Fax:  [phone]  Physical Therapy Treatment  Patient Details  Name: Gregory Santiago MRN: [id number] Date of Birth: October 20, 1951 Referring Provider (PT): Binnie Rail, MD   Encounter Date: 01/13/2021   PT End of Session - 01/13/21 0738    Visit Number 2    Number of Visits 9    Date for PT Re-Evaluation 03/04/21    Authorization Type [employer] - FOTO visit 6 and visit 10    PT Start Time [phone]    PT Stop Time 0830    PT Time Calculation (min) 47 min    Activity Tolerance Patient tolerated treatment well    Behavior During Therapy [facility] for tasks assessed/performed           Past Medical History:  Diagnosis Date  . Allergic rhinitis   . Allergy    seasonal allergies  . AR (allergic rhinitis) 10/28/2018  . Arthritis    generalized  . Blood transfusion without reported diagnosis 1996  . Coronary artery calcification seen on CAT scan 05/22/2019  . Cranial nerve VII palsy   . Duodenitis   . Ear pressure, right 02/27/2019  . Family history of diabetes mellitus in mother 05/21/2019  . Fundic gland polyps of stomach, benign   . GERD (gastroesophageal reflux disease)    on meds  . Hemorrhoids   . Hepatitis A    as a child  . 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 HLD (hyperlipidemia)    on meds  . Knee pain 04/23/2018  . Oropharyngeal dysphagia 06/02/2019  . 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  . 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 Sensorineural hearing  loss (SNHL) of both ears 06/02/2019  . Stye 05/22/2019    Past Surgical History:  Procedure Laterality Date  . arm surgery Left 1996   left forearm, ligaments, nerve and tendon repair, from accident  . CHEST SURGERY  1996   from accident-wound closure  . EYE SURGERY  11/2018   eye lid repair x 3   . HAND SURGERY Right 1996   middle, ring, and little finger filayed open in accident  . MENISCUS REPAIR Right 2018  . NASAL SEPTUM SURGERY  1979  . SALIVARY GLAND SURGERY Right    Nov 2019  . WISDOM TOOTH EXTRACTION      There were no vitals filed for this visit.   Subjective Assessment - 01/13/21 0738    Subjective "I was working on the step ladder yesterday, so it was a rough day. It's mostly staying right in the left buttocks, but I was also feeling a little in the middle of my back which I don't normally have. If I do my home stretches in a certain order then they are okay, but if I do it in a different order, I get cramps in my legs. I also feel like I have general weakness in both legs sometimes." Pain yesterday was 8/10 at the end of the day but is a 2-3/10 today.    Pertinent History See extensive PMH above  Limitations Standing;Sitting    How long can you sit comfortably? Usually no issues but pain does not worsen/ease with sitting if it is already present    How long can you stand comfortably? "Probably 3-4 minutes then it starts giving me a problem"    How long can you walk comfortably? "I never really paid attention to it. If it's bothering me, it doesn't get better but doesn't get worse"    Diagnostic tests 12/28/2020 Lumbar spine: Straightening of the expected lumbar lordosis, nonspecific though could be seen in the setting of muscle spasm. Severe DDD of L4-L5. Mild-to-moderate DDD within the remainder of the lumbar spine.    Patient Stated Goals I want to work full time comfortably without this pain    Currently in Pain? Yes    Pain Score 3     Pain Location Hip    Pain  Orientation Left;Posterior    Pain Descriptors / Indicators Tightness    Pain Type Chronic pain    Pain Onset More than a month ago              [facility] PT Assessment - 01/13/21 0001      Assessment   Medical Diagnosis Left lumbar radiculopathy (M54.16)    Referring Provider (PT) Binnie Rail, MD                         [facility] Adult PT Treatment/Exercise - 01/13/21 0001      Self-Care   Self-Care Other Self-Care Comments    Other Self-Care Comments  Updated HEP, reviewed innominate rotation using skeleton model and explained MET, provided TPDN handout an discussed expected response, provided tennis ball for self myofascial release      Lumbar Exercises: Stretches   Passive Hamstring Stretch Right;Left;3 reps;30 seconds    Passive Hamstring Stretch Limitations 2x with green strap in supine; 1x seated at EOM    Hip Flexor Stretch Right;Left;1 rep;30 seconds    Hip Flexor Stretch Limitations Thomas Risk manager Right;Left;1 rep;60 seconds    Gastroc Stretch Limitations Slant board on least amount of incline      Lumbar Exercises: Aerobic   Nustep L5 x 5 min LE only      Lumbar Exercises: Seated   Other Seated Lumbar Exercises L sciatic nerve glide x 3 for demonstration    Other Seated Lumbar Exercises Forward and lateral flexion with green swiss ball 5 x 5 sec each direction      Lumbar Exercises: Supine   Pelvic Tilt 20 reps    Clam 15 reps    Clam Limitations 2 x 15 with green theraband    Bridge with [employer] 15 reps    Bridge with [employer] Limitations 2 x 15. Cues for glute and core activation      Knee/Hip Exercises: Standing   Hip Abduction Stengthening;Both;2 sets;10 reps;Knee straight      Manual Therapy   Manual Therapy Muscle Energy Technique;Joint mobilization;Soft tissue mobilization;Myofascial release    Joint Mobilization Grades I-IV lumbar PA joint mobilizations    Soft tissue mobilization STM and myofascial release  along L lumbar paraspinals and L QL    Muscle Energy Technique Long sit test revealed anterior rotation of L innominate from supine to longsitting that was no longer apparent following MET (L hip EXT and R hip FL) 8 x 5 sec  PT Education - 01/13/21 0840    Education Details Updated HEP, reviewed innominate rotation using skeleton model and explained MET, provided TPDN handout an discussed expected response, provided tennis ball for self myofascial release    Person(s) Educated Patient    Methods Explanation;Demonstration;Tactile cues;Verbal cues;Handout    Comprehension Verbalized understanding;Returned demonstration            PT Short Term Goals - 01/02/21 1324      PT SHORT TERM GOAL #1   Title Patient will be independent with initial HEP.    Baseline Patient provided initial HEP during evaluation 01/02/2021.    Time 4    Period Weeks    Status New    Target Date 01/30/21      PT SHORT TERM GOAL #2   Title Patient will be able to perform L SB without pain in L hip and/or L low back.    Baseline Pt complains of pain in L hip and low back with L trunk/lumbar SB.    Time 4    Period Weeks    Status New    Target Date 01/30/21      PT SHORT TERM GOAL #3   Title Patient will be able to perform SLS on each leg > 10 seconds without loss of balance.    Baseline Pt with LOB and required stepping strategy after ~3 seconds on each leg to maintain balance.    Time 4    Period Weeks    Status New    Target Date 01/30/21      PT SHORT TERM GOAL #4   Title Pt will present with no significant innominate rotation upon long sit re-assessment.    Baseline anterior rotation of L innominate from supine to longsitting that was no longer apparent following MET    Time 4    Period Weeks    Status New    Target Date 01/30/21             PT Long Term Goals - 01/02/21 1327      PT LONG TERM GOAL #1   Title Patient will be independent with advanced HEP.     Baseline Patient provided initial HEP during evaluation 01/02/2021.    Time 8    Period Weeks    Status New    Target Date 02/27/21      PT LONG TERM GOAL #2   Title Patient's FOTO score will improve from 65% functional status to 70% function to demonstrate improved perceived ability.    Baseline 65% function; predicted 70% function    Time 8    Period Weeks    Status New    Target Date 02/27/21      PT LONG TERM GOAL #3   Title Patient will report </= 3/10 pain with no radicular symptoms with standing throughout workweek (at least 5 consecutive days).    Baseline Patient reports having increased pain after 3-4 minutes of static standing.    Time 8    Period Weeks    Status New    Target Date 02/27/21      PT LONG TERM GOAL #4   Title Patient will be able to ascend 10 steps forwards and descend 10 steps backwards with no significant increase in pain to mimic ability to navigate ladder (alternating LE each time pt ascends/descends).    Baseline Pt reports increased pain and some difficulty navigating ladder at work occasionally    Time 8  Period Weeks    Status New    Target Date 02/27/21                 Plan - 01/13/21 0739    Clinical Impression Statement Patient tolerated treatment session well with no adverse effects. He had aggravation of symptoms with shooting pains from L low back to L anterior shin during lumbar joint mobilizations and STM to L lumbar paraspinals that was eased after myofascial release, continued joint mobilizations, and interventions. He reports feeling "better" at end of session.    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 Assess response to HEP and update/progress as indicated. LLE sciatic nerve glides as indicated, manual techniques PRN. Reassess long sit test if indicated. TPDN if pt still interested. Steps to mimic climbing ladder/step stool (4-5 steps typically)    PT Home Exercise Plan [id number] - piriformis stretch, LTR, MET (L hip EXT, R hip FL) using broom, bridge with ball squeeze, hooklying clam (green), supine/seated HS stretch, Thomas stretch, PPT, standing hip ABD, sciatic nerve glides (PRN),    Consulted and Agree with Plan of Care Patient           Patient will benefit from skilled therapeutic intervention in order to improve the following deficits and impairments:  Decreased activity tolerance,Decreased balance,Decreased mobility,Decreased strength,Decreased endurance,Decreased range of motion,Increased muscle spasms,Pain  Visit Diagnosis: Chronic left-sided low back pain with left-sided sciatica  Muscle spasm of back  Muscle weakness (generalized)     Problem List Patient Active Problem List   Diagnosis Date Noted  . Left lumbar radiculopathy 12/28/2020  . Sudden left hearing loss 06/03/2020  . Osteoarthritis 06/03/2020  . Muscle spasms of neck 06/03/2020  . Oropharyngeal dysphagia 06/02/2019  . 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/02/2019  . Coronary artery calcification seen on CAT scan 05/22/2019   . Family history of diabetes mellitus in mother 05/21/2019  . 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 11/30[place]2018  . 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  . 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 AR (allergic rhinitis) 10/28/2018  . 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14[place]  . 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 Knee pain 04/23/2018  . GERD (gastroesophageal reflux disease)   . HLD (hyperlipidemia)      KHaydee Monica PT, DPT 01/13/21 8:58 AM  [facility]GMadill [place] [address]Phone: [phone]  Fax:  [phone] Name: Gregory ThornsberryMRN: [id number] of Birth: 1July 18, 1952

## 2021-01-16 ENCOUNTER — Ambulatory Visit: Payer: BC Managed Care – PPO

## 2021-01-16 ENCOUNTER — Other Ambulatory Visit: Payer: Self-pay

## 2021-01-16 DIAGNOSIS — M5442 Lumbago with sciatica, left side: Secondary | ICD-10-CM | POA: Diagnosis not present

## 2021-01-16 DIAGNOSIS — M6281 Muscle weakness (generalized): Secondary | ICD-10-CM

## 2021-01-16 DIAGNOSIS — G8929 Other chronic pain: Secondary | ICD-10-CM | POA: Diagnosis not present

## 2021-01-16 DIAGNOSIS — M62830 Muscle spasm of back: Secondary | ICD-10-CM | POA: Diagnosis not present

## 2021-01-16 NOTE — Therapy (Signed)
[place], [place], [address] Phone: [phone]   Fax:  [phone]  Physical Therapy Treatment  Patient Details  Name: Gregory Santiago MRN: [id number] Date of Birth: 09-09-1951 Referring Provider (PT): Binnie Rail, MD   Encounter Date: 01/16/2021   PT End of Session - 01/16/21 1039    Visit Number 3    Number of Visits 9    Date for PT Re-Evaluation 03/04/21    Authorization Type BCBS - FOTO visit 6 and visit 10    PT Start Time 1045    PT Stop Time 1127    PT Time Calculation (min) 42 min    Activity Tolerance Patient tolerated treatment well    Behavior During Therapy [facility] for tasks assessed/performed           Past Medical History:  Diagnosis Date  . Allergic rhinitis   . Allergy    seasonal allergies  . AR (allergic rhinitis) 10/28/2018  . Arthritis    generalized  . Blood transfusion without reported diagnosis 1996  . Coronary artery calcification seen on CAT scan 05/22/2019  . Cranial nerve VII palsy   . Duodenitis   . Ear pressure, right 02/27/2019  . Family history of diabetes mellitus in mother 05/21/2019  . Fundic gland polyps of stomach, benign   . GERD (gastroesophageal reflux disease)    on meds  . Hemorrhoids   . Hepatitis A    as a child  . 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 HLD (hyperlipidemia)    on meds  . Knee pain 04/23/2018  . Oropharyngeal dysphagia 06/02/2019  . 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  . 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 Sensorineural hearing  loss (SNHL) of both ears 06/02/2019  . Stye 05/22/2019    Past Surgical History:  Procedure Laterality Date  . arm surgery Left 1996   left forearm, ligaments, nerve and tendon repair, from accident  . CHEST SURGERY  1996   from accident-wound closure  . EYE SURGERY  11/2018   eye lid repair x 3   . HAND SURGERY Right 1996   middle, ring, and little finger filayed open in accident  . MENISCUS REPAIR Right 2018  . NASAL SEPTUM SURGERY  1979  . SALIVARY GLAND SURGERY Right    Nov 2019  . WISDOM TOOTH EXTRACTION      There were no vitals filed for this visit.   Subjective Assessment - 01/16/21 1039    Subjective "I had a rough Saturday after being on my feet and up and down the ladder. After doing my stretches throughout the day, I feel like that helped and was feeling okay on Sunday."    Pertinent History See extensive PMH above    Limitations Standing;Sitting    How long can you sit comfortably? Usually no issues but pain does not worsen/ease with sitting if it is already present    How long can you stand comfortably? "Probably 3-4 minutes then it starts giving me a problem"    How long can you walk comfortably? "I never really paid attention to it. If  it's bothering me, it doesn't get better but doesn't get worse"    Diagnostic tests 12/28/2020 Lumbar spine: Straightening of the expected lumbar lordosis, nonspecific though could be seen in the setting of muscle spasm. Severe DDD of L4-L5. Mild-to-moderate DDD within the remainder of the lumbar spine.    Patient Stated Goals I want to work full time comfortably without this pain    Currently in Pain? Yes    Pain Score 2    "pain today is a 2 if any at all"   Pain Location Hip    Pain Orientation Left;Posterior    Pain Descriptors / Indicators Tightness    Pain Type Chronic pain    Pain Onset More than a month ago              [facility] PT Assessment - 01/16/21 0001      Assessment   Medical Diagnosis Left lumbar radiculopathy  (M54.16)    Referring Provider (PT) Binnie Rail, MD      [place] residence    Type of [place] Access Level entry    [place] Two level   stairs up to 1 room that pt does not use   Home Equipment None                         [facility] Adult PT Treatment/Exercise - 01/16/21 0001      Lumbar Exercises: Stretches   Passive Hamstring Stretch Right;Left;1 rep;60 seconds    Passive Hamstring Stretch Limitations seated at EOM    Other Lumbar Stretch Exercise Seated forward and lateral flexion 5 x 3 sec each direction with green swiss ball following TPDN      Lumbar Exercises: Aerobic   Nustep L6 x 5 min LE only      Lumbar Exercises: Supine   Bridge with [employer] 15 reps      Lumbar Exercises: Quadruped   Other Quadruped Lumbar Exercises Fire hydrant 2 x 15      Knee/Hip Exercises: Clinical research associate Both;2 reps;30 seconds      Knee/Hip Exercises: Standing   Hip Abduction Stengthening;Both;2 sets;15 reps;Knee straight    Abduction Limitations 2# ankle weights    Hip Extension Stengthening;Both;2 sets;15 reps;Knee straight    Extension Limitations 2# ankle weights      Manual Therapy   Manual Therapy Soft tissue mobilization;Myofascial release    Manual therapy comments Skilled inspection, palpation, and observation during TPDN of L lumbar multifidi by [employer].    Soft tissue mobilization STM and myofascial release along L lumbar paraspinals, L QL, and glute max    Muscle Energy Technique Long sit test revealed anterior rotation of L innominate from supine to longsitting - pt then performed self- MET            Trigger Point Dry Needling - 01/16/21 0001    Consent Given? Yes    Education Handout Provided Previously provided    Muscles Treated Back/Hip Lumbar multifidi   L   Lumbar multifidi Response Twitch response elicited                PT Education - 01/16/21 1239     Education Details Reviewed HEP and self MET, discussed stretching and strengthening for increased hip stability, reiterated expected response with TPDN.    Person(s) Educated Patient    Methods Explanation;Verbal cues    Comprehension  Verbalized understanding;Returned demonstration            PT Short Term Goals - 01/02/21 1324      PT SHORT TERM GOAL #1   Title Patient will be independent with initial HEP.    Baseline Patient provided initial HEP during evaluation 01/02/2021.    Time 4    Period Weeks    Status New    Target Date 01/30/21      PT SHORT TERM GOAL #2   Title Patient will be able to perform L SB without pain in L hip and/or L low back.    Baseline Pt complains of pain in L hip and low back with L trunk/lumbar SB.    Time 4    Period Weeks    Status New    Target Date 01/30/21      PT SHORT TERM GOAL #3   Title Patient will be able to perform SLS on each leg > 10 seconds without loss of balance.    Baseline Pt with LOB and required stepping strategy after ~3 seconds on each leg to maintain balance.    Time 4    Period Weeks    Status New    Target Date 01/30/21      PT SHORT TERM GOAL #4   Title Pt will present with no significant innominate rotation upon long sit re-assessment.    Baseline anterior rotation of L innominate from supine to longsitting that was no longer apparent following MET    Time 4    Period Weeks    Status New    Target Date 01/30/21             PT Long Term Goals - 01/02/21 1327      PT LONG TERM GOAL #1   Title Patient will be independent with advanced HEP.    Baseline Patient provided initial HEP during evaluation 01/02/2021.    Time 8    Period Weeks    Status New    Target Date 02/27/21      PT LONG TERM GOAL #2   Title Patient's FOTO score will improve from 65% functional status to 70% function to demonstrate improved perceived ability.    Baseline 65% function; predicted 70% function    Time 8    Period Weeks     Status New    Target Date 02/27/21      PT LONG TERM GOAL #3   Title Patient will report </= 3/10 pain with no radicular symptoms with standing throughout workweek (at least 5 consecutive days).    Baseline Patient reports having increased pain after 3-4 minutes of static standing.    Time 8    Period Weeks    Status New    Target Date 02/27/21      PT LONG TERM GOAL #4   Title Patient will be able to ascend 10 steps forwards and descend 10 steps backwards with no significant increase in pain to mimic ability to navigate ladder (alternating LE each time pt ascends/descends).    Baseline Pt reports increased pain and some difficulty navigating ladder at work occasionally    Time 8    Period Weeks    Status New    Target Date 02/27/21                 Plan - 01/16/21 1040    Clinical Impression Statement Patient tolerated interventions and TPDN with no adverse effects. He experienced aggravation of  symptoms to L anterior shin again this session during initial STM that was relieved following self-MET, exercises, and TPDN. He expressed having some soreness in L lower lumbar region as expected but no increased pain. He continues to have aggravation of symptoms during work and with prolonged standing (especially during SLS on L) but is able to ease symptoms with consistently performing HEP. He should continue to benefit from skilled PT to allow for improved tolerance with functional activities, such as climbing ladders for work tasks.    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 Update HEP pending tolerance to previous session. LLE sciatic nerve glides as indicated, manual techniques PRN. Reassess long sit test if indicated. Steps to mimic climbing ladder/step stool (4-5 steps typically). Hip strengthening exercises for increased stability. Tandem half kneeling on foam with perturbations.    PT Home Exercise Plan [id number] - piriformis stretch, LTR, MET (L hip EXT, R hip FL) using broom, bridge with ball squeeze, hooklying clam (green), supine/seated HS stretch, Thomas stretch, PPT, standing hip ABD, sciatic nerve glides (PRN),    Consulted and Agree with Plan of Care Patient           Patient will benefit from skilled therapeutic intervention in order to improve the following deficits and impairments:  Decreased activity tolerance,Decreased balance,Decreased mobility,Decreased strength,Decreased endurance,Decreased range of motion,Increased muscle spasms,Pain  Visit Diagnosis: Chronic left-sided low back pain with left-sided sciatica  Muscle spasm of back  Muscle weakness (generalized)     Problem List Patient Active Problem List   Diagnosis Date Noted  . Left lumbar radiculopathy 12/28/2020  . Sudden left hearing loss 06/03/2020  . Osteoarthritis 06/03/2020  . Muscle spasms of neck 06/03/2020  . Oropharyngeal dysphagia 06/02/2019  . 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/02/2019  . Coronary artery calcification seen on CAT scan 05/22/2019  . Family history of diabetes mellitus  in mother 05/21/2019  . 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  . 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 AR (allergic rhinitis) 10/28/2018  . 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[place] 10/14/2018  . 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 Knee pain 04/23/2018  . GERD (gastroesophageal reflux disease)   . HLD (hyperlipidemia)      KHaydee Monica PT, DPT 01/16/21 12:42 PM  [facility]GHill[address] [place] [address]Phone: [phone]  Fax:  [phone] Name: DFayez SturgellMRN: [id number] of Birth: [birth year]

## 2021-01-17 DIAGNOSIS — J309 Allergic rhinitis, unspecified: Secondary | ICD-10-CM | POA: Insufficient documentation

## 2021-01-17 DIAGNOSIS — K649 Unspecified hemorrhoids: Secondary | ICD-10-CM | POA: Insufficient documentation

## 2021-01-17 DIAGNOSIS — B159 Hepatitis A without hepatic coma: Secondary | ICD-10-CM | POA: Insufficient documentation

## 2021-01-17 DIAGNOSIS — G510 Bell's palsy: Secondary | ICD-10-CM | POA: Insufficient documentation

## 2021-01-17 DIAGNOSIS — K2980 Duodenitis without bleeding: Secondary | ICD-10-CM | POA: Insufficient documentation

## 2021-01-17 DIAGNOSIS — D131 Benign neoplasm of stomach: Secondary | ICD-10-CM | POA: Insufficient documentation

## 2021-01-17 DIAGNOSIS — T7840XA Allergy, unspecified, initial encounter: Secondary | ICD-10-CM | POA: Insufficient documentation

## 2021-01-17 DIAGNOSIS — M1990 Unspecified osteoarthritis, unspecified site: Secondary | ICD-10-CM | POA: Insufficient documentation

## 2021-01-21 NOTE — Progress Notes (Signed)
Cardiology Office Note:    Date:  01/23/2021   ID:  Gregory Santiago, DOB 01/08/1951, MRN [id number]  PCP:  Binnie Rail, MD  Cardiologist:  Shirlee More, MD    Referring MD: Binnie Rail, MD    ASSESSMENT:    1. Elevated lipoprotein(a)   2. Coronary artery calcification seen on CAT scan   3. Chest pain of uncertain etiology    PLAN:    In order of problems listed above:  1. He is at increased cardiovascular risk with a very high calcium score not having typical angina will undergo myocardial perfusion study for risk stratification if he had high risk markers would benefit from further evaluation such as coronary angiography.  He will continue his current medical treatment including high intensity statin and PCSK9 therapy recheck labs including CMP lipid profile LP(a) level.   Next appointment: 6 months   Medication Adjustments/Labs and Tests Ordered: Current medicines are reviewed at length with the patient today.  Concerns regarding medicines are outlined above.  Orders Placed This Encounter  Procedures  . Comprehensive metabolic panel  . Lipid panel  . Lipoprotein A (LPA)  . MYOCARDIAL PERFUSION IMAGING   Meds ordered this encounter  Medications  . Evolocumab (REPATHA SURECLICK) 270 MG/ML SOAJ    Sig: Inject 1 Dose into the skin every 14 (fourteen) days.    Dispense:  6 mL    Refill:  3    Approved NDCs [address]-01, [id number], [phone], [phone]    Chief Complaint  Patient presents with  . Follow-up    Coronary artery calcification with elevated LP(a) level    History of Present Illness:    Gregory Santiago is a 70 y.o. male with a hx of coronary artery Calcification on CT scan very high coronary artery calcium score 1603 94th percentile hyperlipidemia and very elevated LP(a) level 261 subsequently seen in lipid clinic is initiating PCSK9 therapy.  He was last seen by me 07/08/2020.  Response to lipid-lowering therapy with an LDL of 28 9 months  ago Compliance with diet, lifestyle and medications: Yes  He has made a good recovery from his cancer.  No evidence of recurrence working full-time as Clinical biochemist go for college.  He tolerates lipid-lowering therapy without muscle pain or weakness and has had no chest pain shortness of breath palpitation or syncope. Past Medical History:  Diagnosis Date  . Allergic rhinitis   . Allergy    seasonal allergies  . AR (allergic rhinitis) 10/28/2018  . Arthritis    generalized  . Blood transfusion without reported diagnosis 1996  . Coronary artery calcification seen on CAT scan 05/22/2019  . Cranial nerve VII palsy   . Duodenitis   . Ear pressure, right 02/27/2019  . Family history of diabetes mellitus in mother 05/21/2019  . Fundic gland polyps of stomach, benign   . GERD (gastroesophageal reflux disease)    on meds  . Hemorrhoids   . Hepatitis A    as a child  . 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 HLD (hyperlipidemia)    on meds  . Knee pain 04/23/2018  . Oropharyngeal dysphagia 06/02/2019  . 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  . 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/02/2019  . Stye 05/22/2019    Past Surgical History:  Procedure Laterality Date  . arm surgery Left 1996   left forearm, ligaments, nerve and tendon repair, from accident  . CHEST SURGERY  1996   from accident-wound closure  . EYE SURGERY  11/2018   eye lid repair x 3   . HAND SURGERY Right 1996   middle, ring, and little finger filayed open in accident  .  MENISCUS REPAIR Right 2018  . NASAL SEPTUM SURGERY  1979  . SALIVARY GLAND SURGERY Right    Nov 2019  . WISDOM TOOTH EXTRACTION      Current Medications: Current Meds  Medication Sig  . cyclobenzaprine (FLEXERIL) 5 MG tablet Take 1 tablet (5 mg total) by mouth 3 (three) times daily as needed for muscle spasms.  . fluticasone (FLONASE) 50 MCG/ACT nasal spray Place 2 sprays into both nostrils at bedtime.  . gabapentin (NEURONTIN) 100 MG capsule Take 1-2 capsules (100-200 mg total) by mouth at bedtime.  . hydrocortisone (ANUSOL-HC) 2.5 % rectal cream Place 1 application rectally 2 (two) times daily as needed for hemorrhoids or itching. Keep on file - does not need filled now  . meloxicam (MOBIC) 15 MG tablet Take 1 tablet (15 mg total) by mouth daily as needed for pain.  . Multiple Vitamin (MULTIVITAMIN) tablet Take 1 tablet by mouth daily.  Marland Kitchen omeprazole (PRILOSEC) 40 MG capsule TAKE 1 CAPSULE BY MOUTH DAILY  . rosuvastatin (CRESTOR) 20 MG tablet Take 1 tablet (20 mg total) by mouth daily.  . sodium fluoride (FLUORISHIELD) 1.1 % GEL dental gel USE ONCE DAILY AFTER BRUSHING. PLACE IN TRAY FOR 5 MINUTES. DO NOT RINSE OR EAT 30 MINUTES AFTER APPLICATION  . [DISCONTINUED] Evolocumab (REPATHA SURECLICK) 601 MG/ML SOAJ Inject 1 Dose into the skin every 14 (fourteen) days.     Allergies:   Patient has no known allergies.   Social History   Socioeconomic History  . Marital status: Married    Spouse name: Not on file  . Number of children: 3  . Years of education: 1 year of college  . Highest education level: Some college, no degree  Occupational History  . Occupation: Programmer, systems: [employer]  Tobacco Use  . Smoking status: Former Smoker    Packs/day: 0.50    Years: 5.00    Pack years: 2.50    Types: Cigarettes    Quit date: 12/24/1982    Years since quitting: 38.1  . Smokeless tobacco: Never Used  Vaping Use  . Vaping Use: Never used  Substance and Sexual Activity   . Alcohol use: Yes    Comment: 1 x per month  . Drug use: No  . Sexual activity: Not on file  Other Topics Concern  . Not on file  Social History Narrative   Exercise: not currently running   Lives at home with wife    Right handed   Caffeine: 6 cups daily   Social Determinants of Health   Financial Resource Strain: Not on file  Food Insecurity: Not on file  Transportation Needs: Not on file  Physical Activity: Not on file  Stress: Not on file  Social Connections: Not on file     Family History: The patient's family history includes Arthritis in his father, mother, and paternal grandmother; Colon polyps (age of onset: 33) in  his mother; Diabetes in his mother; Heart attack in his paternal grandfather; Heart disease in his paternal grandfather; Lymphoma (age of onset: 79) in his father; Parkinson's disease in his mother; Prostate cancer (age of onset: 44) in his father. There is no history of Colon cancer, Esophageal cancer, Stomach cancer, or Rectal cancer. ROS:   Please see the history of present illness.    All other systems reviewed and are negative.  EKGs/Labs/Other Studies Reviewed:    The following studies were reviewed today:    Recent Labs: 06/03/2020: Hemoglobin 14.0; Platelets 182.0; TSH 1.76 07/08/2020: ALT 17; BUN 16; Creatinine, Ser 0.91; Potassium 4.7; Sodium 140  Recent Lipid Panel    Component Value Date/Time   CHOL 104 07/08/2020 1151   TRIG 99 07/08/2020 1151   HDL 46 07/08/2020 1151   CHOLHDL 2.3 07/08/2020 1151   CHOLHDL 5 05/22/2019 0830   VLDL 26.0 05/22/2019 0830   LDLCALC 39 07/08/2020 1151   LDLDIRECT 155.9 01/29/2014 1028    Physical Exam:    VS:  BP 122/62 (BP Location: Right Arm, Patient Position: Sitting, Cuff Size: Normal)   Pulse 75   Ht 5\' 11"  (1.803 m)   Wt 182 lb (82.6 kg)   SpO2 96%   BMI 25.38 kg/m     Wt Readings from Last 3 Encounters:  01/23/21 182 lb (82.6 kg)  12/28/20 185 lb (83.9 kg)  12/21/20 180 lb (81.6 kg)      GEN:  Well nourished, well developed in no acute distress HEENT: Normal NECK: No JVD; No carotid bruits LYMPHATICS: No lymphadenopathy CARDIAC: RRR, no murmurs, rubs, gallops RESPIRATORY:  Clear to auscultation without rales, wheezing or rhonchi  ABDOMEN: Soft, non-tender, non-distended MUSCULOSKELETAL:  No edema; No deformity  SKIN: Warm and dry NEUROLOGIC:  Alert and oriented x 3 PSYCHIATRIC:  Normal affect    Signed, Shirlee More, MD  01/23/2021 5:02 PM    [facility]

## 2021-01-23 ENCOUNTER — Other Ambulatory Visit: Payer: Self-pay

## 2021-01-23 ENCOUNTER — Ambulatory Visit (INDEPENDENT_AMBULATORY_CARE_PROVIDER_SITE_OTHER): Payer: BC Managed Care – PPO | Admitting: Cardiology

## 2021-01-23 ENCOUNTER — Ambulatory Visit: Payer: BC Managed Care – PPO | Admitting: Physical Therapy

## 2021-01-23 ENCOUNTER — Encounter: Payer: Self-pay | Admitting: Cardiology

## 2021-01-23 VITALS — BP 122/62 | HR 75 | Ht 71.0 in | Wt 182.0 lb

## 2021-01-23 DIAGNOSIS — M6281 Muscle weakness (generalized): Secondary | ICD-10-CM | POA: Diagnosis not present

## 2021-01-23 DIAGNOSIS — E7849 Other hyperlipidemia: Secondary | ICD-10-CM | POA: Diagnosis not present

## 2021-01-23 DIAGNOSIS — E7841 Elevated Lipoprotein(a): Secondary | ICD-10-CM | POA: Diagnosis not present

## 2021-01-23 DIAGNOSIS — G8929 Other chronic pain: Secondary | ICD-10-CM | POA: Diagnosis not present

## 2021-01-23 DIAGNOSIS — I2510 Atherosclerotic heart disease of native coronary artery without angina pectoris: Secondary | ICD-10-CM

## 2021-01-23 DIAGNOSIS — R079 Chest pain, unspecified: Secondary | ICD-10-CM

## 2021-01-23 DIAGNOSIS — M62830 Muscle spasm of back: Secondary | ICD-10-CM | POA: Diagnosis not present

## 2021-01-23 DIAGNOSIS — M5442 Lumbago with sciatica, left side: Secondary | ICD-10-CM | POA: Diagnosis not present

## 2021-01-23 MED ORDER — REPATHA SURECLICK 140 MG/ML ~~LOC~~ SOAJ: 1.0000 | SUBCUTANEOUS | 3 refills | Status: DC

## 2021-01-23 NOTE — Patient Instructions (Signed)
Medication Instructions:  Your physician recommends that you continue on your current medications as directed. Please refer to the Current Medication list given to you today.  *If you need a refill on your cardiac medications before your next appointment, please call your pharmacy*   Lab Work: Your physician recommends that you return for lab work in: TODAY CMP, Lipids, Lpa If you have labs (blood work) drawn today and your tests are completely normal, you will receive your results only by: . MyChart Message (if you have MyChart) OR . A paper copy in the mail If you have any lab test that is abnormal or we need to change your treatment, we will call you to review the results.   Testing/Procedures:   [facility] [address], [address] [place], [place] [address] Phone: [phone]    Please arrive 15 minutes prior to your appointment time for registration and insurance purposes.  The test will take approximately 3 to 4 hours to complete; you may bring reading material.  If someone comes with you to your appointment, they will need to remain in the main lobby due to limited space in the testing area. **If you are pregnant or breastfeeding, please notify the nuclear lab prior to your appointment**  How to prepare for your Myocardial Perfusion Test: . Do not eat or drink 3 hours prior to your test, except you may have water. . Do not consume products containing caffeine (regular or decaffeinated) 12 hours prior to your test. (ex: coffee, chocolate, sodas, tea). . Do bring a list of your current medications with you.  If not listed below, you may take your medications as normal. . Do wear comfortable clothes (no dresses or overalls) and walking shoes, tennis shoes preferred (No heels or open toe shoes are allowed). . Do NOT wear cologne, perfume, aftershave, or lotions (deodorant is allowed). . If these instructions are not followed, your  test will have to be rescheduled.  Please report to [address], [address] for your test.  If you have questions or concerns about your appointment, you can call the Nuclear Lab at [phone].  If you cannot keep your appointment, please provide 24 hours notification to the Nuclear Lab, to avoid a possible $50 charge to your account.    Follow-Up: At [facility], you and your health needs are our priority.  As part of our continuing mission to provide you with exceptional heart care, we have created designated Provider Care Teams.  These Care Teams include your primary Cardiologist (physician) and Advanced Practice Providers (APPs -  Physician Assistants and Nurse Practitioners) who all work together to provide you with the care you need, when you need it.  We recommend signing up for the patient portal called "MyChart".  Sign up information is provided on this After Visit Summary.  MyChart is used to connect with patients for Virtual Visits (Telemedicine).  Patients are able to view lab/test results, encounter notes, upcoming appointments, etc.  Non-urgent messages can be sent to your provider as well.   To learn more about what you can do with MyChart, go to [email or web address].    Your next appointment:   6 month(s)  The format for your next appointment:   In Person  Provider:   Brian Munley, MD   Other Instructions   

## 2021-01-23 NOTE — Therapy (Signed)
[place], [place], [address] Phone: [phone]   Fax:  [phone]  Physical Therapy Treatment  Patient Details  Name: Gregory Santiago MRN: [id number] Date of Birth: [birth year] Referring Provider (PT): Binnie Rail, MD   Encounter Date: 01/23/2021   PT End of Session - 01/23/21 1030    Visit Number 4    Number of Visits 9    Date for PT Re-Evaluation 03/04/21    Authorization Type BCBS - FOTO visit 6 and visit 10    PT Start Time 1027   pt arrived late   PT Stop Time 1100    PT Time Calculation (min) 33 min    Activity Tolerance Patient tolerated treatment well    Behavior During Therapy [facility] for tasks assessed/performed           Past Medical History:  Diagnosis Date  . Allergic rhinitis   . Allergy    seasonal allergies  . AR (allergic rhinitis) 10/28/2018  . Arthritis    generalized  . Blood transfusion without reported diagnosis 1996  . Coronary artery calcification seen on CAT scan 05/22/2019  . Cranial nerve VII palsy   . Duodenitis   . Ear pressure, right 02/27/2019  . Family history of diabetes mellitus in mother 05/21/2019  . Fundic gland polyps of stomach, benign   . GERD (gastroesophageal reflux disease)    on meds  . Hemorrhoids   . Hepatitis A    as a child  . 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 HLD (hyperlipidemia)    on meds  . Knee pain 04/23/2018  . Oropharyngeal dysphagia 06/02/2019  . 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  . 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/02/2019  . Stye 05/22/2019    Past Surgical History:  Procedure Laterality Date  . arm surgery Left 1996   left forearm, ligaments, nerve and tendon repair, from accident  . CHEST SURGERY  1996   from accident-wound closure  . EYE SURGERY  11/2018   eye lid repair x 3   . HAND SURGERY Right 1996   middle, ring, and little finger filayed open in accident  . MENISCUS REPAIR Right 2018  . NASAL SEPTUM SURGERY  1979  . SALIVARY GLAND SURGERY Right    Nov 2019  . WISDOM TOOTH EXTRACTION      There were no vitals filed for this visit.   Subjective Assessment - 01/23/21 1029    Subjective " the DN really helped, it calmed down alot but still sore.I am no longer getting the LLE referred pain all the time."    Patient Stated Goals I want to work full time comfortably without this pain    Currently in Pain? Yes    Pain Score 3     Pain Orientation Left    Pain Type Chronic pain    Pain Onset More than a month ago              [facility] PT Assessment - 01/23/21 0001      Assessment   Medical  Diagnosis Left lumbar radiculopathy (M54.16)                         [facility] Adult PT Treatment/Exercise - 01/23/21 0001      Lumbar Exercises: Sidelying   Hip Abduction Left   x3 going to fatigue     Manual Therapy   Manual Therapy Other (comment)    Manual therapy comments Skilled inspection, palpation, and observation during TPDN of L lumbar multifidi by [place].    Muscle Energy Technique MTPR along the adductor longus x 3            Trigger Point Dry Needling - 01/23/21 0001    Consent Given? Yes    Education Handout Provided Previously provided    Lumbar multifidi Response Twitch response elicited;Palpable increased muscle length   L               PT Education - 01/23/21 1058    Education Details reviewed HEP and updated today. anamoty of the SIJ    Person(s) Educated Patient    Methods  Explanation;Verbal cues;Handout    Comprehension Verbalized understanding;Verbal cues required            PT Short Term Goals - 01/02/21 1324      PT SHORT TERM GOAL #1   Title Patient will be independent with initial HEP.    Baseline Patient provided initial HEP during evaluation 01/02/2021.    Time 4    Period Weeks    Status New    Target Date 01/30/21      PT SHORT TERM GOAL #2   Title Patient will be able to perform L SB without pain in L hip and/or L low back.    Baseline Pt complains of pain in L hip and low back with L trunk/lumbar SB.    Time 4    Period Weeks    Status New    Target Date 01/30/21      PT SHORT TERM GOAL #3   Title Patient will be able to perform SLS on each leg > 10 seconds without loss of balance.    Baseline Pt with LOB and required stepping strategy after ~3 seconds on each leg to maintain balance.    Time 4    Period Weeks    Status New    Target Date 01/30/21      PT SHORT TERM GOAL #4   Title Pt will present with no significant innominate rotation upon long sit re-assessment.    Baseline anterior rotation of L innominate from supine to longsitting that was no longer apparent following MET    Time 4    Period Weeks    Status New    Target Date 01/30/21             PT Long Term Goals - 01/02/21 1327      PT LONG TERM GOAL #1   Title Patient will be independent with advanced HEP.    Baseline Patient provided initial HEP during evaluation 01/02/2021.    Time 8    Period Weeks    Status New    Target Date 02/27/21      PT LONG TERM GOAL #2   Title Patient's FOTO score will improve from 65% functional status to 70% function to demonstrate improved perceived ability.    Baseline 65% function; predicted 70% function    Time 8    Period Weeks  Status New    Target Date 02/27/21      PT LONG TERM GOAL #3   Title Patient will report </= 3/10 pain with no radicular symptoms with standing throughout workweek (at least 5 consecutive  days).    Baseline Patient reports having increased pain after 3-4 minutes of static standing.    Time 8    Period Weeks    Status New    Target Date 02/27/21      PT LONG TERM GOAL #4   Title Patient will be able to ascend 10 steps forwards and descend 10 steps backwards with no significant increase in pain to mimic ability to navigate ladder (alternating LE each time pt ascends/descends).    Baseline Pt reports increased pain and some difficulty navigating ladder at work occasionally    Time 8    Period Weeks    Status New    Target Date 02/27/21                 Plan - 01/23/21 1053    Clinical Impression Statement pt reports significant relief following the last session with DN. limited session due to pt arriving late.He continues to repport 3/10 SIJ pain and upon furhter assessment appears to have an outflare on the L. followeing MTPR along the adductors and glute med strengthening he reported decreased pain with standing and walking. continued TPDN focusing on the L lumbar multifidi.    PT Treatment/Interventions ADLs/Self Care Home Management;Aquatic Therapy;Cryotherapy;Electrical Stimulation;Iontophoresis 79m/ml Dexamethasone;Moist Heat;Traction;Neuromuscular re-education;Balance training;Therapeutic exercise;Therapeutic activities;Functional mobility training;Stair training;Patient/family education;Manual techniques;Dry needling;Passive range of motion;Taping    PT Next Visit Plan Assess response to TPDN. Update HEP pending tolerance to previous session. LLE sciatic nerve glides as indicated, manual techniques PRN. Reassess long sit test if indicated. Steps to mimic climbing ladder/step stool (4-5 steps typically). Hip strengthening exercises for increased stability. Tandem half kneeling on foam with perturbations.    PT Home Exercise Plan [id number] - piriformis stretch, LTR, MET (L hip EXT, R hip FL) using broom, bridge with ball squeeze, hooklying clam (green), supine/seated HS  stretch, Thomas stretch, PPT, standing hip ABD, sciatic nerve glides (PRN), adductor stretch (seated and standing), sidelyin ghip abduction    Consulted and Agree with Plan of Care Patient           Patient will benefit from skilled therapeutic intervention in order to improve the following deficits and impairments:  Decreased activity tolerance,Decreased balance,Decreased mobility,Decreased strength,Decreased endurance,Decreased range of motion,Increased muscle spasms,Pain  Visit Diagnosis: Chronic left-sided low back pain with left-sided sciatica  Muscle spasm of back  Muscle weakness (generalized)     Problem List Patient Active Problem List   Diagnosis Date Noted  . Hepatitis A   . Hemorrhoids   . Fundic gland polyps of stomach, benign   . Duodenitis   . Cranial nerve VII palsy   . Arthritis   . Allergy   . Allergic rhinitis   . Left lumbar radiculopathy 12/28/2020  . Sudden left hearing loss 06/03/2020  . Osteoarthritis 06/03/2020  . Muscle spasms of neck 06/03/2020  . Oropharyngeal dysphagia 06/02/2019  . 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/02/2019  . Coronary artery calcification seen on CAT scan 05/22/2019  . Stye 05/22/2019  . Family history of diabetes mellitus in mother 05/21/2019  . Ear pressure, right 02/27/2019  . 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  . Paralytic lagophthalmos of right upper  eyelid 11/12/2018  . AR (allergic rhinitis) 10/28/2018  . 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 Knee pain 04/23/2018  . GERD (gastroesophageal reflux disease)   . HLD (hyperlipidemia)   . Blood transfusion without reported diagnosis [address] PT, DPT, LAT, ATC  01/23/21  11:00 AM      [place], [place], [address] Phone: [phone]   Fax:  [phone]  Name: Gregory Santiago MRN: [id number] Date of Birth: [birth year]

## 2021-01-24 LAB — COMPREHENSIVE METABOLIC PANEL
ALT: 22 IU/L (ref 0–44)
AST: 22 IU/L (ref 0–40)
Albumin/Globulin Ratio: 2.1 (ref 1.2–2.2)
Albumin: 4.8 g/dL (ref 3.8–4.8)
Alkaline Phosphatase: 67 IU/L (ref 44–121)
BUN/Creatinine Ratio: 18 (ref 10–24)
BUN: 18 mg/dL (ref 8–27)
Bilirubin Total: 0.3 mg/dL (ref 0.0–1.2)
CO2: 25 mmol/L (ref 20–29)
Calcium: 9.7 mg/dL (ref 8.6–10.2)
Chloride: 105 mmol/L (ref 96–106)
Creatinine, Ser: 1.01 mg/dL (ref 0.76–1.27)
GFR calc Af Amer: 88 mL/min/{1.73_m2} (ref >59)
GFR calc non Af Amer: 76 mL/min/{1.73_m2} (ref >59)
Globulin, Total: 2.3 g/dL (ref 1.5–4.5)
Glucose: 95 mg/dL (ref 65–99)
Potassium: 4.6 mmol/L (ref 3.5–5.2)
Sodium: 141 mmol/L (ref 134–144)
Total Protein: 7.1 g/dL (ref 6.0–8.5)

## 2021-01-24 LAB — LIPID PANEL
Chol/HDL Ratio: 2.1 ratio (ref 0.0–5.0)
Cholesterol, Total: 98 mg/dL — ABNORMAL LOW (ref 100–199)
HDL: 47 mg/dL (ref >39)
LDL Chol Calc (NIH): 32 mg/dL (ref 0–99)
Triglycerides: 101 mg/dL (ref 0–149)
VLDL Cholesterol Cal: 19 mg/dL (ref 5–40)

## 2021-01-24 LAB — LIPOPROTEIN A (LPA): Lipoprotein (a): 213.3 nmol/L — ABNORMAL HIGH (ref <75.0)

## 2021-01-25 ENCOUNTER — Telehealth: Payer: Self-pay

## 2021-01-25 NOTE — Telephone Encounter (Signed)
Spoke with patient regarding results and recommendation.  Patient verbalizes understanding and is agreeable to plan of care. Advised patient to call back with any issues or concerns.  

## 2021-01-25 NOTE — Telephone Encounter (Signed)
Patient is returning call.  °

## 2021-01-25 NOTE — Telephone Encounter (Signed)
Left message on patients voicemail to please return our call.   

## 2021-01-25 NOTE — Telephone Encounter (Signed)
-----   Message from Richardo Priest, MD sent at 01/25/2021  7:50 AM EST ----- Good results lipids are at target no change in treatment

## 2021-01-26 ENCOUNTER — Telehealth (HOSPITAL_COMMUNITY): Payer: Self-pay

## 2021-01-26 ENCOUNTER — Telehealth: Payer: Self-pay

## 2021-01-26 NOTE — Telephone Encounter (Signed)
Attestation order has been placed for lexiscan.

## 2021-01-26 NOTE — Addendum Note (Signed)
Addended by: Shirlee More on: 01/26/2021 09:26 AM   Modules accepted: Orders

## 2021-01-26 NOTE — Telephone Encounter (Signed)
Spoke with the patient's wife, detailed instructions given. Ok per [employer]. She stated that she would give him the message and have him here for his test. Asked to call back with any questions. S.[place] EMTP

## 2021-01-30 ENCOUNTER — Other Ambulatory Visit: Payer: Self-pay

## 2021-01-30 ENCOUNTER — Ambulatory Visit: Payer: BC Managed Care – PPO | Attending: Internal Medicine

## 2021-01-30 DIAGNOSIS — M6281 Muscle weakness (generalized): Secondary | ICD-10-CM

## 2021-01-30 DIAGNOSIS — M5442 Lumbago with sciatica, left side: Secondary | ICD-10-CM | POA: Insufficient documentation

## 2021-01-30 DIAGNOSIS — G8929 Other chronic pain: Secondary | ICD-10-CM | POA: Diagnosis not present

## 2021-01-30 DIAGNOSIS — M62830 Muscle spasm of back: Secondary | ICD-10-CM | POA: Insufficient documentation

## 2021-01-31 ENCOUNTER — Encounter (HOSPITAL_COMMUNITY): Payer: BC Managed Care – PPO

## 2021-01-31 NOTE — Therapy (Signed)
[place], [place], [address] Phone: [phone]   Fax:  [phone]  Physical Therapy Treatment  Patient Details  Name: Gregory Santiago MRN: [id number] Date of Birth: February 29, 1951 Referring Provider (PT): Binnie Rail, MD   Encounter Date: 01/30/2021   PT End of Session - 01/30/21 1534    Visit Number 5    Number of Visits 9    Date for PT Re-Evaluation 03/04/21    Authorization Type BCBS - FOTO visit 6 and visit 10    PT Start Time 1530    PT Stop Time 1613    PT Time Calculation (min) 43 min    Activity Tolerance Patient tolerated treatment well    Behavior During Therapy [facility] for tasks assessed/performed           Past Medical History:  Diagnosis Date  . Allergic rhinitis   . Allergy    seasonal allergies  . AR (allergic rhinitis) 10/28/2018  . Arthritis    generalized  . Blood transfusion without reported diagnosis 1996  . Coronary artery calcification seen on CAT scan 05/22/2019  . Cranial nerve VII palsy   . Duodenitis   . Ear pressure, right 02/27/2019  . Family history of diabetes mellitus in mother 05/21/2019  . Fundic gland polyps of stomach, benign   . GERD (gastroesophageal reflux disease)    on meds  . Hemorrhoids   . Hepatitis A    as a child  . 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 HLD (hyperlipidemia)    on meds  . Knee pain 04/23/2018  . Oropharyngeal dysphagia 06/02/2019  . 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  . 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 Sensorineural hearing  loss (SNHL) of both ears 06/02/2019  . Stye 05/22/2019    Past Surgical History:  Procedure Laterality Date  . arm surgery Left 1996   left forearm, ligaments, nerve and tendon repair, from accident  . CHEST SURGERY  1996   from accident-wound closure  . EYE SURGERY  11/2018   eye lid repair x 3   . HAND SURGERY Right 1996   middle, ring, and little finger filayed open in accident  . MENISCUS REPAIR Right 2018  . NASAL SEPTUM SURGERY  1979  . SALIVARY GLAND SURGERY Right    Nov 2019  . WISDOM TOOTH EXTRACTION      There were no vitals filed for this visit.   Subjective Assessment - 01/30/21 1532    Subjective "The needling made a big difference. Once in a while I will get a little twinge in my back but it is more seldom now."    Pertinent History See extensive PMH above    Limitations Standing;Sitting    How long can you sit comfortably? Usually no issues but pain does not worsen/ease with sitting if it is already present    How long can you stand comfortably? "Probably 3-4 minutes then it starts giving me a problem"    How long can you walk comfortably? "I never really paid attention to it. If it's bothering me, it doesn't get better but doesn't  get worse"    Diagnostic tests 12/28/2020 Lumbar spine: Straightening of the expected lumbar lordosis, nonspecific though could be seen in the setting of muscle spasm. Severe DDD of L4-L5. Mild-to-moderate DDD within the remainder of the lumbar spine.    Patient Stated Goals I want to work full time comfortably without this pain    Currently in Pain? Yes    Pain Score 3     Pain Location Hip    Pain Orientation Left    Pain Descriptors / Indicators Tightness    Pain Type Chronic pain    Pain Onset More than a month ago              [facility] PT Assessment - 01/31/21 0001      Assessment   Medical Diagnosis Left lumbar radiculopathy (M54.16)    Referring Provider (PT) Binnie Rail, MD                         [facility]  Adult PT Treatment/Exercise - 01/31/21 0001      Self-Care   Self-Care Other Self-Care Comments    Other Self-Care Comments  Reviewed HEP, use of butterknife handle for self IASTM along L tibialis anterior      Lumbar Exercises: Stretches   Piriformis Stretch Right;Left;1 rep;60 seconds      Lumbar Exercises: Aerobic   Nustep L6 x 5 min LE only      Lumbar Exercises: Supine   Clam 15 reps   2 x 15   Clam Limitations green theraband    Bridge with [employer] 10 reps   3 x 10   Bridge with [employer] Limitations Cues for sequence/form; core and glute activation      Lumbar Exercises: Sidelying   Hip Abduction Left;10 reps   3 x 10     Lumbar Exercises: Quadruped   Other Quadruped Lumbar Exercises Bird dog x 15 each UE/LE with cues to slow pace and for form    Other Quadruped Lumbar Exercises Fire hydrant 2 x 15      Knee/Hip Exercises: Stretches   Other Knee/Hip Stretches Tibialis anterior stretch while seated at EOM x 60 sec      Knee/Hip Exercises: Standing   Forward Step Up 20 reps;Step Height: 6";Both    Forward Step Up Limitations L knee drive when ascending with RLE    Other Standing Knee Exercises Tandem half kneeling on airex with perturbations x 2 min on each LE      Manual Therapy   Manual Therapy Soft tissue mobilization    Soft tissue mobilization STM and IASTM along distal/mid tibialis anterior    Muscle Energy Technique Long sit test revealed anterior rotation of L innominate from supine to longsitting - pt then performed self- MET                  PT Education - 01/31/21 0938    Education Details Reviewed HEP, use of butterknife handle for self IASTM along L tibialis anterior    Person(s) Educated Patient    Methods Explanation;Demonstration;Verbal cues    Comprehension Verbalized understanding;Returned demonstration            PT Short Term Goals - 01/30/21 1539      PT SHORT TERM GOAL #1   Title Patient will be independent with initial  HEP.    Baseline Patient provided initial HEP during evaluation 01/02/2021.    Time 4    Period  Weeks    Status Achieved    Target Date 01/30/21      PT SHORT TERM GOAL #2   Title Patient will be able to perform L SB without pain in L hip and/or L low back.    Baseline Pt complains of pain in L hip and low back with L trunk/lumbar SB. Minimal pain 2-3/10 during L SB but not terrible pain    Time 4    Period Weeks    Status Partially Met    Target Date 01/30/21      PT SHORT TERM GOAL #3   Title Patient will be able to perform SLS on each leg > 10 seconds without loss of balance.    Baseline Pt able to maintain SLS on each LE for > 10 sec today    Time 4    Period Weeks    Status Achieved    Target Date 01/30/21      PT SHORT TERM GOAL #4   Title Pt will present with no significant innominate rotation upon long sit re-assessment.    Baseline 01/30/2021: anterior rotation of L innominate from supine to longsitting that was no longer apparent following MET    Time 4    Period Weeks    Status On-going    Target Date 01/30/21             PT Long Term Goals - 01/02/21 1327      PT LONG TERM GOAL #1   Title Patient will be independent with advanced HEP.    Baseline Patient provided initial HEP during evaluation 01/02/2021.    Time 8    Period Weeks    Status New    Target Date 02/27/21      PT LONG TERM GOAL #2   Title Patient's FOTO score will improve from 65% functional status to 70% function to demonstrate improved perceived ability.    Baseline 65% function; predicted 70% function    Time 8    Period Weeks    Status New    Target Date 02/27/21      PT LONG TERM GOAL #3   Title Patient will report </= 3/10 pain with no radicular symptoms with standing throughout workweek (at least 5 consecutive days).    Baseline Patient reports having increased pain after 3-4 minutes of static standing.    Time 8    Period Weeks    Status New    Target Date 02/27/21      PT LONG  TERM GOAL #4   Title Patient will be able to ascend 10 steps forwards and descend 10 steps backwards with no significant increase in pain to mimic ability to navigate ladder (alternating LE each time pt ascends/descends).    Baseline Pt reports increased pain and some difficulty navigating ladder at work occasionally    Time 8    Period Weeks    Status New    Target Date 02/27/21                 Plan - 01/30/21 1534    Clinical Impression Statement Patient tolerated session well with fatigue in LLE but no significant increase in pain or radicular symptoms. Progressed core strengthening and hip stability exercises today following MET for anterior rotation of L innominate that was no longer apparent afterwards. He has achieved 2 STG and partially met 1 STG and should continue to benefit from skilled PT to further improve tolerance with functional tasks, such  as negotiating ladders and step stools for work.    Personal Factors and Comorbidities Age;Comorbidity 3+;Profession;Time since onset of injury/illness/exacerbation;Past/Current Experience    Comorbidities See extensive PMH above    Examination-Activity Limitations Stand;Stairs;Other    Examination-Participation Restrictions Occupation;Other    PT Treatment/Interventions ADLs/Self Care Home Management;Aquatic Therapy;Cryotherapy;Electrical Stimulation;Iontophoresis 21m/ml Dexamethasone;Moist Heat;Traction;Neuromuscular re-education;Balance training;Therapeutic exercise;Therapeutic activities;Functional mobility training;Stair training;Patient/family education;Manual techniques;Dry needling;Passive range of motion;Taping    PT Next Visit Plan Update HEP pending tolerance to previous session. LLE sciatic nerve glides as indicated, manual techniques PRN. Reassess long sit test if indicated. Steps to mimic climbing ladder/step stool (4-5 steps typically). Hip strengthening exercises for increased stability. L hip ABD strength and ADD stretching     PT Home Exercise Plan [id number] - piriformis stretch, LTR, MET (L hip EXT, R hip FL) using broom, bridge with ball squeeze, hooklying clam (green), supine/seated HS stretch, Thomas stretch, PPT, standing hip ABD, sciatic nerve glides (PRN), adductor stretch (seated and standing), sidelyin ghip abduction    Consulted and Agree with Plan of Care Patient           Patient will benefit from skilled therapeutic intervention in order to improve the following deficits and impairments:  Decreased activity tolerance,Decreased balance,Decreased mobility,Decreased strength,Decreased endurance,Decreased range of motion,Increased muscle spasms,Pain  Visit Diagnosis: Chronic left-sided low back pain with left-sided sciatica  Muscle spasm of back  Muscle weakness (generalized)     Problem List Patient Active Problem List   Diagnosis Date Noted  . Hepatitis A   . Hemorrhoids   . Fundic gland polyps of stomach, benign   . Duodenitis   . Cranial nerve VII palsy   . Arthritis   . Allergy   . Allergic rhinitis   . Left lumbar radiculopathy 12/28/2020  . Sudden left hearing loss 06/03/2020  . Osteoarthritis 06/03/2020  . Muscle spasms of neck 06/03/2020  . Oropharyngeal dysphagia 06/02/2019  . 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/02/2019  . Coronary artery calcification seen on CAT scan 05/22/2019  . Stye 05/22/2019  . Family history of diabetes mellitus in mother 05/21/2019  . Ear pressure, right 02/27/2019  . 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  . 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 AR (allergic rhinitis) 10/28/2018  . 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 10/14/2018  . 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 Knee pain 04/23/2018  . GERD (gastroesophageal reflux disease)   . HLD (hyperlipidemia)   . Blood transfusion without reported diagnosis  [place] PT, DPT 01/31/21 9:49 AM  [place] [address]Phone: [phone]  Fax:  [phone] Name: Gregory HarrisonMRN: [id number] of Birth: [birth year]

## 2021-02-02 ENCOUNTER — Other Ambulatory Visit: Payer: Self-pay | Admitting: Cardiology

## 2021-02-03 MED ORDER — ROSUVASTATIN CALCIUM 20 MG PO TABS: 20.0000 mg | ORAL_TABLET | Freq: Every day | ORAL | 3 refills | Status: DC

## 2021-02-06 ENCOUNTER — Other Ambulatory Visit: Payer: Self-pay

## 2021-02-06 ENCOUNTER — Ambulatory Visit: Payer: BC Managed Care – PPO

## 2021-02-06 DIAGNOSIS — H6121 Impacted cerumen, right ear: Secondary | ICD-10-CM | POA: Diagnosis not present

## 2021-02-06 DIAGNOSIS — M62830 Muscle spasm of back: Secondary | ICD-10-CM

## 2021-02-06 DIAGNOSIS — H9042 Sensorineural hearing loss, unilateral, left ear, with unrestricted hearing on the contralateral side: Secondary | ICD-10-CM | POA: Diagnosis not present

## 2021-02-06 DIAGNOSIS — M6281 Muscle weakness (generalized): Secondary | ICD-10-CM | POA: Diagnosis not present

## 2021-02-06 DIAGNOSIS — G8929 Other chronic pain: Secondary | ICD-10-CM | POA: Diagnosis not present

## 2021-02-06 DIAGNOSIS — M5442 Lumbago with sciatica, left side: Secondary | ICD-10-CM | POA: Diagnosis not present

## 2021-02-06 DIAGNOSIS — H903 Sensorineural hearing loss, bilateral: Secondary | ICD-10-CM | POA: Diagnosis not present

## 2021-02-06 NOTE — Therapy (Signed)
[place], [place], [address] Phone: [phone]   Fax:  [phone]  Physical Therapy Treatment  Patient Details  Name: Gregory Santiago MRN: [id number] Date of Birth: November 12, 1951 Referring Provider (PT): Binnie Rail, MD   Encounter Date: 02/06/2021   PT End of Session - 02/06/21 1048    Visit Number 6    Number of Visits 9    Date for PT Re-Evaluation 03/04/21    Authorization Type BCBS - FOTO visit 10    PT Start Time 1045    PT Stop Time 1128    PT Time Calculation (min) 43 min    Activity Tolerance Patient tolerated treatment well    Behavior During Therapy [facility] for tasks assessed/performed           Past Medical History:  Diagnosis Date  . Allergic rhinitis   . Allergy    seasonal allergies  . AR (allergic rhinitis) 10/28/2018  . Arthritis    generalized  . Blood transfusion without reported diagnosis 1996  . Coronary artery calcification seen on CAT scan 05/22/2019  . Cranial nerve VII palsy   . Duodenitis   . Ear pressure, right 02/27/2019  . Family history of diabetes mellitus in mother 05/21/2019  . Fundic gland polyps of stomach, benign   . GERD (gastroesophageal reflux disease)    on meds  . Hemorrhoids   . Hepatitis A    as a child  . 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 HLD (hyperlipidemia)    on meds  . Knee pain 04/23/2018  . Oropharyngeal dysphagia 06/02/2019  . 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  . 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 Sensorineural hearing loss (SNHL)  of both ears 06/02/2019  . Stye 05/22/2019    Past Surgical History:  Procedure Laterality Date  . arm surgery Left 1996   left forearm, ligaments, nerve and tendon repair, from accident  . CHEST SURGERY  1996   from accident-wound closure  . EYE SURGERY  11/2018   eye lid repair x 3   . HAND SURGERY Right 1996   middle, ring, and little finger filayed open in accident  . MENISCUS REPAIR Right 2018  . NASAL SEPTUM SURGERY  1979  . SALIVARY GLAND SURGERY Right    Nov 2019  . WISDOM TOOTH EXTRACTION      There were no vitals filed for this visit.   Subjective Assessment - 02/06/21 1145    Subjective Pt reports noting improvement in his symptoms with very mild dull pain remaining and occasional "chill" in left low back. He states that he is able to perform stretches and relieve symptoms when they occur in L gluteal region. He says his L anterior shin pain has not been bothering him over the past week. He notes some stiffness when getting out of the car or getting up after prolonged sitting that just takes time to ease once he is up and moving.    Pertinent History See extensive PMH above    Limitations Standing;Sitting    How long can you sit comfortably? Usually no issues but pain  does not worsen/ease with sitting if it is already present    How long can you stand comfortably? "Probably 3-4 minutes then it starts giving me a problem"    How long can you walk comfortably? "I never really paid attention to it. If it's bothering me, it doesn't get better but doesn't get worse"    Diagnostic tests 12/28/2020 Lumbar spine: Straightening of the expected lumbar lordosis, nonspecific though could be seen in the setting of muscle spasm. Severe DDD of L4-L5. Mild-to-moderate DDD within the remainder of the lumbar spine.    Patient Stated Goals I want to work full time comfortably without this pain    Currently in Pain? Yes    Pain Score 2     Pain Location Hip    Pain Orientation Left    Pain  Descriptors / Indicators Tightness    Pain Onset More than a month ago              [facility] PT Assessment - 02/06/21 0001      Assessment   Medical Diagnosis Left lumbar radiculopathy (M54.16)    Referring Provider (PT) Binnie Rail, MD      Observation/Other Assessments   Focus on Therapeutic Outcomes (FOTO)  70% function; predicted 72% function      Special Tests   Other special tests No innominate rotation noted with long sit test this session                         [facility] Adult PT Treatment/Exercise - 02/06/21 0001      Self-Care   Self-Care Other Self-Care Comments    Other Self-Care Comments  See pt education      Lumbar Exercises: Stretches   Piriformis Stretch Right;Left;1 rep;60 seconds      Lumbar Exercises: Aerobic   Nustep L7 x 5 min LE only      Lumbar Exercises: Supine   Bridge Limitations bridge with BLE on green swiss ball 2 x 15      Lumbar Exercises: Sidelying   Hip Abduction Left;15 reps   3 x 15     Lumbar Exercises: Quadruped   Other Quadruped Lumbar Exercises Bird dog x 15 each UE/LE    Other Quadruped Lumbar Exercises Fire hydrant 2 x 15      Knee/Hip Exercises: Stretches   Passive Hamstring Stretch Right;Left;1 rep;60 seconds    Hip Flexor Stretch Limitations Prone hip FL stretch x 30 sec each LE    Other Knee/Hip Stretches Demonstrated and pt returned demonstration of lunge stretch (forward and lateral) and standing hip FL stretch      Knee/Hip Exercises: Standing   Hip Flexion Limitations Alternating marches on BOSU (blue) x 15 each LE - increased L ankle INV during LLE stance but no LOB    Hip Extension Stengthening;Both;2 sets;15 reps;Knee straight    Extension Limitations green theraband at ankles    Other Standing Knee Exercises Tandem half kneeling on airex with perturbations then while performing resisted horizontal ADD across body (red therband with PT holding other end)                  PT Education -  02/06/21 1133    Education Details Updated HEP    Person(s) Educated Patient    Methods Explanation;Demonstration;Verbal cues    Comprehension Verbalized understanding;Returned demonstration            PT Short Term Goals - 01/30/21 1539  PT SHORT TERM GOAL #1   Title Patient will be independent with initial HEP.    Baseline Patient provided initial HEP during evaluation 01/02/2021.    Time 4    Period Weeks    Status Achieved    Target Date 01/30/21      PT SHORT TERM GOAL #2   Title Patient will be able to perform L SB without pain in L hip and/or L low back.    Baseline Pt complains of pain in L hip and low back with L trunk/lumbar SB. Minimal pain 2-3/10 during L SB but not terrible pain    Time 4    Period Weeks    Status Partially Met    Target Date 01/30/21      PT SHORT TERM GOAL #3   Title Patient will be able to perform SLS on each leg > 10 seconds without loss of balance.    Baseline Pt able to maintain SLS on each LE for > 10 sec today    Time 4    Period Weeks    Status Achieved    Target Date 01/30/21      PT SHORT TERM GOAL #4   Title Pt will present with no significant innominate rotation upon long sit re-assessment.    Baseline 01/30/2021: anterior rotation of L innominate from supine to longsitting that was no longer apparent following MET    Time 4    Period Weeks    Status On-going    Target Date 01/30/21             PT Long Term Goals - 02/06/21 1104      PT LONG TERM GOAL #1   Title Patient will be independent with advanced HEP.    Baseline Patient provided initial HEP during evaluation 01/02/2021.    Time 8    Period Weeks    Status On-going      PT LONG TERM GOAL #2   Title Patient's FOTO score will improve from 70% functional status to 72% function to demonstrate improved perceived ability.    Baseline 70% function; predicted 72% function. Update: will improve from 70% to 72%    Time 8    Period Weeks    Status Revised      PT  LONG TERM GOAL #3   Title Patient will report </= 3/10 pain with no radicular symptoms with standing throughout workweek (at least 5 consecutive days).    Baseline Better tolerance with work activities with minimal pain and "chill in L low back"    Time 8    Period Weeks    Status Partially Met      PT LONG TERM GOAL #4   Title Patient will be able to ascend 10 steps forwards and descend 10 steps backwards with no significant increase in pain to mimic ability to navigate ladder (alternating LE each time pt ascends/descends).    Baseline Pt reports increased pain and some difficulty navigating ladder at work occasionally    Time 8    Period Weeks    Status On-going                 Plan - 02/06/21 1133    Clinical Impression Statement Patient tolerated treatment session well with no increase in pain or radicular symptoms. He was able to progress 3 x 15 sidelying L hip ABD compared to previously being fatigued by 10th repetition during set. He should benefit from progressing hip stretching and  strengthening over next few sessions to see if pt has continued decrease in symptoms consistently and prepare for D/C so pt can continue performing HEP independently.    Personal Factors and Comorbidities Age;Comorbidity 3+;Profession;Time since onset of injury/illness/exacerbation;Past/Current Experience    Comorbidities See extensive PMH above    Examination-Activity Limitations Stand;Stairs;Other    Examination-Participation Restrictions Occupation;Other    PT Treatment/Interventions ADLs/Self Care Home Management;Aquatic Therapy;Cryotherapy;Electrical Stimulation;Iontophoresis 88m/ml Dexamethasone;Moist Heat;Traction;Neuromuscular re-education;Balance training;Therapeutic exercise;Therapeutic activities;Functional mobility training;Stair training;Patient/family education;Manual techniques;Dry needling;Passive range of motion;Taping    PT Next Visit Plan Update HEP PRN. LLE sciatic nerve glides as  indicated, manual techniques PRN. Reassess long sit test if indicated. Hip strengthening exercises for increased stability. L hip ABD strength and ADD stretching    PT Home Exercise Plan [id number] - piriformis stretch, LTR, MET (L hip EXT, R hip FL) using broom, bridge with ball squeeze, hooklying clam (green), supine/seated HS stretch, Thomas stretch, PPT, standing hip ABD, sciatic nerve glides (PRN), adductor stretch (seated and standing), sidelying hip abduction, bird dog, fire hydrant, hip FL stretch (standing or prone), forward/lateral lunge stretches    Consulted and Agree with Plan of Care Patient           Patient will benefit from skilled therapeutic intervention in order to improve the following deficits and impairments:  Decreased activity tolerance,Decreased balance,Decreased mobility,Decreased strength,Decreased endurance,Decreased range of motion,Increased muscle spasms,Pain  Visit Diagnosis: Chronic left-sided low back pain with left-sided sciatica  Muscle spasm of back  Muscle weakness (generalized)     Problem List Patient Active Problem List   Diagnosis Date Noted  . Hepatitis A   . Hemorrhoids   . Fundic gland polyps of stomach, benign   . Duodenitis   . Cranial nerve VII palsy   . Arthritis   . Allergy   . Allergic rhinitis   . Left lumbar radiculopathy 12/28/2020  . Sudden left hearing loss 06/03/2020  . Osteoarthritis 06/03/2020  . Muscle spasms of neck 06/03/2020  . Oropharyngeal dysphagia 06/02/2019  . 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/02/2019  . Coronary artery calcification seen on CAT scan 05/22/2019  . Stye 05/22/2019  . Family history of diabetes mellitus in mother 05/21/2019  . Ear pressure, right 02/27/2019  . 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  . 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  . 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 AR (allergic rhinitis) 10/28/2018   . 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  . 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 Knee pain 04/23/2018  . GERD (gastroesophageal reflux disease)   . HLD (hyperlipidemia)   . Blood transfusion without reported diagnosis 1Caro PT, DPT 02/06/21 12:06 PM  [facility] [address]Phone: [phone]  Fax:  [phone] Name: Gregory WingMRN: [id number] of Birth: [birth year]

## 2021-02-07 DIAGNOSIS — C07 Malignant neoplasm of parotid gland: Secondary | ICD-10-CM | POA: Diagnosis not present

## 2021-02-13 ENCOUNTER — Other Ambulatory Visit: Payer: Self-pay

## 2021-02-13 ENCOUNTER — Telehealth (HOSPITAL_COMMUNITY): Payer: Self-pay | Admitting: *Deleted

## 2021-02-13 ENCOUNTER — Ambulatory Visit: Payer: BC Managed Care – PPO

## 2021-02-13 ENCOUNTER — Encounter (HOSPITAL_COMMUNITY): Payer: Self-pay | Admitting: *Deleted

## 2021-02-13 DIAGNOSIS — M5442 Lumbago with sciatica, left side: Secondary | ICD-10-CM | POA: Diagnosis not present

## 2021-02-13 DIAGNOSIS — M62830 Muscle spasm of back: Secondary | ICD-10-CM | POA: Diagnosis not present

## 2021-02-13 DIAGNOSIS — G8929 Other chronic pain: Secondary | ICD-10-CM

## 2021-02-13 DIAGNOSIS — M6281 Muscle weakness (generalized): Secondary | ICD-10-CM

## 2021-02-13 NOTE — Therapy (Signed)
[place], [place], [address] Phone: [phone]   Fax:  [phone]  Physical Therapy Treatment  Patient Details  Name: Gregory Santiago MRN: [id number] Date of Birth: 1951-04-21 Referring Provider (PT): Binnie Rail, MD   Encounter Date: 02/13/2021   PT End of Session - 02/13/21 1049    Visit Number 7    Number of Visits 9    Date for PT Re-Evaluation 03/04/21    Authorization Type BCBS - FOTO visit 10    PT Start Time 1046    PT Stop Time 1130    PT Time Calculation (min) 44 min    Activity Tolerance Patient tolerated treatment well    Behavior During Therapy [facility] for tasks assessed/performed           Past Medical History:  Diagnosis Date  . Allergic rhinitis   . Allergy    seasonal allergies  . AR (allergic rhinitis) 10/28/2018  . Arthritis    generalized  . Blood transfusion without reported diagnosis 1996  . Coronary artery calcification seen on CAT scan 05/22/2019  . Cranial nerve VII palsy   . Duodenitis   . Ear pressure, right 02/27/2019  . Family history of diabetes mellitus in mother 05/21/2019  . Fundic gland polyps of stomach, benign   . GERD (gastroesophageal reflux disease)    on meds  . Hemorrhoids   . Hepatitis A    as a child  . 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 HLD (hyperlipidemia)    on meds  . Knee pain 04/23/2018  . Oropharyngeal dysphagia 06/02/2019  . 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  . 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 Sensorineural hearing loss (SNHL)  of both ears 06/02/2019  . Stye 05/22/2019    Past Surgical History:  Procedure Laterality Date  . arm surgery Left 1996   left forearm, ligaments, nerve and tendon repair, from accident  . CHEST SURGERY  1996   from accident-wound closure  . EYE SURGERY  11/2018   eye lid repair x 3   . HAND SURGERY Right 1996   middle, ring, and little finger filayed open in accident  . MENISCUS REPAIR Right 2018  . NASAL SEPTUM SURGERY  1979  . SALIVARY GLAND SURGERY Right    Nov 2019  . WISDOM TOOTH EXTRACTION      There were no vitals filed for this visit.   Subjective Assessment - 02/13/21 1049    Subjective Pt reports having more pain in L hip recently and in L shin with prolonged standing. He states that he has to stop what he is doing when his symptoms are aggravated to let it rest until pain subsides. He did step into a "hole" in the ground over this weekend while dogwalking.    Pertinent History See extensive PMH above    Limitations Standing;Sitting    How long can you sit comfortably? Usually no issues but pain does not worsen/ease with sitting if it is already present    How long can you stand comfortably? "Probably 3-4 minutes then it starts giving me a problem"  How long can you walk comfortably? "I never really paid attention to it. If it's bothering me, it doesn't get better but doesn't get worse"    Diagnostic tests 12/28/2020 Lumbar spine: Straightening of the expected lumbar lordosis, nonspecific though could be seen in the setting of muscle spasm. Severe DDD of L4-L5. Mild-to-moderate DDD within the remainder of the lumbar spine.    Patient Stated Goals I want to work full time comfortably without this pain    Currently in Pain? Yes    Pain Score 5     Pain Location Hip    Pain Orientation Left    Pain Descriptors / Indicators --   "chill"   Pain Onset More than a month ago              [facility] PT Assessment - 02/13/21 0001      Assessment   Medical Diagnosis Left  lumbar radiculopathy (M54.16)    Referring Provider (PT) Binnie Rail, MD                         [facility] Adult PT Treatment/Exercise - 02/13/21 0001      Lumbar Exercises: Stretches   Single Knee to Chest Stretch Right;Left;2 reps;60 seconds    Lower Trunk Rotation Limitations 10 x 5 sec each direction    Hip Flexor Stretch Limitations Thomas stretch LLE x 90 sec    Standing Side Bend Limitations R SB in doorway x 90 sec    Other Lumbar Stretch Exercise Self L tibialis anterior STM/MFR      Lumbar Exercises: Aerobic   Nustep L7 x 5 min LE only      Lumbar Exercises: Standing   Functional Squats Limitations Goblet squat with 15# with cues for technique and to avoid knees going past toes during deep squat    Other Standing Lumbar Exercises Monster walks 2 x 12 feet and lateral stepping with green theraband 4 x 12 feet (band proximal to B knees)    Other Standing Lumbar Exercises Hip hinge with dowel x 15 then deadlift with 45# KB x 15      Manual Therapy   Manual Therapy Soft tissue mobilization;Myofascial release;Manual Traction    Soft tissue mobilization STM/MFR along L tibialis anterior    Manual Traction LLE LAD with gentle oscillations                  PT Education - 02/13/21 1144    Education Details Provided green band for lateral stepping and monster walks. Discussed continued STM/MFR along tibialis anterior in addition to glute med strengthening and awareness with body mechanics when bending/lifting to avoid lumbar strain. Potential TPDN along tibialis anterior if pt continues to have tightness and trigger points.    Person(s) Educated Patient    Methods Explanation;Demonstration;Verbal cues;Tactile cues    Comprehension Verbalized understanding;Returned demonstration            PT Short Term Goals - 01/30/21 1539      PT SHORT TERM GOAL #1   Title Patient will be independent with initial HEP.    Baseline Patient provided initial HEP during  evaluation 01/02/2021.    Time 4    Period Weeks    Status Achieved    Target Date 01/30/21      PT SHORT TERM GOAL #2   Title Patient will be able to perform L SB without pain in L hip and/or L low back.  Baseline Pt complains of pain in L hip and low back with L trunk/lumbar SB. Minimal pain 2-3/10 during L SB but not terrible pain    Time 4    Period Weeks    Status Partially Met    Target Date 01/30/21      PT SHORT TERM GOAL #3   Title Patient will be able to perform SLS on each leg > 10 seconds without loss of balance.    Baseline Pt able to maintain SLS on each LE for > 10 sec today    Time 4    Period Weeks    Status Achieved    Target Date 01/30/21      PT SHORT TERM GOAL #4   Title Pt will present with no significant innominate rotation upon long sit re-assessment.    Baseline 01/30/2021: anterior rotation of L innominate from supine to longsitting that was no longer apparent following MET    Time 4    Period Weeks    Status On-going    Target Date 01/30/21             PT Long Term Goals - 02/06/21 1104      PT LONG TERM GOAL #1   Title Patient will be independent with advanced HEP.    Baseline Patient provided initial HEP during evaluation 01/02/2021.    Time 8    Period Weeks    Status On-going      PT LONG TERM GOAL #2   Title Patient's FOTO score will improve from 70% functional status to 72% function to demonstrate improved perceived ability.    Baseline 70% function; predicted 72% function. Update: will improve from 70% to 72%    Time 8    Period Weeks    Status Revised      PT LONG TERM GOAL #3   Title Patient will report </= 3/10 pain with no radicular symptoms with standing throughout workweek (at least 5 consecutive days).    Baseline Better tolerance with work activities with minimal pain and "chill in L low back"    Time 8    Period Weeks    Status Partially Met      PT LONG TERM GOAL #4   Title Patient will be able to ascend 10 steps  forwards and descend 10 steps backwards with no significant increase in pain to mimic ability to navigate ladder (alternating LE each time pt ascends/descends).    Baseline Pt reports increased pain and some difficulty navigating ladder at work occasionally    Time 8    Period Weeks    Status On-going                 Plan - 02/13/21 1146    Clinical Impression Statement Patient tolerated session well with fatigue but no complaints of increased pain or radicular symptoms. Continued glute med strengthening and STM/MFR along L tibialis anterior with some relief in symptoms. Initiated squatting and lifting to review body mechanics to assess how patient bends and lifts items up to 50# at work. He required cues to maintain neutral spine and perform hip hinge without knees passing over toes excessively. Discussed plan to continue addressing deficits and assess response over next couple sessions then potentially refer back to physician if pt does not experience relief.    Personal Factors and Comorbidities Age;Comorbidity 3+;Profession;Time since onset of injury/illness/exacerbation;Past/Current Experience    Comorbidities See extensive PMH above    Examination-Activity Limitations Stand;Stairs;Other  Examination-Participation Restrictions Occupation;Other    PT Treatment/Interventions ADLs/Self Care Home Management;Aquatic Therapy;Cryotherapy;Electrical Stimulation;Iontophoresis 64m/ml Dexamethasone;Moist Heat;Traction;Neuromuscular re-education;Balance training;Therapeutic exercise;Therapeutic activities;Functional mobility training;Stair training;Patient/family education;Manual techniques;Dry needling;Passive range of motion;Taping    PT Next Visit Plan Update HEP PRN. LLE sciatic nerve glides as indicated, manual techniques PRN. Reassess long sit test if indicated. Hip strengthening exercises for increased stability. L hip ABD strength and ADD stretching. Manual along L tibialis anterior and  potential TPDN. Lifting and squatting for body mechanics at work    PT Home Exercise Plan [id number] - piriformis stretch, LTR, MET (L hip EXT, R hip FL) using broom, bridge with ball squeeze, hooklying clam (green), supine/seated HS stretch, Thomas stretch, PPT, standing hip ABD, sciatic nerve glides (PRN), adductor stretch (seated and standing), sidelying hip abduction, bird dog, fire hydrant, hip FL stretch (standing or prone), forward/lateral lunge stretches, lateral stepping, monster walks    Consulted and Agree with Plan of Care Patient           Patient will benefit from skilled therapeutic intervention in order to improve the following deficits and impairments:  Decreased activity tolerance,Decreased balance,Decreased mobility,Decreased strength,Decreased endurance,Decreased range of motion,Increased muscle spasms,Pain  Visit Diagnosis: Chronic left-sided low back pain with left-sided sciatica  Muscle spasm of back  Muscle weakness (generalized)     Problem List Patient Active Problem List   Diagnosis Date Noted  . Hepatitis A   . Hemorrhoids   . Fundic gland polyps of stomach, benign   . Duodenitis   . Cranial nerve VII palsy   . Arthritis   . Allergy   . Allergic rhinitis   . Left lumbar radiculopathy 12/28/2020  . Sudden left hearing loss 06/03/2020  . Osteoarthritis 06/03/2020  . Muscle spasms of neck 06/03/2020  . Oropharyngeal dysphagia 06/02/2019  . 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/02/2019  . Coronary artery calcification seen on CAT scan 05/22/2019  . Stye 05/22/2019  . Family history of diabetes mellitus in mother 05/21/2019  . Ear pressure, right 02/27/2019  . 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  . 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  . 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 AR (allergic rhinitis) 10/28/2018  . 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 10/14[place]2018   . 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 Knee pain 04/23/2018  . GERD (gastroesophageal reflux disease)   . HLD (hyperlipidemia)   . Blood transfusion without reported diagnosis 1[place]ard PT, DPT 02/13/21 11:57 AM  [place] [address]Phone: [phone]  Fax:  [phone] Name: Gregory InglettMRN: [id number] of Birth: [birth year]

## 2021-02-13 NOTE — Telephone Encounter (Signed)
Attempted to reach patient regarding upcoming appointment- no answer, unable to leave a message. Letter sent via my chart.  Kirstie Peri

## 2021-02-14 ENCOUNTER — Telehealth (HOSPITAL_COMMUNITY): Payer: Self-pay | Admitting: *Deleted

## 2021-02-14 ENCOUNTER — Telehealth: Payer: Self-pay

## 2021-02-14 NOTE — Telephone Encounter (Signed)
Patient given detailed instructions per Myocardial Perfusion Study Information Sheet for the test on 02/15/21 at 0745. Patient notified to arrive 15 minutes early and that it is imperative to arrive on time for appointment to keep from having the test rescheduled.  If you need to cancel or reschedule your appointment, please call the office within 24 hours of your appointment. . Patient verbalized understanding.Gregory Santiago, Ranae [place]

## 2021-02-14 NOTE — Telephone Encounter (Signed)
PA approved for [place] from 02-07-21 until 02-06-22.

## 2021-02-15 ENCOUNTER — Other Ambulatory Visit: Payer: Self-pay

## 2021-02-15 ENCOUNTER — Ambulatory Visit (HOSPITAL_COMMUNITY): Payer: BC Managed Care – PPO | Attending: Internal Medicine

## 2021-02-15 DIAGNOSIS — R079 Chest pain, unspecified: Secondary | ICD-10-CM | POA: Diagnosis not present

## 2021-02-15 LAB — MYOCARDIAL PERFUSION IMAGING
LV dias vol: 107 mL (ref 62–150)
LV sys vol: 49 mL
Peak HR: 84 {beats}/min
Rest HR: 62 {beats}/min
SDS: 1
SRS: 0
SSS: 1
TID: 1.1

## 2021-02-15 MED ORDER — REGADENOSON 0.4 MG/5ML IV SOLN
0.4000 mg | Freq: Once | INTRAVENOUS | Status: AC
  Administered 2021-02-15: 0.4 mg via INTRAVENOUS

## 2021-02-15 MED ORDER — TECHNETIUM TC 99M TETROFOSMIN IV KIT
31.7000 | PACK | Freq: Once | INTRAVENOUS | Status: AC | PRN
  Administered 2021-02-15: 31.7 via INTRAVENOUS
  Filled 2021-02-15: qty 32

## 2021-02-15 MED ORDER — TECHNETIUM TC 99M TETROFOSMIN IV KIT
10.2000 | PACK | Freq: Once | INTRAVENOUS | Status: AC | PRN
  Administered 2021-02-15: 10.2 via INTRAVENOUS
  Filled 2021-02-15: qty 11

## 2021-02-16 ENCOUNTER — Telehealth: Payer: Self-pay

## 2021-02-16 NOTE — Telephone Encounter (Signed)
-----   Message from Richardo Priest, MD sent at 02/16/2021  7:39 AM EST ----- This is a good result normal images

## 2021-02-16 NOTE — Telephone Encounter (Signed)
Spoke with patient regarding results and recommendation.  Patient verbalizes understanding and is agreeable to plan of care. Advised patient to call back with any issues or concerns.  

## 2021-02-20 ENCOUNTER — Ambulatory Visit: Payer: BC Managed Care – PPO

## 2021-02-20 ENCOUNTER — Other Ambulatory Visit: Payer: Self-pay

## 2021-02-20 DIAGNOSIS — M5442 Lumbago with sciatica, left side: Secondary | ICD-10-CM

## 2021-02-20 DIAGNOSIS — G8929 Other chronic pain: Secondary | ICD-10-CM | POA: Diagnosis not present

## 2021-02-20 DIAGNOSIS — M6281 Muscle weakness (generalized): Secondary | ICD-10-CM

## 2021-02-20 DIAGNOSIS — M62830 Muscle spasm of back: Secondary | ICD-10-CM | POA: Diagnosis not present

## 2021-02-20 NOTE — Therapy (Signed)
[place], [place], [address] Phone: [phone]   Fax:  [phone]  Physical Therapy Treatment  Patient Details  Name: Gregory Santiago MRN: [id number] Date of Birth: [birth year] Referring Provider (PT): Binnie Rail, MD   Encounter Date: 02/20/2021   PT End of Session - 02/20/21 1003    Visit Number 8    Number of Visits 9    Date for PT Re-Evaluation 03/04/21    Authorization Type [employer] - FOTO visit 10    PT Start Time 1003   pt arrived late   PT Stop Time 1041    PT Time Calculation (min) 38 min    Activity Tolerance Patient tolerated treatment well    Behavior During Therapy [facility] for tasks assessed/performed           Past Medical History:  Diagnosis Date  . Allergic rhinitis   . Allergy    seasonal allergies  . AR (allergic rhinitis) 10/28/2018  . Arthritis    generalized  . Blood transfusion without reported diagnosis 1996  . Coronary artery calcification seen on CAT scan 05/22/2019  . Cranial nerve VII palsy   . Duodenitis   . Ear pressure, right 02/27/2019  . Family history of diabetes mellitus in mother 05/21/2019  . Fundic gland polyps of stomach, benign   . GERD (gastroesophageal reflux disease)    on meds  . Hemorrhoids   . Hepatitis A    as a child  . 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 HLD (hyperlipidemia)    on meds  . Knee pain 04/23/2018  . Oropharyngeal dysphagia 06/02/2019  . 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  . 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 Sensorineural  hearing loss (SNHL) of both ears 06/02/2019  . Stye 05/22/2019    Past Surgical History:  Procedure Laterality Date  . arm surgery Left 1996   left forearm, ligaments, nerve and tendon repair, from accident  . CHEST SURGERY  1996   from accident-wound closure  . EYE SURGERY  11/2018   eye lid repair x 3   . HAND SURGERY Right 1996   middle, ring, and little finger filayed open in accident  . MENISCUS REPAIR Right 2018  . NASAL SEPTUM SURGERY  1979  . SALIVARY GLAND SURGERY Right    Nov 2019  . WISDOM TOOTH EXTRACTION      There were no vitals filed for this visit.   Subjective Assessment - 02/20/21 1003    Subjective Pt reports having a rough weekend with increased groin pain, especially in the morning and at night. He states his groin pain eases with increased movement but takes a while to ease sometimes. His L shin pain occurs randomly but less frequently than before.    Pertinent History See extensive PMH above    Limitations Standing;Sitting    How long can you sit comfortably? Usually no issues but pain does not worsen/ease with sitting if it is already present    How long can you stand comfortably? "Probably 3-4 minutes then it starts giving me a problem"    How long can  you walk comfortably? "I never really paid attention to it. If it's bothering me, it doesn't get better but doesn't get worse"    Diagnostic tests 12/28/2020 Lumbar spine: Straightening of the expected lumbar lordosis, nonspecific though could be seen in the setting of muscle spasm. Severe DDD of L4-L5. Mild-to-moderate DDD within the remainder of the lumbar spine.    Patient Stated Goals I want to work full time comfortably without this pain    Currently in Pain? Yes    Pain Score 3    was a 7-8/10 this morning   Pain Location Hip   hip/groin   Pain Orientation Left    Pain Descriptors / Indicators Stabbing    Pain Type Chronic pain    Pain Onset More than a month ago              [facility] PT Assessment -  02/20/21 0001      Assessment   Medical Diagnosis Left lumbar radiculopathy (M54.16)    Referring Provider (PT) Binnie Rail, MD                         [facility] Adult PT Treatment/Exercise - 02/20/21 0001      Self-Care   Self-Care Other Self-Care Comments    Other Self-Care Comments  See patient education      Lumbar Exercises: Stretches   Hip Flexor Stretch Limitations Thomas stretch LLE x 90 sec    Piriformis Stretch Right;Left;1 rep;Other (comment)   90 seconds   Other Lumbar Stretch Exercise Butterfly stretch x 90 sec      Lumbar Exercises: Aerobic   Nustep L7 x 5 min LE only      Lumbar Exercises: Standing   Functional Squats Limitations Goblet squat with 15# with cues for technique and to avoid knees going past toes - taps to mat as cue    Other Standing Lumbar Exercises Hip hinge with dowel x 15 then deadlift with 45# KB x 15 - cues for form/technique      Knee/Hip Exercises: Prone   Hip Extension Strengthening;2 sets;15 reps;Left      Manual Therapy   Manual Traction LLE LAD with gentle oscillations                  PT Education - 02/20/21 1344    Education Details Reviewed HEP and stretches. Discussed signs/symptoms and subjective report of L groin pain being consistent with potential hip OA but to follow up with physician for further assessment. Advised to sit with knees below hip level and monitor response/any change in hip/groin pain.    Person(s) Educated Patient    Methods Explanation;Demonstration;Tactile cues;Verbal cues    Comprehension Verbalized understanding;Returned demonstration            PT Short Term Goals - 01/30/21 1539      PT SHORT TERM GOAL #1   Title Patient will be independent with initial HEP.    Baseline Patient provided initial HEP during evaluation 01/02/2021.    Time 4    Period Weeks    Status Achieved    Target Date 01/30/21      PT SHORT TERM GOAL #2   Title Patient will be able to perform L SB  without pain in L hip and/or L low back.    Baseline Pt complains of pain in L hip and low back with L trunk/lumbar SB. Minimal pain 2-3/10 during L SB but not terrible pain  Time 4    Period Weeks    Status Partially Met    Target Date 01/30/21      PT SHORT TERM GOAL #3   Title Patient will be able to perform SLS on each leg > 10 seconds without loss of balance.    Baseline Pt able to maintain SLS on each LE for > 10 sec today    Time 4    Period Weeks    Status Achieved    Target Date 01/30/21      PT SHORT TERM GOAL #4   Title Pt will present with no significant innominate rotation upon long sit re-assessment.    Baseline 01/30/2021: anterior rotation of L innominate from supine to longsitting that was no longer apparent following MET    Time 4    Period Weeks    Status On-going    Target Date 01/30/21             PT Long Term Goals - 02/06/21 1104      PT LONG TERM GOAL #1   Title Patient will be independent with advanced HEP.    Baseline Patient provided initial HEP during evaluation 01/02/2021.    Time 8    Period Weeks    Status On-going      PT LONG TERM GOAL #2   Title Patient's FOTO score will improve from 70% functional status to 72% function to demonstrate improved perceived ability.    Baseline 70% function; predicted 72% function. Update: will improve from 70% to 72%    Time 8    Period Weeks    Status Revised      PT LONG TERM GOAL #3   Title Patient will report </= 3/10 pain with no radicular symptoms with standing throughout workweek (at least 5 consecutive days).    Baseline Better tolerance with work activities with minimal pain and "chill in L low back"    Time 8    Period Weeks    Status Partially Met      PT LONG TERM GOAL #4   Title Patient will be able to ascend 10 steps forwards and descend 10 steps backwards with no significant increase in pain to mimic ability to navigate ladder (alternating LE each time pt ascends/descends).    Baseline  Pt reports increased pain and some difficulty navigating ladder at work occasionally    Time 8    Period Weeks    Status On-going                 Plan - 02/20/21 1042    Clinical Impression Statement Patient's L groin pain is worse at night and in the mornings but eases with increased movement in the morning and throughout the day. Patient tolerated session well overall with decrease in L groin pain following LLE long axis distraction. He explains that SLR and sidelying hip ABD (3 x 15) will sometimes aggravate L groin pain and was advised to decrease repetitions/sets and perform hip FL stretch for prolonged time and monitor response. He requires several verbal, visual, and tactile cues during deadlift but was able to perform hip hinge with good technique. He has follow up with his physician on Friday and will return Monday for a re-evaluation to reassess strength and mobility and discuss plan moving forward. He has progressed well overall with ease of LBP, radicular symptoms, and L anterior shin pain. He expresses being more aware of foot placement on step stools and ladders at work  to minimize L anterior shin pain. He may benefit from continued skilled PT to address remaining deficits but will plan to discuss continuing POC vs DC next session.    Personal Factors and Comorbidities Age;Comorbidity 3+;Profession;Time since onset of injury/illness/exacerbation;Past/Current Experience    Comorbidities See extensive PMH above    Examination-Activity Limitations Stand;Stairs;Other    Examination-Participation Restrictions Occupation;Other    PT Treatment/Interventions ADLs/Self Care Home Management;Aquatic Therapy;Cryotherapy;Electrical Stimulation;Iontophoresis 80m/ml Dexamethasone;Moist Heat;Traction;Neuromuscular re-education;Balance training;Therapeutic exercise;Therapeutic activities;Functional mobility training;Stair training;Patient/family education;Manual techniques;Dry needling;Passive range  of motion;Taping    PT Next Visit Plan Re-evaluation - continue POC vs DC. LLE sciatic nerve glides as indicated, manual techniques PRN. Reassess long sit test if indicated. Hip strengthening exercises for increased stability. L hip ABD strength and ADD stretching. Manual along L tibialis anterior and potential TPDN. Lifting and squatting for body mechanics at work    PT Home Exercise Plan [id number] - piriformis stretch, LTR, MET (L hip EXT, R hip FL) using broom, bridge with ball squeeze, hooklying clam (green), supine/seated HS stretch, Thomas stretch, PPT, standing hip ABD, sciatic nerve glides (PRN), adductor stretch (seated and standing), sidelying hip abduction, bird dog, fire hydrant, hip FL stretch (standing or prone), forward/lateral lunge stretches, lateral stepping, monster walks    Consulted and Agree with Plan of Care Patient           Patient will benefit from skilled therapeutic intervention in order to improve the following deficits and impairments:  Decreased activity tolerance,Decreased balance,Decreased mobility,Decreased strength,Decreased endurance,Decreased range of motion,Increased muscle spasms,Pain  Visit Diagnosis: Chronic left-sided low back pain with left-sided sciatica  Muscle spasm of back  Muscle weakness (generalized)     Problem List Patient Active Problem List   Diagnosis Date Noted  . Hepatitis A   . Hemorrhoids   . Fundic gland polyps of stomach, benign   . Duodenitis   . Cranial nerve VII palsy   . Arthritis   . Allergy   . Allergic rhinitis   . Left lumbar radiculopathy 12/28/2020  . Sudden left hearing loss 06/03/2020  . Osteoarthritis 06/03/2020  . Muscle spasms of neck 06/03/2020  . Oropharyngeal dysphagia 06/02/2019  . 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/02/2019  . Coronary artery calcification seen on CAT scan 05/22/2019  . Stye 05/22/2019  . Family history of diabetes mellitus in mother 05/21/2019  . Ear pressure, right  02/27/2019  . 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 11/30/2018  . 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  . 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 AR (allergic rhinitis) 10/28/2018  . 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[place] 10/14/2018  . 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 Knee pain 04/23/2018  . GERD (gastroesophageal reflux disease)   . HLD (hyperlipidemia)   . Blood transfusion without reported diagnosis [place] PT, DPT 02/20/21 1:55 PM  [facility] [address]Phone: [phone]  Fax:  [phone] Name: DRaiquan ChandlerMRN: [id number] of Birth: 103/09/1951

## 2021-02-23 DIAGNOSIS — H04221 Epiphora due to insufficient drainage, right lacrimal gland: Secondary | ICD-10-CM | POA: Diagnosis not present

## 2021-02-23 DIAGNOSIS — H02532 Eyelid retraction right lower eyelid: Secondary | ICD-10-CM | POA: Diagnosis not present

## 2021-02-23 DIAGNOSIS — H02152 Paralytic ectropion of right lower eyelid: Secondary | ICD-10-CM | POA: Diagnosis not present

## 2021-02-23 DIAGNOSIS — H9193 Unspecified hearing loss, bilateral: Secondary | ICD-10-CM

## 2021-02-23 DIAGNOSIS — S0451XS Injury of facial nerve, right side, sequela: Secondary | ICD-10-CM | POA: Diagnosis not present

## 2021-02-23 DIAGNOSIS — K5190 Ulcerative colitis, unspecified, without complications: Secondary | ICD-10-CM

## 2021-02-23 HISTORY — DX: Ulcerative colitis, unspecified, without complications: K51.90

## 2021-02-23 HISTORY — DX: Unspecified hearing loss, bilateral: H91.93

## 2021-02-23 NOTE — Progress Notes (Signed)
Subjective:    Patient ID: Gregory Santiago, male    DOB: 12-30-1950, 70 y.o.   MRN: [id number]  HPI The patient is here for an acute visit.   Back pain - I saw him 1/5 - start end of December - lower back to down left leg.  No N/T.  Referred to PT.  Started gabapentin and meloxicam.     He has done PT and he has had some dry needling ,which helped some.  He still has pain.  He now has deep pain in the front of the left hip and it can be debilitating.  He has a heavy feeling in the groin area.    Still has lower back pain, pain in left groin and pain down left leg.  He has pain with standing.  The groin pain is mostly when sitting and when he first gets up.  Once the pain comes it takes a while to subside.  The pain in the groin improves the more he moves.  The pain radiates down the left leg.  The left leg feels weak at times.   No changes in bowel/bladder.     He takes flexeril, tylenol and advil.      Medications and allergies reviewed with patient and updated if appropriate.  Patient Active Problem List   Diagnosis Date Noted  . Hepatitis A   . Hemorrhoids   . Fundic gland polyps of stomach, benign   . Duodenitis   . Cranial nerve VII palsy   . Arthritis   . Allergy   . Allergic rhinitis   . Left lumbar radiculopathy 12/28/2020  . Sudden left hearing loss 06/03/2020  . Osteoarthritis 06/03/2020  . Muscle spasms of neck 06/03/2020  . Oropharyngeal dysphagia 06/02/2019  . 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/02/2019  . Coronary artery calcification seen on CAT scan 05/22/2019  . Stye 05/22/2019  . Family history of diabetes mellitus in mother 05/21/2019  . Ear pressure, right 02/27/2019  . 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  . 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 AR (allergic rhinitis) 10/28/2018  . Primary malignant neoplasm of parotid gland  ([place]) 10/14/2018  . 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 Knee pain 04/23/2018  . GERD (gastroesophageal reflux disease)   . HLD (hyperlipidemia)   . Blood transfusion without reported diagnosis 1996    Current Outpatient Medications on File Prior to Visit  Medication Sig Dispense Refill  . cyclobenzaprine (FLEXERIL) 5 MG tablet Take 1 tablet (5 mg total) by mouth 3 (three) times daily as needed for muscle spasms. 90 tablet 0  . fluticasone (FLONASE) 50 MCG/ACT nasal spray Place 2 sprays into both nostrils at bedtime.    . gabapentin (NEURONTIN) 100 MG capsule Take 1-2 capsules (100-200 mg total) by mouth at bedtime. 60 capsule 3  . hydrocortisone (ANUSOL-HC) 2.5 % rectal cream Place 1 application rectally 2 (two) times daily as needed for hemorrhoids or itching. Keep on file - does not need filled now 30 g 5  . meloxicam (MOBIC) 15 MG tablet Take 1 tablet (15 mg total) by mouth daily as needed for pain. 90 tablet 3  . Multiple Vitamin (MULTIVITAMIN) tablet Take 1 tablet by mouth daily.    Marland Kitchen omeprazole (PRILOSEC) 40 MG capsule TAKE 1 CAPSULE BY MOUTH DAILY 90 capsule 3  . REPATHA SURECLICK 115 MG/ML SOAJ INJECT 1 DOSE UNDER  THE SKIN EVERY 14 DAYS 6 mL 3  . REPATHA SURECLICK 168 MG/ML SOAJ INJECT 1 DOSE UNDER THE SKIN EVERY 14 DAYS 6 mL 3  . REPATHA SURECLICK 372 MG/ML SOAJ INJECT 1 DOSE UNDER THE SKIN EVERY 14 DAYS 6 mL 3  . rosuvastatin (CRESTOR) 20 MG tablet Take 1 tablet (20 mg total) by mouth daily. 90 tablet 3  . sodium fluoride (FLUORISHIELD) 1.1 % GEL dental gel USE ONCE DAILY AFTER BRUSHING. PLACE IN TRAY FOR 5 MINUTES. DO NOT RINSE OR EAT 30 MINUTES AFTER APPLICATION     No current facility-administered medications on file prior to visit.    Past Medical History:  Diagnosis Date  . Allergic rhinitis   . Allergy    seasonal allergies  . AR (allergic rhinitis) 10/28/2018  . Arthritis    generalized  . Blood transfusion without reported diagnosis 1996  . Coronary  artery calcification seen on CAT scan 05/22/2019  . Cranial nerve VII palsy   . Duodenitis   . Ear pressure, right 02/27/2019  . Family history of diabetes mellitus in mother 05/21/2019  . Fundic gland polyps of stomach, benign   . GERD (gastroesophageal reflux disease)    on meds  . Hemorrhoids   . Hepatitis A    as a child  . 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 HLD (hyperlipidemia)    on meds  . Knee pain 04/23/2018  . Oropharyngeal dysphagia 06/02/2019  . 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  . 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/02/2019  . Stye 05/22/2019    Past Surgical History:  Procedure Laterality Date  . arm surgery Left 1996   left forearm, ligaments, nerve and tendon repair, from accident  . CHEST SURGERY  1996   from accident-wound closure  . EYE SURGERY  11/2018   eye lid repair x 3   . HAND SURGERY Right 1996   middle, ring, and little finger filayed open in accident  . MENISCUS REPAIR Right 2018  . NASAL SEPTUM SURGERY  1979  . SALIVARY GLAND SURGERY Right    Nov 2019  . WISDOM TOOTH EXTRACTION      Social History   Socioeconomic History  . Marital status: Married    Spouse name: Not on file  . Number of children: 3  . Years of education: 1 year of college  . Highest education level: Some college, no degree  Occupational History  . Occupation: Programmer, systems: [employer]  Tobacco Use  . Smoking status: Former Smoker    Packs/day: 0.50    Years: 5.00    Pack years: 2.50    Types:  Cigarettes    Quit date: 12/24/1982    Years since quitting: 38.1  . Smokeless tobacco: Never Used  Vaping Use  . Vaping Use: Never used  Substance and Sexual Activity  . Alcohol use: Yes    Comment: 1 x per month  . Drug use: No  . Sexual activity: Not on file  Other Topics Concern  . Not on file  Social History Narrative  Exercise: not currently running   Lives at home with wife    Right handed   Caffeine: 6 cups daily   Social Determinants of Health   Financial Resource Strain: Not on file  Food Insecurity: Not on file  Transportation Needs: Not on file  Physical Activity: Not on file  Stress: Not on file  Social Connections: Not on file    Family History  Problem Relation Age of Onset  . Prostate cancer Father 74  . Arthritis Father   . Lymphoma Father 27  . Colon polyps Mother 39  . Diabetes Mother   . Parkinson's disease Mother   . Arthritis Mother   . Arthritis Paternal Grandmother   . Heart attack Paternal Grandfather   . Heart disease Paternal Grandfather   . Colon cancer Neg Hx   . Esophageal cancer Neg Hx   . Stomach cancer Neg Hx   . Rectal cancer Neg Hx     Review of Systems Per HPI     Objective:   Vitals:   02/24/21 0745  BP: 112/74  Pulse: 79  Temp: 98.3 F (36.8 C)  SpO2: 96%   BP Readings from Last 3 Encounters:  02/24/21 112/74  01/23/21 122/62  12/28/20 110/82   Wt Readings from Last 3 Encounters:  02/24/21 177 lb (80.3 kg)  02/15/21 182 lb (82.6 kg)  01/23/21 182 lb (82.6 kg)   Body mass index is 24.69 kg/m.   Physical Exam Constitutional:      General: He is not in acute distress.    Appearance: Normal appearance. He is not ill-appearing.  HENT:     Head: Normocephalic and atraumatic.  Musculoskeletal:        General: Tenderness (mild tenderness with palpation lumbar spine, no pain in groin with palpation) present. No deformity.     Right lower leg: No edema.     Left lower leg: No edema.  Skin:    General: Skin  is warm and dry.  Neurological:     Mental Status: He is alert.     Sensory: No sensory deficit.     Motor: No weakness.            Assessment & Plan:    See Problem List for Assessment and Plan of chronic medical problems.    This visit occurred during the SARS-CoV-2 public health emergency.  Safety protocols were in place, including screening questions prior to the visit, additional usage of staff PPE, and extensive cleaning of exam room while observing appropriate contact time as indicated for disinfecting solutions.

## 2021-02-24 ENCOUNTER — Ambulatory Visit (INDEPENDENT_AMBULATORY_CARE_PROVIDER_SITE_OTHER): Payer: BC Managed Care – PPO | Admitting: Internal Medicine

## 2021-02-24 ENCOUNTER — Ambulatory Visit (INDEPENDENT_AMBULATORY_CARE_PROVIDER_SITE_OTHER): Payer: BC Managed Care – PPO

## 2021-02-24 ENCOUNTER — Encounter: Payer: Self-pay | Admitting: Internal Medicine

## 2021-02-24 ENCOUNTER — Other Ambulatory Visit: Payer: Self-pay

## 2021-02-24 VITALS — BP 112/74 | HR 79 | Temp 98.3°F | Ht 71.0 in | Wt 177.0 lb

## 2021-02-24 DIAGNOSIS — I2510 Atherosclerotic heart disease of native coronary artery without angina pectoris: Secondary | ICD-10-CM

## 2021-02-24 DIAGNOSIS — M5416 Radiculopathy, lumbar region: Secondary | ICD-10-CM | POA: Diagnosis not present

## 2021-02-24 DIAGNOSIS — M25552 Pain in left hip: Secondary | ICD-10-CM

## 2021-02-24 NOTE — Patient Instructions (Addendum)
Have an xray downstairs.    An MRI was ordered.      A referral was ordered for murphy wainer and neurosurgery.

## 2021-02-25 DIAGNOSIS — M25552 Pain in left hip: Secondary | ICD-10-CM | POA: Insufficient documentation

## 2021-02-25 HISTORY — DX: Pain in left hip: M25.552

## 2021-02-25 NOTE — Assessment & Plan Note (Signed)
Subacute Reviewed xray from lab visit Will order MRI given duration of symptom and no improvement with PT ? Gabapentin 100 mg HS helping - can try 200-300 mg at night Pain is tolerable - does not need anything stronger Continue advil or tylenol prn Referred to neurosurgery

## 2021-02-25 NOTE — Assessment & Plan Note (Signed)
Acute ? Associated with back pain/radiculopathy vs primary hip issue Will check xray to r/o OA, metastatic disease Likely radiculopathy from back Refer to ortho - [facility] advil or tylenol prn for pain

## 2021-02-27 ENCOUNTER — Ambulatory Visit: Payer: BC Managed Care – PPO | Attending: Internal Medicine

## 2021-02-27 ENCOUNTER — Other Ambulatory Visit: Payer: Self-pay

## 2021-02-27 DIAGNOSIS — G8929 Other chronic pain: Secondary | ICD-10-CM | POA: Diagnosis not present

## 2021-02-27 DIAGNOSIS — M5442 Lumbago with sciatica, left side: Secondary | ICD-10-CM | POA: Insufficient documentation

## 2021-02-27 DIAGNOSIS — M6281 Muscle weakness (generalized): Secondary | ICD-10-CM

## 2021-02-27 DIAGNOSIS — M62830 Muscle spasm of back: Secondary | ICD-10-CM | POA: Diagnosis not present

## 2021-02-27 NOTE — Therapy (Signed)
[place], [place], [address] Phone: [phone]   Fax:  [phone]  Physical Therapy Treatment/Discharge Summary  Patient Details  Name: Gregory Santiago MRN: [id number] Date of Birth: [birth year] Referring Provider (PT): Binnie Rail, MD   Encounter Date: 02/27/2021   PT End of Session - 02/27/21 1050    Visit Number 9    Number of Visits 9    Date for PT Re-Evaluation 03/04/21    Authorization Type [employer] - FOTO visit 10    PT Start Time 1045    PT Stop Time 1129    PT Time Calculation (min) 44 min    Activity Tolerance Patient tolerated treatment well    Behavior During Therapy [facility] for tasks assessed/performed           Past Medical History:  Diagnosis Date  . Allergic rhinitis   . Allergy    seasonal allergies  . AR (allergic rhinitis) 10/28/2018  . Arthritis    generalized  . Blood transfusion without reported diagnosis 1996  . Coronary artery calcification seen on CAT scan 05/22/2019  . Cranial nerve VII palsy   . Duodenitis   . Ear pressure, right 02/27/2019  . Family history of diabetes mellitus in mother 05/21/2019  . Fundic gland polyps of stomach, benign   . GERD (gastroesophageal reflux disease)    on meds  . Hemorrhoids   . Hepatitis A    as a child  . 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 HLD (hyperlipidemia)    on meds  . Knee pain 04/23/2018  . Oropharyngeal dysphagia 06/02/2019  . 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  . 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 Sensorineural  hearing loss (SNHL) of both ears 06/02/2019  . Stye 05/22/2019    Past Surgical History:  Procedure Laterality Date  . arm surgery Left 1996   left forearm, ligaments, nerve and tendon repair, from accident  . CHEST SURGERY  1996   from accident-wound closure  . EYE SURGERY  11/2018   eye lid repair x 3   . HAND SURGERY Right 1996   middle, ring, and little finger filayed open in accident  . MENISCUS REPAIR Right 2018  . NASAL SEPTUM SURGERY  1979  . SALIVARY GLAND SURGERY Right    Nov 2019  . WISDOM TOOTH EXTRACTION      There were no vitals filed for this visit.   Subjective Assessment - 02/27/21 1048    Subjective "I am feeling much better than before. I got an x-ray done of my hip and that came out negative, so that's good. That groin pain has really eased up compared to last week."    Pertinent History See extensive PMH above    Limitations Standing;Sitting    How long can you sit comfortably? Usually no issues but pain does not worsen/ease with sitting if it is already present    How long can you stand comfortably? "Probably 3-4 minutes then it starts giving me a problem"    How long can you walk comfortably? "I never really paid attention to it. If it's bothering  me, it doesn't get better but doesn't get worse"    Diagnostic tests 12/28/2020 Lumbar spine: Straightening of the expected lumbar lordosis, nonspecific though could be seen in the setting of muscle spasm. Severe DDD of L4-L5. Mild-to-moderate DDD within the remainder of the lumbar spine.    Patient Stated Goals I want to work full time comfortably without this pain    Currently in Pain? Yes    Pain Score 3    3/10 this morning but has eased since then   Pain Location Back    Pain Orientation Left    Pain Descriptors / Indicators Aching    Pain Onset More than a month ago              [facility] PT Assessment - 02/27/21 0001      Assessment   Medical Diagnosis Left lumbar radiculopathy (M54.16)    Referring  Provider (PT) Binnie Rail, MD      Observation/Other Assessments   Focus on Therapeutic Outcomes (FOTO)  79% function      AROM   Overall AROM Comments Lumbar AROM and BLE AROM WFL except minimal "tightness" across low back with L lumbar SB      Strength   Overall Strength Comments BLE 5/5 grossly with no pain                         [facility] Adult PT Treatment/Exercise - 02/27/21 0001      Self-Care   Self-Care Other Self-Care Comments    Other Self-Care Comments  See patient education      Lumbar Exercises: Aerobic   Nustep L7 x 5 min LE only      Lumbar Exercises: Standing   Functional Squats 20 reps   20x at free motion then 20x goblet squat (see limitation section)   Functional Squats Limitations Goblet squat with 25# 2 x 10 with cues for technique and to avoid knees going past toes - taps to mat as cue    Other Standing Lumbar Exercises Pallof press 2 x 15 each direction at freemotion with 7# resistance    Other Standing Lumbar Exercises Hip hinge with dowel x 15 then deadlift with 45# KB 2 x 10 - cues for form/technique      Lumbar Exercises: Supine   Bent Knee Raise Limitations alternating marches x 30 (15x each LE)    Dead Bug Limitations dead bug with UE FL and LE EXT - green swiss ball between UE and LE x 30 (15x each UE/LE)    Other Supine Lumbar Exercises Hips and knees 90/90 table top position with BUE isometric resistance against anterior thigh x 20 with low back against mat                  PT Education - 02/27/21 1341    Education Details Updated and reviewed final HEP, reviewed FOTO score and comparison to previous intakes, objective findings    Person(s) Educated Patient    Methods Explanation;Demonstration;Tactile cues;Verbal cues;Handout    Comprehension Verbalized understanding;Returned demonstration            PT Short Term Goals - 02/27/21 1348      PT SHORT TERM GOAL #1   Title Patient will be independent with initial HEP.     Baseline Patient provided initial HEP during evaluation 01/02/2021.    Time 4    Period Weeks    Status Achieved    Target Date 01/30/21  PT SHORT TERM GOAL #2   Title Patient will be able to perform L SB without pain in L hip and/or L low back.    Baseline "tightness" across low back but no signficant pain    Time 4    Period Weeks    Status Achieved    Target Date 01/30/21      PT SHORT TERM GOAL #3   Title Patient will be able to perform SLS on each leg > 10 seconds without loss of balance.    Baseline Pt able to maintain SLS on each LE for > 10 sec today    Time 4    Period Weeks    Status Achieved    Target Date 01/30/21      PT SHORT TERM GOAL #4   Title Pt will present with no significant innominate rotation upon long sit re-assessment.    Baseline no apparent rotation    Time 4    Period Weeks    Status Achieved    Target Date 01/30/21             PT Long Term Goals - 02/27/21 1349      PT LONG TERM GOAL #1   Title Patient will be independent with advanced HEP.    Baseline Patient provided initial HEP during evaluation 01/02/2021.    Time 8    Period Weeks    Status Achieved      PT LONG TERM GOAL #2   Title Patient's FOTO score will improve from 70% functional status to 72% function to demonstrate improved perceived ability.    Baseline 79% function    Time 8    Period Weeks    Status Achieved      PT LONG TERM GOAL #3   Title Patient will report </= 3/10 pain with no radicular symptoms with standing throughout workweek (at least 5 consecutive days).    Baseline "I don't really notice it now. Usually 0-1/10 and sometimes up to 3-4/10 maybe but not very often"    Time 8    Period Weeks    Status Achieved      PT LONG TERM GOAL #4   Title Patient will be able to ascend 10 steps forwards and descend 10 steps backwards with no significant increase in pain to mimic ability to navigate ladder (alternating LE each time pt ascends/descends).     Baseline Has been able to navigate steps and ladder without limitations lately with improved placement of feet on steps    Time 8    Period Weeks    Status Achieved                 Plan - 02/27/21 1051    Clinical Impression Statement Patient tolerated session well with no complaints of increased pain. He has progressed well with onset of radicular symptoms occurring seldomly and easing with completion of HEP. He has met all stated goals and demonstrates strength and AROM WFL without complaints of increased pain. He should continue to do well with continued consistency with HEP independently. Patient's FOTO score reveals improvement from 70% function 02/06/2021 to 79% function this session.    Personal Factors and Comorbidities Age;Comorbidity 3+;Profession;Time since onset of injury/illness/exacerbation;Past/Current Experience    Comorbidities See extensive PMH above    Examination-Activity Limitations Stand;Stairs;Other    Examination-Participation Restrictions Occupation;Other    PT Treatment/Interventions ADLs/Self Care Home Management;Aquatic Therapy;Cryotherapy;Electrical Stimulation;Iontophoresis 74m/ml Dexamethasone;Moist Heat;Traction;Neuromuscular re-education;Balance training;Therapeutic exercise;Therapeutic activities;Functional mobility training;Stair training;Patient/family education;Manual techniques;Dry  needling;Passive range of motion;Taping    PT Next Visit Plan D/C today    PT Home Exercise Plan [id number]    Consulted and Agree with Plan of Care Patient           Patient will benefit from skilled therapeutic intervention in order to improve the following deficits and impairments:  Decreased activity tolerance,Decreased balance,Decreased mobility,Decreased strength,Decreased endurance,Decreased range of motion,Increased muscle spasms,Pain  Visit Diagnosis: Chronic left-sided low back pain with left-sided sciatica  Muscle spasm of back  Muscle weakness  (generalized)     Problem List Patient Active Problem List   Diagnosis Date Noted  . Left hip pain 02/25/2021  . Hepatitis A   . Hemorrhoids   . Fundic gland polyps of stomach, benign   . Duodenitis   . Cranial nerve VII palsy   . Arthritis   . Allergy   . Allergic rhinitis   . Left lumbar radiculopathy 12/28/2020  . Sudden left hearing loss 06/03/2020  . Osteoarthritis 06/03/2020  . Muscle spasms of neck 06/03/2020  . Oropharyngeal dysphagia 06/02/2019  . 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/02/2019  . Coronary artery calcification seen on CAT scan 05/22/2019  . Stye 05/22/2019  . Family history of diabetes mellitus in mother 05/21/2019  . Ear pressure, right 02/27/2019  . 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  . 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 AR (allergic rhinitis) 10/28/2018  . 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 Knee pain 04/23/2018  . GERD (gastroesophageal reflux disease)   . HLD (hyperlipidemia)   . Blood transfusion without reported diagnosis [place]  Visits from Start of Care: 9  Current functional level related to goals / functional outcomes: See above   Remaining deficits: See above   Education / Equipment: See above  Plan: Patient agrees to discharge.  Patient goals were met. Patient is being discharged due to meeting the stated rehab goals.  ?????         Haydee Monica, PT, DPT 02/27/21 1:54 PM  [facility] [address] [place], [place], [address] Phone: [phone]   Fax:  [phone]  Name: Gregory Santiago MRN: [id number] Date of Birth: 01-Aug-1951

## 2021-02-28 DIAGNOSIS — M25552 Pain in left hip: Secondary | ICD-10-CM | POA: Diagnosis not present

## 2021-03-06 DIAGNOSIS — M25552 Pain in left hip: Secondary | ICD-10-CM | POA: Diagnosis not present

## 2021-03-06 DIAGNOSIS — M545 Low back pain, unspecified: Secondary | ICD-10-CM | POA: Diagnosis not present

## 2021-03-07 DIAGNOSIS — M5126 Other intervertebral disc displacement, lumbar region: Secondary | ICD-10-CM | POA: Diagnosis not present

## 2021-03-08 ENCOUNTER — Other Ambulatory Visit: Payer: Self-pay | Admitting: Neurosurgery

## 2021-03-08 DIAGNOSIS — M5126 Other intervertebral disc displacement, lumbar region: Secondary | ICD-10-CM

## 2021-03-10 DIAGNOSIS — Z85818 Personal history of malignant neoplasm of other sites of lip, oral cavity, and pharynx: Secondary | ICD-10-CM

## 2021-03-10 DIAGNOSIS — H90A31 Mixed conductive and sensorineural hearing loss, unilateral, right ear with restricted hearing on the contralateral side: Secondary | ICD-10-CM | POA: Diagnosis not present

## 2021-03-10 DIAGNOSIS — H903 Sensorineural hearing loss, bilateral: Secondary | ICD-10-CM | POA: Diagnosis not present

## 2021-03-10 DIAGNOSIS — H90A22 Sensorineural hearing loss, unilateral, left ear, with restricted hearing on the contralateral side: Secondary | ICD-10-CM | POA: Diagnosis not present

## 2021-03-10 DIAGNOSIS — G510 Bell's palsy: Secondary | ICD-10-CM | POA: Diagnosis not present

## 2021-03-10 HISTORY — DX: Personal history of malignant neoplasm of other sites of lip, oral cavity, and pharynx: Z85.818

## 2021-03-12 ENCOUNTER — Other Ambulatory Visit: Payer: BC Managed Care – PPO

## 2021-03-13 DIAGNOSIS — M25552 Pain in left hip: Secondary | ICD-10-CM | POA: Diagnosis not present

## 2021-03-15 ENCOUNTER — Ambulatory Visit
Admission: RE | Admit: 2021-03-15 | Discharge: 2021-03-15 | Disposition: A | Discharge status: Home / Self Care | Payer: BC Managed Care – PPO | Source: Ambulatory Visit | Attending: Neurosurgery | Admitting: Neurosurgery

## 2021-03-15 DIAGNOSIS — M47816 Spondylosis without myelopathy or radiculopathy, lumbar region: Secondary | ICD-10-CM | POA: Diagnosis not present

## 2021-03-15 DIAGNOSIS — M5126 Other intervertebral disc displacement, lumbar region: Secondary | ICD-10-CM

## 2021-03-15 MED ORDER — IOPAMIDOL (ISOVUE-M 200) INJECTION 41%
1.0000 mL | Freq: Once | INTRAMUSCULAR | Status: AC
  Administered 2021-03-15: 1 mL via EPIDURAL

## 2021-03-15 MED ORDER — METHYLPREDNISOLONE ACETATE 40 MG/ML INJ SUSP (RADIOLOG
120.0000 mg | Freq: Once | INTRAMUSCULAR | Status: AC
  Administered 2021-03-15: 120 mg via EPIDURAL

## 2021-03-15 NOTE — Discharge Instructions (Signed)

## 2021-03-17 DIAGNOSIS — L905 Scar conditions and fibrosis of skin: Secondary | ICD-10-CM

## 2021-03-17 HISTORY — DX: Scar conditions and fibrosis of skin: L90.5

## 2021-03-28 DIAGNOSIS — Z03818 Encounter for observation for suspected exposure to other biological agents ruled out: Secondary | ICD-10-CM | POA: Diagnosis not present

## 2021-03-30 DIAGNOSIS — M5126 Other intervertebral disc displacement, lumbar region: Secondary | ICD-10-CM | POA: Diagnosis not present

## 2021-04-03 DIAGNOSIS — Z03818 Encounter for observation for suspected exposure to other biological agents ruled out: Secondary | ICD-10-CM | POA: Diagnosis not present

## 2021-04-11 DIAGNOSIS — C07 Malignant neoplasm of parotid gland: Secondary | ICD-10-CM | POA: Diagnosis not present

## 2021-04-18 DIAGNOSIS — C07 Malignant neoplasm of parotid gland: Secondary | ICD-10-CM | POA: Diagnosis not present

## 2021-04-18 DIAGNOSIS — Z79899 Other long term (current) drug therapy: Secondary | ICD-10-CM | POA: Diagnosis not present

## 2021-04-18 DIAGNOSIS — K117 Disturbances of salivary secretion: Secondary | ICD-10-CM | POA: Diagnosis not present

## 2021-04-18 DIAGNOSIS — Z08 Encounter for follow-up examination after completed treatment for malignant neoplasm: Secondary | ICD-10-CM | POA: Diagnosis not present

## 2021-04-18 DIAGNOSIS — Z8589 Personal history of malignant neoplasm of other organs and systems: Secondary | ICD-10-CM | POA: Diagnosis not present

## 2021-04-18 DIAGNOSIS — I2510 Atherosclerotic heart disease of native coronary artery without angina pectoris: Secondary | ICD-10-CM | POA: Diagnosis not present

## 2021-04-18 DIAGNOSIS — Z923 Personal history of irradiation: Secondary | ICD-10-CM | POA: Diagnosis not present

## 2021-04-18 DIAGNOSIS — Z9221 Personal history of antineoplastic chemotherapy: Secondary | ICD-10-CM | POA: Diagnosis not present

## 2021-06-13 DIAGNOSIS — G510 Bell's palsy: Secondary | ICD-10-CM | POA: Diagnosis not present

## 2021-06-13 DIAGNOSIS — Z87891 Personal history of nicotine dependence: Secondary | ICD-10-CM | POA: Diagnosis not present

## 2021-06-13 DIAGNOSIS — Z9221 Personal history of antineoplastic chemotherapy: Secondary | ICD-10-CM | POA: Diagnosis not present

## 2021-06-13 DIAGNOSIS — H6121 Impacted cerumen, right ear: Secondary | ICD-10-CM | POA: Diagnosis not present

## 2021-06-13 DIAGNOSIS — H90A22 Sensorineural hearing loss, unilateral, left ear, with restricted hearing on the contralateral side: Secondary | ICD-10-CM | POA: Diagnosis not present

## 2021-06-13 DIAGNOSIS — Z85818 Personal history of malignant neoplasm of other sites of lip, oral cavity, and pharynx: Secondary | ICD-10-CM | POA: Diagnosis not present

## 2021-07-03 DIAGNOSIS — C07 Malignant neoplasm of parotid gland: Secondary | ICD-10-CM | POA: Diagnosis not present

## 2021-07-03 DIAGNOSIS — H02201 Unspecified lagophthalmos right upper eyelid: Secondary | ICD-10-CM | POA: Diagnosis not present

## 2021-07-03 DIAGNOSIS — H02202 Unspecified lagophthalmos right lower eyelid: Secondary | ICD-10-CM | POA: Diagnosis not present

## 2021-07-03 DIAGNOSIS — C089 Malignant neoplasm of major salivary gland, unspecified: Secondary | ICD-10-CM | POA: Diagnosis not present

## 2021-07-03 DIAGNOSIS — R1312 Dysphagia, oropharyngeal phase: Secondary | ICD-10-CM | POA: Diagnosis not present

## 2021-07-03 DIAGNOSIS — H16211 Exposure keratoconjunctivitis, right eye: Secondary | ICD-10-CM | POA: Diagnosis not present

## 2021-07-06 DIAGNOSIS — R030 Elevated blood-pressure reading, without diagnosis of hypertension: Secondary | ICD-10-CM | POA: Diagnosis not present

## 2021-07-06 DIAGNOSIS — M5126 Other intervertebral disc displacement, lumbar region: Secondary | ICD-10-CM | POA: Diagnosis not present

## 2021-07-11 DIAGNOSIS — C07 Malignant neoplasm of parotid gland: Secondary | ICD-10-CM | POA: Diagnosis not present

## 2021-07-13 DIAGNOSIS — R590 Localized enlarged lymph nodes: Secondary | ICD-10-CM | POA: Diagnosis not present

## 2021-07-13 DIAGNOSIS — C770 Secondary and unspecified malignant neoplasm of lymph nodes of head, face and neck: Secondary | ICD-10-CM | POA: Diagnosis not present

## 2021-07-13 DIAGNOSIS — C07 Malignant neoplasm of parotid gland: Secondary | ICD-10-CM | POA: Diagnosis not present

## 2021-07-24 DIAGNOSIS — Z9889 Other specified postprocedural states: Secondary | ICD-10-CM | POA: Diagnosis not present

## 2021-07-24 DIAGNOSIS — R590 Localized enlarged lymph nodes: Secondary | ICD-10-CM | POA: Diagnosis not present

## 2021-07-24 DIAGNOSIS — C07 Malignant neoplasm of parotid gland: Secondary | ICD-10-CM | POA: Diagnosis not present

## 2021-07-25 DIAGNOSIS — M950 Acquired deformity of nose: Secondary | ICD-10-CM | POA: Diagnosis not present

## 2021-07-25 DIAGNOSIS — Z87891 Personal history of nicotine dependence: Secondary | ICD-10-CM | POA: Diagnosis not present

## 2021-07-25 DIAGNOSIS — J3489 Other specified disorders of nose and nasal sinuses: Secondary | ICD-10-CM | POA: Diagnosis not present

## 2021-07-25 DIAGNOSIS — G510 Bell's palsy: Secondary | ICD-10-CM | POA: Diagnosis not present

## 2021-07-25 DIAGNOSIS — L905 Scar conditions and fibrosis of skin: Secondary | ICD-10-CM | POA: Diagnosis not present

## 2021-07-26 DIAGNOSIS — E785 Hyperlipidemia, unspecified: Secondary | ICD-10-CM | POA: Diagnosis not present

## 2021-07-26 DIAGNOSIS — Z01812 Encounter for preprocedural laboratory examination: Secondary | ICD-10-CM | POA: Diagnosis not present

## 2021-07-31 DIAGNOSIS — Z87891 Personal history of nicotine dependence: Secondary | ICD-10-CM | POA: Diagnosis not present

## 2021-07-31 DIAGNOSIS — Z923 Personal history of irradiation: Secondary | ICD-10-CM | POA: Diagnosis not present

## 2021-07-31 DIAGNOSIS — K219 Gastro-esophageal reflux disease without esophagitis: Secondary | ICD-10-CM | POA: Diagnosis not present

## 2021-07-31 DIAGNOSIS — Z85818 Personal history of malignant neoplasm of other sites of lip, oral cavity, and pharynx: Secondary | ICD-10-CM | POA: Diagnosis not present

## 2021-07-31 DIAGNOSIS — C770 Secondary and unspecified malignant neoplasm of lymph nodes of head, face and neck: Secondary | ICD-10-CM | POA: Diagnosis not present

## 2021-07-31 DIAGNOSIS — Z9221 Personal history of antineoplastic chemotherapy: Secondary | ICD-10-CM | POA: Diagnosis not present

## 2021-07-31 DIAGNOSIS — C07 Malignant neoplasm of parotid gland: Secondary | ICD-10-CM | POA: Diagnosis not present

## 2021-08-06 NOTE — Patient Instructions (Addendum)
Blood work was ordered.     Medications changes include :   myrbetriq 25 mg daily for your bladder  Your prescription(s) have been submitted to your pharmacy. Please take as directed and contact our office if you believe you are having problem(s) with the medication(s).   Please followup in 1 year  Health Maintenance, Male Adopting a healthy lifestyle and getting preventive care are important in promoting health and wellness. Ask your health care provider about: The right schedule for you to have regular tests and exams. Things you can do on your own to prevent diseases and keep yourself healthy. What should I know about diet, weight, and exercise? Eat a healthy diet  Eat a diet that includes plenty of vegetables, fruits, low-fat dairy products, and lean protein. Do not eat a lot of foods that are high in solid fats, added sugars, or sodium.  Maintain a healthy weight Body mass index (BMI) is a measurement that can be used to identify possible weight problems. It estimates body fat based on height and weight. Your health care provider can help determine your BMI and help you achieve or maintain ahealthy weight. Get regular exercise Get regular exercise. This is one of the most important things you can do for your health. Most adults should: Exercise for at least 150 minutes each week. The exercise should increase your heart rate and make you sweat (moderate-intensity exercise). Do strengthening exercises at least twice a week. This is in addition to the moderate-intensity exercise. Spend less time sitting. Even light physical activity can be beneficial. Watch cholesterol and blood lipids Have your blood tested for lipids and cholesterol at 69 years of age, then havethis test every 5 years. You may need to have your cholesterol levels checked more often if: Your lipid or cholesterol levels are high. You are older than 69 years of age. You are at high risk for heart disease. What  should I know about cancer screening? Many types of cancers can be detected early and may often be prevented. Depending on your health history and family history, you may need to have cancer screening at various ages. This may include screening for: Colorectal cancer. Prostate cancer. Skin cancer. Lung cancer. What should I know about heart disease, diabetes, and high blood pressure? Blood pressure and heart disease High blood pressure causes heart disease and increases the risk of stroke. This is more likely to develop in people who have high blood pressure readings, are of African descent, or are overweight. Talk with your health care provider about your target blood pressure readings. Have your blood pressure checked: Every 3-5 years if you are 40-48 years of age. Every year if you are 33 years old or older. If you are between the ages of 43 and 49 and are a current or former smoker, ask your health care provider if you should have a one-time screening for abdominal aortic aneurysm (AAA). Diabetes Have regular diabetes screenings. This checks your fasting blood sugar level. Have the screening done: Once every three years after age 71 if you are at a normal weight and have a low risk for diabetes. More often and at a younger age if you are overweight or have a high risk for diabetes. What should I know about preventing infection? Hepatitis B If you have a higher risk for hepatitis B, you should be screened for this virus. Talk with your health care provider to find out if you are at risk forhepatitis B infection. Hepatitis C  Blood testing is recommended for: Everyone born from [birth year] through 1964. Anyone with known risk factors for hepatitis C. Sexually transmitted infections (STIs) You should be screened each year for STIs, including gonorrhea and chlamydia, if: You are sexually active and are younger than 69 years of age. You are older than 69 years of age and your health care provider  tells you that you are at risk for this type of infection. Your sexual activity has changed since you were last screened, and you are at increased risk for chlamydia or gonorrhea. Ask your health care provider if you are at risk. Ask your health care provider about whether you are at high risk for HIV. Your health care provider may recommend a prescription medicine to help prevent HIV infection. If you choose to take medicine to prevent HIV, you should first get tested for HIV. You should then be tested every 3 months for as long as you are taking the medicine. Follow these instructions at home: Lifestyle Do not use any products that contain nicotine or tobacco, such as cigarettes, e-cigarettes, and chewing tobacco. If you need help quitting, ask your health care provider. Do not use street drugs. Do not share needles. Ask your health care provider for help if you need support or information about quitting drugs. Alcohol use Do not drink alcohol if your health care provider tells you not to drink. If you drink alcohol: Limit how much you have to 0-2 drinks a day. Be aware of how much alcohol is in your drink. In the [place] one drink equals one 12 oz bottle of beer (355 mL), one 5 oz glass of wine (148 mL), or one 1 oz glass of hard liquor (44 mL). General instructions Schedule regular health, dental, and eye exams. Stay current with your vaccines. Tell your health care provider if: You often feel depressed. You have ever been abused or do not feel safe at home. Summary Adopting a healthy lifestyle and getting preventive care are important in promoting health and wellness. Follow your health care provider's instructions about healthy diet, exercising, and getting tested or screened for diseases. Follow your health care provider's instructions on monitoring your cholesterol and blood pressure. This information is not intended to replace advice given to you by your health care provider. Make  sure you discuss any questions you have with your healthcare provider. Document Revised: 12/03/2018 Document Reviewed: 12/03/2018 Elsevier Patient Education  2022 [employer].

## 2021-08-06 NOTE — Progress Notes (Addendum)
Subjective:    Patient ID: Gregory Santiago, male    DOB: December 07, 1951, 70 y.o.   MRN: [id number]   This visit occurred during the SARS-CoV-2 public health emergency.  Safety protocols were in place, including screening questions prior to the visit, additional usage of staff PPE, and extensive cleaning of exam room while observing appropriate contact time as indicated for disinfecting solutions.   HPI He is here for a physical exam.   He had surgery one week ago - his parotid cancer recurred.  He had LN resection.  He will likely need chemo and/or radiation.  He is recovering well and goes back to work today.   For a while he has a sudden urge to urinate and if he does not go soon he will leak. Nocutria x 1-2.   No issues with stream and he feels like he is emptying his bladder.      Medications and allergies reviewed with patient and updated if appropriate.  Patient Active Problem List   Diagnosis Date Noted   Left hip pain 02/25/2021   Hemorrhoids    Fundic gland polyps of stomach, benign    Duodenitis    Cranial nerve VII palsy    Arthritis    Allergic rhinitis    Left lumbar radiculopathy 12/28/2020   Sudden left hearing loss 06/03/2020   Osteoarthritis 06/03/2020   Muscle spasms of neck 06/03/2020   Oropharyngeal dysphagia 06/02/2019   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/02/2019   Coronary artery calcification seen on CAT scan 05/22/2019   Family history of diabetes mellitus in mother 05/21/2019   Ear pressure, right 02/27/2019   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     cyclobenzaprine (FLEXERIL) 5 MG tablet Take 1 tablet (5 mg total) by mouth 3 (three) times daily as needed for muscle spasms. 90 tablet 0   fluticasone (FLONASE) 50 MCG/ACT nasal spray Place 2 sprays into both nostrils at bedtime.     gabapentin (NEURONTIN) 100 MG capsule Take 1-2 capsules (100-200 mg total) by mouth at bedtime. 60 capsule 3   hydrocortisone (ANUSOL-HC) 2.5 % rectal cream Place 1 application rectally 2 (two) times daily as needed for hemorrhoids or itching. Keep on file - does not need filled now 30 g 5   meloxicam (MOBIC) 15 MG tablet Take 1 tablet (15 mg total) by mouth daily as needed for pain. 90 tablet 3   Multiple Vitamin (MULTIVITAMIN) tablet Take 1 tablet by mouth daily.     omeprazole (PRILOSEC) 40 MG capsule TAKE 1 CAPSULE BY MOUTH DAILY 90 capsule 3   REPATHA SURECLICK [id number] MG/ML SOAJ INJECT 1 DOSE UNDER THE SKIN EVERY 14 DAYS 6 mL 3   rosuvastatin (CRESTOR) 20 MG tablet Take 1 tablet (20 mg total) by mouth daily. 90 tablet 3   sodium fluoride (FLUORISHIELD) 1.1 % GEL dental gel USE ONCE DAILY AFTER BRUSHING. PLACE IN TRAY FOR 5 MINUTES. DO NOT RINSE OR EAT 30 MINUTES AFTER APPLICATION  No current facility-administered medications on file prior to visit.    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   HLD  (hyperlipidemia)    on meds   Knee pain 04/23/2018   Oropharyngeal dysphagia 06/02/2019   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[place]) 10/14/2018   Added automatically from request for surgery [id number]   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   Surgery 11/07/18 - Dr Azucena Cecil- radical parotidectomy with facial nerve sacrifice, neck dissection and dissection/resection of the nerve through the mastoid and tympanic segments   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/02/2019   Stye 05/22/2019    Past Surgical History:  Procedure Laterality Date   arm surgery Left 1996   left forearm, ligaments, nerve and tendon repair, from accident   [place]   from accident-wound closure   EYE SURGERY  11/2018   eye lid repair x 3    HAND SURGERY Right 1996   middle, ring, and little finger filayed open in accident   MENISCUS REPAIR Right 2018   [place]   SALIVARY GLAND SURGERY Right    Nov 2019   WISDOM TOOTH EXTRACTION      Social History   Socioeconomic History   Marital status: Married    Spouse name: Not on file   Number of children: 3   Years of education: 1 year of college   Highest education level: Some college, no degree  Occupational History   Occupation: Programmer, systems: [employer]  Tobacco Use   Smoking status: Former    Packs/day: 0.50    Years: 5.00    Pack years: 2.50    Types: Cigarettes    Quit date: 12/24/1982    Years since quitting: 38.6   Smokeless tobacco: Never  Vaping Use   Vaping Use: Never used  Substance and Sexual Activity   Alcohol use: Yes    Comment: 1 x per month   Drug use: No   Sexual activity: Not on file  Other Topics Concern   Not on file  Social History Narrative   Exercise: not currently running   Lives at home with wife    Right handed   Caffeine: 6  cups daily   Social Determinants of Health   Financial Resource Strain: Not on file  Food Insecurity: Not on file  Transportation Needs: Not on file  Physical Activity: Not on file  Stress: Not on file  Social Connections: Not on file    Family History  Problem Relation Age of Onset   Prostate cancer Father 44   Arthritis Father    Lymphoma Father 85   Colon polyps Mother 71   Diabetes  Mother    Parkinson's disease Mother    Arthritis Mother    Arthritis Paternal Grandmother    Heart attack Paternal Grandfather    Heart disease Paternal Grandfather    Colon cancer Neg Hx    Esophageal cancer Neg Hx    Stomach cancer Neg Hx    Rectal cancer Neg Hx     Review of Systems  Constitutional:  Negative for chills and fever.  Eyes:  Negative for visual disturbance.  Respiratory:  Negative for cough, shortness of breath and wheezing.   Cardiovascular:  Negative for chest pain, palpitations and leg swelling.  Gastrointestinal:  Negative for abdominal pain, blood in stool, constipation, diarrhea and nausea.       Occ gerd  Genitourinary:  Positive for frequency and urgency. Negative for dysuria and hematuria.  Musculoskeletal:  Positive for back pain. Negative for arthralgias.  Skin:  Negative for color change and rash.  Neurological:  Positive for light-headedness (occ if he gets up quickly). Negative for headaches.  Psychiatric/Behavioral:  Negative for dysphoric mood and sleep disturbance. The patient is not nervous/anxious.       Objective:   Vitals:   08/07/21 0749  BP: 126/80  Pulse: (!) 57  Temp: 98.2 F (36.8 C)  SpO2: 97%   Filed Weights   08/07/21 0749  Weight: 178 lb 6.4 oz (80.9 kg)   Body mass index is 24.88 kg/m.  BP Readings from Last 3 Encounters:  08/07/21 126/80  03/15/21 (!) 120/91  02/24/21 112/74    Wt Readings from Last 3 Encounters:  08/07/21 178 lb 6.4 oz (80.9 kg)  02/24/21 177 lb (80.3 kg)  02/15/21 182 lb (82.6 kg)    Depression  screen [facility] 2/9 08/07/2021 06/03/2020 05/22/2019 04/23/2018  Decreased Interest 0 0 0 0  Down, Depressed, Hopeless 0 0 0 0  PHQ - 2 Score 0 0 0 0  Altered sleeping 1 - - -  Tired, decreased energy 1 - - -  Change in appetite 0 - - -  Feeling bad or failure about yourself  0 - - -  Trouble concentrating 0 - - -  Moving slowly or fidgety/restless 0 - - -  Suicidal thoughts 0 - - -  PHQ-9 Score 2 - - -  Difficult doing work/chores Not difficult at all - - -    GAD 7 : Generalized Anxiety Score 08/07/2021  Nervous, Anxious, on Edge 0  Control/stop worrying 0  Worry too much - different things 0  Trouble relaxing 0  Restless 1  Easily annoyed or irritable 1  Afraid - awful might happen 0  Total GAD 7 Score 2  Anxiety Difficulty Not difficult at all       Physical Exam Constitutional: He appears well-developed and well-nourished. No distress.  HENT:  Head: Normocephalic and atraumatic.  Right Ear: External ear normal.  Left Ear: External ear normal.  Mouth/Throat: Oropharynx is clear and moist.  Normal ear canals and TM b/l  Eyes: Conjunctivae and EOM are normal.  Neck: Neck supple. No tracheal deviation present. No thyromegaly present.  No carotid bruit  Cardiovascular: Normal rate, regular rhythm, normal heart sounds and intact distal pulses.   No murmur heard. Pulmonary/Chest: Effort normal and breath sounds normal. No respiratory distress. He has no wheezes. He has no rales.  Abdominal: Soft. He exhibits no distension. There is no tenderness.  Genitourinary: normal size prostate  w/o nodules  Musculoskeletal: He exhibits no edema.  Lymphadenopathy:   He has  no cervical adenopathy.  Skin: Skin is warm and dry. He is not diaphoretic.  Psychiatric: He has a normal mood and affect. His behavior is normal.         Assessment & Plan:   Physical exam: Screening blood work  ordered Exercise   regular Weight  normal  Substance abuse   none   Screened for depression using  the PHQ 9 scale.  No evidence of depression.   Screened for anxiety using GAD7 Scale.  No evidence of anxiety.   Reviewed recommended immunizations.   Health Maintenance  Topic Date Due   COVID-19 Vaccine (3 - Pfizer risk series) 03/08/2020   PNA vac Low Risk Adult (2 of 2 - PPSV23) 03/10/2020   INFLUENZA VACCINE  07/24/2021   COLONOSCOPY (Pts 45-42yr Insurance coverage will need to be confirmed)  12/22/2027   TETANUS/TDAP  11/21/2030   Hepatitis C Screening  Completed   Zoster Vaccines- Shingrix  Completed   HPV VACCINES  Aged Out     See Problem List for Assessment and Plan of chronic medical problems.

## 2021-08-07 ENCOUNTER — Encounter: Payer: Self-pay | Admitting: Internal Medicine

## 2021-08-07 ENCOUNTER — Other Ambulatory Visit: Payer: Self-pay

## 2021-08-07 ENCOUNTER — Ambulatory Visit (INDEPENDENT_AMBULATORY_CARE_PROVIDER_SITE_OTHER): Payer: BC Managed Care – PPO | Admitting: Internal Medicine

## 2021-08-07 VITALS — BP 126/80 | HR 57 | Temp 98.2°F | Ht 71.0 in | Wt 178.4 lb

## 2021-08-07 DIAGNOSIS — N3289 Other specified disorders of bladder: Secondary | ICD-10-CM

## 2021-08-07 DIAGNOSIS — Z23 Encounter for immunization: Secondary | ICD-10-CM | POA: Diagnosis not present

## 2021-08-07 DIAGNOSIS — I2510 Atherosclerotic heart disease of native coronary artery without angina pectoris: Secondary | ICD-10-CM | POA: Diagnosis not present

## 2021-08-07 DIAGNOSIS — Z1331 Encounter for screening for depression: Secondary | ICD-10-CM | POA: Diagnosis not present

## 2021-08-07 DIAGNOSIS — Z0000 Encounter for general adult medical examination without abnormal findings: Secondary | ICD-10-CM

## 2021-08-07 DIAGNOSIS — M62838 Other muscle spasm: Secondary | ICD-10-CM

## 2021-08-07 DIAGNOSIS — Z125 Encounter for screening for malignant neoplasm of prostate: Secondary | ICD-10-CM

## 2021-08-07 DIAGNOSIS — E7841 Elevated Lipoprotein(a): Secondary | ICD-10-CM

## 2021-08-07 DIAGNOSIS — K219 Gastro-esophageal reflux disease without esophagitis: Secondary | ICD-10-CM

## 2021-08-07 DIAGNOSIS — Z833 Family history of diabetes mellitus: Secondary | ICD-10-CM

## 2021-08-07 DIAGNOSIS — R1312 Dysphagia, oropharyngeal phase: Secondary | ICD-10-CM

## 2021-08-07 HISTORY — DX: Other specified disorders of bladder: N32.89

## 2021-08-07 LAB — LIPID PANEL
Cholesterol: 101 mg/dL (ref 0–200)
HDL: 51.5 mg/dL (ref >39.00)
LDL Cholesterol: 29 mg/dL (ref 0–99)
NonHDL: 49.82
Total CHOL/HDL Ratio: 2
Triglycerides: 103 mg/dL (ref 0.0–149.0)
VLDL: 20.6 mg/dL (ref 0.0–40.0)

## 2021-08-07 LAB — CBC WITH DIFFERENTIAL/PLATELET
Basophils Absolute: 0 10*3/uL (ref 0.0–0.1)
Basophils Relative: 0.7 % (ref 0.0–3.0)
Eosinophils Absolute: 0.1 10*3/uL (ref 0.0–0.7)
Eosinophils Relative: 2.1 % (ref 0.0–5.0)
HCT: 40 % (ref 39.0–52.0)
Hemoglobin: 13.9 g/dL (ref 13.0–17.0)
Lymphocytes Relative: 22.1 % (ref 12.0–46.0)
Lymphs Abs: 1.2 10*3/uL (ref 0.7–4.0)
MCHC: 34.7 g/dL (ref 30.0–36.0)
MCV: 88.7 fl (ref 78.0–100.0)
Monocytes Absolute: 0.6 10*3/uL (ref 0.1–1.0)
Monocytes Relative: 10.6 % (ref 3.0–12.0)
Neutro Abs: 3.4 10*3/uL (ref 1.4–7.7)
Neutrophils Relative %: 64.5 % (ref 43.0–77.0)
Platelets: 210 10*3/uL (ref 150.0–400.0)
RBC: 4.51 Mil/uL (ref 4.22–5.81)
RDW: 12.3 % (ref 11.5–15.5)
WBC: 5.3 10*3/uL (ref 4.0–10.5)

## 2021-08-07 LAB — TSH: TSH: 2.68 u[IU]/mL (ref 0.35–5.50)

## 2021-08-07 LAB — COMPREHENSIVE METABOLIC PANEL
ALT: 16 U/L (ref 0–53)
AST: 16 U/L (ref 0–37)
Albumin: 4.4 g/dL (ref 3.5–5.2)
Alkaline Phosphatase: 55 U/L (ref 39–117)
BUN: 18 mg/dL (ref 6–23)
CO2: 29 mEq/L (ref 19–32)
Calcium: 9.6 mg/dL (ref 8.4–10.5)
Chloride: 105 mEq/L (ref 96–112)
Creatinine, Ser: 0.9 mg/dL (ref 0.40–1.50)
GFR: 87.55 mL/min (ref >60.00)
Glucose, Bld: 101 mg/dL — ABNORMAL HIGH (ref 70–99)
Potassium: 4.5 mEq/L (ref 3.5–5.1)
Sodium: 141 mEq/L (ref 135–145)
Total Bilirubin: 0.5 mg/dL (ref 0.2–1.2)
Total Protein: 6.9 g/dL (ref 6.0–8.3)

## 2021-08-07 LAB — HEMOGLOBIN A1C: Hgb A1c MFr Bld: 5.5 % (ref 4.6–6.5)

## 2021-08-07 MED ORDER — MIRABEGRON ER 25 MG PO TB24: 25.0000 mg | ORAL_TABLET | Freq: Every day | ORAL | 5 refills | Status: DC

## 2021-08-07 MED ORDER — CELECOXIB 200 MG PO CAPS: 200.0000 mg | ORAL_CAPSULE | Freq: Two times a day (BID) | ORAL | 1 refills | Status: DC | PRN

## 2021-08-07 NOTE — Assessment & Plan Note (Signed)
New problem  For a while he has had sudden urge to urinate and incontinence if he does not get there quickly No inc freq, dysuria, not emptying bladder or weak stream Prostate normal size on exam Will try myrbetriq 25 mg daily to see if that helps - he will update me via [facility] Deferred urology referral

## 2021-08-07 NOTE — Assessment & Plan Note (Signed)
Chronic GERD controlled Continue omeprazole 40 mg daily 

## 2021-08-07 NOTE — Assessment & Plan Note (Signed)
Chronic, intermittent Related to surgery for his parotid carcinoma Continue Flexeril 5 mg 3 times daily as needed

## 2021-08-07 NOTE — Assessment & Plan Note (Signed)
Chronic Check CMP, lipid panel Continue Repatha, rosuvastatin 20 mg daily

## 2021-08-07 NOTE — Addendum Note (Signed)
Addended by: Marcina Millard on: 08/07/2021 05:02 PM   Modules accepted: Orders

## 2021-08-07 NOTE — Assessment & Plan Note (Signed)
Chronic No symptoms suggestive of angina Continue Repatha, rosuvastatin 20 mg daily Stressed healthy diet and regular exercise

## 2021-08-07 NOTE — Assessment & Plan Note (Signed)
Family history-increased risk for developing diabetes Check a1c Low sugar / carb diet Stressed regular exercise

## 2021-08-08 LAB — PSA, TOTAL AND FREE
PSA, % Free: UNDETERMINED % (calc) (ref >25)
PSA, Free: <0.1 ng/mL
PSA, Total: <0.1 ng/mL (ref <=4.0)

## 2021-08-09 ENCOUNTER — Telehealth: Payer: Self-pay | Admitting: Internal Medicine

## 2021-08-09 NOTE — Telephone Encounter (Signed)
Left message for patient to call me back at [phone] to schedule Medicare Annual Wellness Visit   No hx of AWV eligible as of 08/25/19  Please schedule at anytime with [facility] Advisor if patient calls the office back.    30 Minutes appointment   Any questions, please call me at [phone]

## 2021-08-18 DIAGNOSIS — C07 Malignant neoplasm of parotid gland: Secondary | ICD-10-CM | POA: Diagnosis not present

## 2021-08-23 DIAGNOSIS — R1312 Dysphagia, oropharyngeal phase: Secondary | ICD-10-CM | POA: Diagnosis not present

## 2021-08-23 DIAGNOSIS — C07 Malignant neoplasm of parotid gland: Secondary | ICD-10-CM | POA: Diagnosis not present

## 2021-08-23 DIAGNOSIS — Z510 Encounter for antineoplastic radiation therapy: Secondary | ICD-10-CM | POA: Diagnosis not present

## 2021-08-23 DIAGNOSIS — Z9889 Other specified postprocedural states: Secondary | ICD-10-CM | POA: Diagnosis not present

## 2021-08-23 DIAGNOSIS — C089 Malignant neoplasm of major salivary gland, unspecified: Secondary | ICD-10-CM | POA: Diagnosis not present

## 2021-08-24 DIAGNOSIS — K117 Disturbances of salivary secretion: Secondary | ICD-10-CM | POA: Diagnosis not present

## 2021-08-24 DIAGNOSIS — C07 Malignant neoplasm of parotid gland: Secondary | ICD-10-CM | POA: Diagnosis not present

## 2021-08-24 DIAGNOSIS — R946 Abnormal results of thyroid function studies: Secondary | ICD-10-CM | POA: Diagnosis not present

## 2021-08-24 DIAGNOSIS — I2510 Atherosclerotic heart disease of native coronary artery without angina pectoris: Secondary | ICD-10-CM | POA: Diagnosis not present

## 2021-08-24 DIAGNOSIS — Z87891 Personal history of nicotine dependence: Secondary | ICD-10-CM | POA: Diagnosis not present

## 2021-08-24 DIAGNOSIS — C089 Malignant neoplasm of major salivary gland, unspecified: Secondary | ICD-10-CM | POA: Diagnosis not present

## 2021-08-25 ENCOUNTER — Ambulatory Visit: Payer: BC Managed Care – PPO | Admitting: Cardiology

## 2021-09-01 DIAGNOSIS — Z510 Encounter for antineoplastic radiation therapy: Secondary | ICD-10-CM | POA: Diagnosis not present

## 2021-09-01 DIAGNOSIS — C07 Malignant neoplasm of parotid gland: Secondary | ICD-10-CM | POA: Diagnosis not present

## 2021-09-03 ENCOUNTER — Encounter: Payer: Self-pay | Admitting: Internal Medicine

## 2021-09-03 DIAGNOSIS — N3281 Overactive bladder: Secondary | ICD-10-CM

## 2021-09-05 DIAGNOSIS — Z5111 Encounter for antineoplastic chemotherapy: Secondary | ICD-10-CM | POA: Diagnosis not present

## 2021-09-05 DIAGNOSIS — C07 Malignant neoplasm of parotid gland: Secondary | ICD-10-CM | POA: Diagnosis not present

## 2021-09-05 DIAGNOSIS — Z87891 Personal history of nicotine dependence: Secondary | ICD-10-CM | POA: Diagnosis not present

## 2021-09-06 DIAGNOSIS — C07 Malignant neoplasm of parotid gland: Secondary | ICD-10-CM | POA: Diagnosis not present

## 2021-09-06 DIAGNOSIS — Z510 Encounter for antineoplastic radiation therapy: Secondary | ICD-10-CM | POA: Diagnosis not present

## 2021-09-07 DIAGNOSIS — Z510 Encounter for antineoplastic radiation therapy: Secondary | ICD-10-CM | POA: Diagnosis not present

## 2021-09-07 DIAGNOSIS — C07 Malignant neoplasm of parotid gland: Secondary | ICD-10-CM | POA: Diagnosis not present

## 2021-09-08 DIAGNOSIS — Z510 Encounter for antineoplastic radiation therapy: Secondary | ICD-10-CM | POA: Diagnosis not present

## 2021-09-08 DIAGNOSIS — C07 Malignant neoplasm of parotid gland: Secondary | ICD-10-CM | POA: Diagnosis not present

## 2021-09-11 DIAGNOSIS — C07 Malignant neoplasm of parotid gland: Secondary | ICD-10-CM | POA: Diagnosis not present

## 2021-09-11 DIAGNOSIS — Z510 Encounter for antineoplastic radiation therapy: Secondary | ICD-10-CM | POA: Diagnosis not present

## 2021-09-12 DIAGNOSIS — Z87891 Personal history of nicotine dependence: Secondary | ICD-10-CM | POA: Diagnosis not present

## 2021-09-12 DIAGNOSIS — Z510 Encounter for antineoplastic radiation therapy: Secondary | ICD-10-CM | POA: Diagnosis not present

## 2021-09-12 DIAGNOSIS — Z79899 Other long term (current) drug therapy: Secondary | ICD-10-CM | POA: Diagnosis not present

## 2021-09-12 DIAGNOSIS — Z5111 Encounter for antineoplastic chemotherapy: Secondary | ICD-10-CM | POA: Diagnosis not present

## 2021-09-12 DIAGNOSIS — C07 Malignant neoplasm of parotid gland: Secondary | ICD-10-CM | POA: Diagnosis not present

## 2021-09-13 DIAGNOSIS — C07 Malignant neoplasm of parotid gland: Secondary | ICD-10-CM | POA: Diagnosis not present

## 2021-09-13 DIAGNOSIS — Z510 Encounter for antineoplastic radiation therapy: Secondary | ICD-10-CM | POA: Diagnosis not present

## 2021-09-14 DIAGNOSIS — Z510 Encounter for antineoplastic radiation therapy: Secondary | ICD-10-CM | POA: Diagnosis not present

## 2021-09-14 DIAGNOSIS — C07 Malignant neoplasm of parotid gland: Secondary | ICD-10-CM | POA: Diagnosis not present

## 2021-09-15 DIAGNOSIS — C07 Malignant neoplasm of parotid gland: Secondary | ICD-10-CM | POA: Diagnosis not present

## 2021-09-15 DIAGNOSIS — Z510 Encounter for antineoplastic radiation therapy: Secondary | ICD-10-CM | POA: Diagnosis not present

## 2021-09-17 ENCOUNTER — Other Ambulatory Visit: Payer: Self-pay | Admitting: Internal Medicine

## 2021-09-17 DIAGNOSIS — M159 Polyosteoarthritis, unspecified: Secondary | ICD-10-CM

## 2021-09-17 DIAGNOSIS — M8949 Other hypertrophic osteoarthropathy, multiple sites: Secondary | ICD-10-CM

## 2021-09-17 NOTE — Progress Notes (Signed)
Cardiology Office Note:    Date:  09/18/2021   ID:  [name], DOB 11-10-1951, MRN [id number]  PCP:  Binnie Rail, MD  Cardiologist:  Shirlee More, MD    Referring MD: Binnie Rail, MD    ASSESSMENT:    1. 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 Coronary artery calcification seen on CAT scan   3. Agatston coronary artery calcium score greater than 400   4. Elevated lipoprotein(a)    PLAN:    In order of problems listed above:  At risk for carotid artery injury duplex ordered Continue current treatment including his combined PCSK9 high intensity statin therapy at this time would not advise further ischemia evaluation with a normal myocardial perfusion image.   Next appointment: 1 year   Medication Adjustments/Labs and Tests Ordered: Current medicines are reviewed at length with the patient today.  Concerns regarding medicines are outlined above.  Orders Placed This Encounter  Procedures   EKG 12-Lead    No orders of the defined types were placed in this encounter.   Chief Complaint  Patient presents with   Follow-up    With hyperlipidemia elevated LP(a) and elevated coronary artery calcium score 94th percentile for age sex and ethnicity.     History of Present Illness:    Gregory Santiago is a 70 y.o. male with a hx of hyperlipidemia with elevated lipoprotein a coronary artery calcification on CT scan and parotid cancer with surgery chemotherapy and XRT last seen 01/23/2021.  He has a severely elevated coronary calcium score 1603 94th percentile with a LP(a) level severely elevated at 505 most recently 213 in response to treatment with PCSK9 inhibitor.  Compliance with diet, lifestyle and medications: Yes  Unfortunately had recurrence of his cancer more surgery chemo and currently is having XRT.  His carotid artery is not feel that he is concerned about injury especially with his severe hyperlipidemia asked about a carotid duplex is appropriate  and will order.  He is doing surprisingly well no chest pain edema shortness of breath maintaining his nutrition.  He underwent a myocardial perfusion image 02/15/2021 which was normal. Study Highlights    The left ventricular ejection fraction is mildly decreased (45-54%). Nuclear stress EF: 54%. There was no ST segment deviation noted during stress. The study is normal. This is a low risk study.   No ischemia or infarction on perfusion images. Normal wall motion.  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   HLD (hyperlipidemia)    on meds   Knee pain 04/23/2018   Oropharyngeal dysphagia 06/02/2019   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[place]) 10/14/2018   Added automatically from request for surgery [id number]   Salivary duct carcinoma right  parotid s/p surgery ([place]) 11/30/2018   Surgery 11/07/18 - Dr Azucena Cecil- radical parotidectomy with facial nerve sacrifice, neck dissection and dissection/resection of the nerve through the mastoid and tympanic segments   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/02/2019   Stye 05/22/2019    Past Surgical History:  Procedure Laterality Date   arm surgery Left 1996   left forearm, ligaments, nerve and tendon repair, from accident   [place]   from accident-wound closure   EYE  SURGERY  11/2018   eye lid repair x 3    HAND SURGERY Right 1996   middle, ring, and little finger filayed open in accident   MENISCUS REPAIR Right 2018   [place] Right    Nov 2019   WISDOM TOOTH EXTRACTION      Current Medications: Current Meds  Medication Sig   celecoxib (CELEBREX) 200 MG capsule Take 1 capsule (200 mg total) by mouth 2 (two) times daily as needed. (Patient taking differently: Take 200 mg by mouth 2 (two) times daily as needed for mild pain.)   cyclobenzaprine (FLEXERIL) 5 MG tablet Take 1 tablet (5 mg total) by mouth 3 (three) times daily as needed for muscle spasms.   fluticasone (FLONASE) 50 MCG/ACT nasal spray Place 2 sprays into both nostrils at bedtime.   hydrocortisone (ANUSOL-HC) 2.5 % rectal cream Place 1 application rectally 2 (two) times daily as needed for hemorrhoids or itching. Keep on file - does not need filled now   Multiple Vitamin (MULTIVITAMIN) tablet Take 1 tablet by mouth daily.   omeprazole (PRILOSEC) 40 MG capsule TAKE 1 CAPSULE DAILY   REPATHA SURECLICK 761 MG/ML SOAJ INJECT 1 DOSE UNDER THE SKIN EVERY 14 DAYS   rosuvastatin (CRESTOR) 20 MG tablet Take 1 tablet (20 mg total) by mouth daily.   sodium fluoride (FLUORISHIELD) 1.1 % GEL dental gel USE ONCE DAILY AFTER BRUSHING. PLACE IN TRAY FOR 5 MINUTES. DO NOT RINSE OR EAT 30 MINUTES AFTER APPLICATION     Allergies:   Patient has no known allergies.   Social History   Socioeconomic History   Marital status: Married    Spouse name: Not on file   Number of children: 3   Years of education: 1 year of college   Highest education level: Some college, no degree  Occupational History   Occupation: Programmer, systems: [employer]  Tobacco Use   Smoking status: Former    Packs/day: 0.50    Years: 5.00    Pack years: 2.50    Types: Cigarettes    Quit date: 12/24/1982    Years since quitting: 38.7   Smokeless tobacco: Never  Vaping Use    Vaping Use: Never used  Substance and Sexual Activity   Alcohol use: Yes    Comment: 1 x per month   Drug use: No   Sexual activity: Not on file  Other Topics Concern   Not on file  Social History Narrative   Exercise: not currently running   Lives at home with wife    Right handed   Caffeine: 6 cups daily   Social Determinants of Health   Financial Resource Strain: Not on file  Food Insecurity: Not on file  Transportation Needs: Not on file  Physical Activity: Not on file  Stress: Not on file  Social Connections: Not on file     Family History: The patient's family history includes Arthritis in  his father, mother, and paternal grandmother; Colon polyps (age of onset: 59) in his mother; Diabetes in his mother; Heart attack in his paternal grandfather; Heart disease in his paternal grandfather; Lymphoma (age of onset: 39) in his father; Parkinson's disease in his mother; Prostate cancer (age of onset: 43) in his father. There is no history of Colon cancer, Esophageal cancer, Stomach cancer, or Rectal cancer. ROS:   Please see the history of present illness.    All other systems reviewed and are negative.  EKGs/Labs/Other Studies Reviewed:    The following studies were reviewed today:  EKG:  EKG ordered today and personally reviewed.  The ekg ordered today demonstrates sinus rhythm left axis deviation otherwise normal  Recent Labs: 08/07/2021: ALT 16; BUN 18; Creatinine, Ser 0.90; Hemoglobin 13.9; Platelets 210.0; Potassium 4.5; Sodium 141; TSH 2.68  Recent Lipid Panel    Component Value Date/Time   CHOL 101 08/07/2021 0834   CHOL 98 (L) 01/23/2021 1640   TRIG 103.0 08/07/2021 0834   HDL 51.50 08/07/2021 0834   HDL 47 01/23/2021 1640   CHOLHDL 2 08/07/2021 0834   VLDL 20.6 08/07/2021 0834   LDLCALC 29 08/07/2021 0834   LDLCALC 32 01/23/2021 1640   LDLDIRECT 155.9 01/29/2014 1028    Physical Exam:    VS:  BP 128/86 (BP Location: Right Arm, Patient Position:  Sitting, Cuff Size: Normal)   Pulse 78   Ht 5\' 11"  (1.803 m)   Wt 183 lb 1.3 oz (83 kg)   SpO2 97%   BMI 25.53 kg/m     Wt Readings from Last 3 Encounters:  09/18/21 183 lb 1.3 oz (83 kg)  08/07/21 178 lb 6.4 oz (80.9 kg)  02/24/21 177 lb (80.3 kg)     GEN:  Well nourished, well developed in no acute distress HEENT: Normal NECK: No JVD; No carotid bruits LYMPHATICS: No lymphadenopathy CARDIAC: RRR, no murmurs, rubs, gallops RESPIRATORY:  Clear to auscultation without rales, wheezing or rhonchi  ABDOMEN: Soft, non-tender, non-distended MUSCULOSKELETAL:  No edema; No deformity  SKIN: Warm and dry NEUROLOGIC:  Alert and oriented x 3 PSYCHIATRIC:  Normal affect    Signed, Shirlee More, MD  09/18/2021 4:27 PM    [facility]

## 2021-09-18 ENCOUNTER — Other Ambulatory Visit: Payer: Self-pay

## 2021-09-18 ENCOUNTER — Encounter: Payer: Self-pay | Admitting: Cardiology

## 2021-09-18 ENCOUNTER — Ambulatory Visit (INDEPENDENT_AMBULATORY_CARE_PROVIDER_SITE_OTHER): Payer: BC Managed Care – PPO | Admitting: Cardiology

## 2021-09-18 VITALS — BP 128/86 | HR 78 | Ht 71.0 in | Wt 183.1 lb

## 2021-09-18 DIAGNOSIS — Z510 Encounter for antineoplastic radiation therapy: Secondary | ICD-10-CM | POA: Diagnosis not present

## 2021-09-18 DIAGNOSIS — E7841 Elevated Lipoprotein(a): Secondary | ICD-10-CM

## 2021-09-18 DIAGNOSIS — C089 Malignant neoplasm of major salivary gland, unspecified: Secondary | ICD-10-CM

## 2021-09-18 DIAGNOSIS — R931 Abnormal findings on diagnostic imaging of heart and coronary circulation: Secondary | ICD-10-CM

## 2021-09-18 DIAGNOSIS — I2510 Atherosclerotic heart disease of native coronary artery without angina pectoris: Secondary | ICD-10-CM | POA: Diagnosis not present

## 2021-09-18 DIAGNOSIS — C07 Malignant neoplasm of parotid gland: Secondary | ICD-10-CM | POA: Diagnosis not present

## 2021-09-18 NOTE — Patient Instructions (Signed)
Medication Instructions:  Your physician recommends that you continue on your current medications as directed. Please refer to the Current Medication list given to you today.  *If you need a refill on your cardiac medications before your next appointment, please call your pharmacy*   Lab Work: None If you have labs (blood work) drawn today and your tests are completely normal, you will receive your results only by: [place] (if you have MyChart) OR A paper copy in the mail If you have any lab test that is abnormal or we need to change your treatment, we will call you to review the results.   Testing/Procedures: Your physician has requested that you have a carotid duplex. This test is an ultrasound of the carotid arteries in your neck. It looks at blood flow through these arteries that supply the brain with blood. Allow one hour for this exam. There are no restrictions or special instructions.    Follow-Up: At [facility], you and your health needs are our priority.  As part of our continuing mission to provide you with exceptional heart care, we have created designated Provider Care Teams.  These Care Teams include your primary Cardiologist (physician) and Advanced Practice Providers (APPs -  Physician Assistants and Nurse Practitioners) who all work together to provide you with the care you need, when you need it.  We recommend signing up for the patient portal called "MyChart".  Sign up information is provided on this After Visit Summary.  MyChart is used to connect with patients for Virtual Visits (Telemedicine).  Patients are able to view lab/test results, encounter notes, upcoming appointments, etc.  Non-urgent messages can be sent to your provider as well.   To learn more about what you can do with MyChart, go to [email or web address].    Your next appointment:   1 year(s)  The format for your next appointment:   In Person  Provider:   Shirlee More, MD   Other  Instructions

## 2021-09-19 DIAGNOSIS — Z87891 Personal history of nicotine dependence: Secondary | ICD-10-CM | POA: Diagnosis not present

## 2021-09-19 DIAGNOSIS — C07 Malignant neoplasm of parotid gland: Secondary | ICD-10-CM | POA: Diagnosis not present

## 2021-09-19 DIAGNOSIS — Z510 Encounter for antineoplastic radiation therapy: Secondary | ICD-10-CM | POA: Diagnosis not present

## 2021-09-19 DIAGNOSIS — Z79899 Other long term (current) drug therapy: Secondary | ICD-10-CM | POA: Diagnosis not present

## 2021-09-19 DIAGNOSIS — Z5111 Encounter for antineoplastic chemotherapy: Secondary | ICD-10-CM | POA: Diagnosis not present

## 2021-09-20 DIAGNOSIS — Z510 Encounter for antineoplastic radiation therapy: Secondary | ICD-10-CM | POA: Diagnosis not present

## 2021-09-20 DIAGNOSIS — C07 Malignant neoplasm of parotid gland: Secondary | ICD-10-CM | POA: Diagnosis not present

## 2021-09-21 DIAGNOSIS — Z510 Encounter for antineoplastic radiation therapy: Secondary | ICD-10-CM | POA: Diagnosis not present

## 2021-09-21 DIAGNOSIS — C07 Malignant neoplasm of parotid gland: Secondary | ICD-10-CM | POA: Diagnosis not present

## 2021-09-22 DIAGNOSIS — Z510 Encounter for antineoplastic radiation therapy: Secondary | ICD-10-CM | POA: Diagnosis not present

## 2021-09-22 DIAGNOSIS — C07 Malignant neoplasm of parotid gland: Secondary | ICD-10-CM | POA: Diagnosis not present

## 2021-09-25 DIAGNOSIS — Z510 Encounter for antineoplastic radiation therapy: Secondary | ICD-10-CM | POA: Diagnosis not present

## 2021-09-25 DIAGNOSIS — R252 Cramp and spasm: Secondary | ICD-10-CM | POA: Diagnosis not present

## 2021-09-25 DIAGNOSIS — C07 Malignant neoplasm of parotid gland: Secondary | ICD-10-CM | POA: Diagnosis not present

## 2021-09-26 DIAGNOSIS — Z5111 Encounter for antineoplastic chemotherapy: Secondary | ICD-10-CM | POA: Diagnosis not present

## 2021-09-26 DIAGNOSIS — Z510 Encounter for antineoplastic radiation therapy: Secondary | ICD-10-CM | POA: Diagnosis not present

## 2021-09-26 DIAGNOSIS — R252 Cramp and spasm: Secondary | ICD-10-CM | POA: Diagnosis not present

## 2021-09-26 DIAGNOSIS — C07 Malignant neoplasm of parotid gland: Secondary | ICD-10-CM | POA: Diagnosis not present

## 2021-09-26 DIAGNOSIS — Z87891 Personal history of nicotine dependence: Secondary | ICD-10-CM | POA: Diagnosis not present

## 2021-09-27 DIAGNOSIS — C07 Malignant neoplasm of parotid gland: Secondary | ICD-10-CM | POA: Diagnosis not present

## 2021-09-27 DIAGNOSIS — Z510 Encounter for antineoplastic radiation therapy: Secondary | ICD-10-CM | POA: Diagnosis not present

## 2021-09-27 DIAGNOSIS — R252 Cramp and spasm: Secondary | ICD-10-CM | POA: Diagnosis not present

## 2021-09-28 DIAGNOSIS — I6522 Occlusion and stenosis of left carotid artery: Secondary | ICD-10-CM | POA: Diagnosis not present

## 2021-09-28 DIAGNOSIS — C07 Malignant neoplasm of parotid gland: Secondary | ICD-10-CM | POA: Diagnosis not present

## 2021-09-28 DIAGNOSIS — R931 Abnormal findings on diagnostic imaging of heart and coronary circulation: Secondary | ICD-10-CM | POA: Diagnosis not present

## 2021-09-28 DIAGNOSIS — I2510 Atherosclerotic heart disease of native coronary artery without angina pectoris: Secondary | ICD-10-CM | POA: Diagnosis not present

## 2021-09-28 DIAGNOSIS — Z510 Encounter for antineoplastic radiation therapy: Secondary | ICD-10-CM | POA: Diagnosis not present

## 2021-09-28 DIAGNOSIS — E7841 Elevated Lipoprotein(a): Secondary | ICD-10-CM | POA: Diagnosis not present

## 2021-09-28 DIAGNOSIS — R252 Cramp and spasm: Secondary | ICD-10-CM | POA: Diagnosis not present

## 2021-09-29 ENCOUNTER — Telehealth: Payer: Self-pay

## 2021-09-29 ENCOUNTER — Other Ambulatory Visit: Payer: Self-pay

## 2021-09-29 ENCOUNTER — Ambulatory Visit (HOSPITAL_BASED_OUTPATIENT_CLINIC_OR_DEPARTMENT_OTHER)
Admission: RE | Admit: 2021-09-29 | Discharge: 2021-09-29 | Disposition: A | Discharge status: Home / Self Care | Payer: BC Managed Care – PPO | Source: Ambulatory Visit | Attending: Cardiology | Admitting: Cardiology

## 2021-09-29 DIAGNOSIS — Z510 Encounter for antineoplastic radiation therapy: Secondary | ICD-10-CM | POA: Diagnosis not present

## 2021-09-29 DIAGNOSIS — R931 Abnormal findings on diagnostic imaging of heart and coronary circulation: Secondary | ICD-10-CM | POA: Diagnosis not present

## 2021-09-29 DIAGNOSIS — E7841 Elevated Lipoprotein(a): Secondary | ICD-10-CM | POA: Insufficient documentation

## 2021-09-29 DIAGNOSIS — I6522 Occlusion and stenosis of left carotid artery: Secondary | ICD-10-CM

## 2021-09-29 DIAGNOSIS — R252 Cramp and spasm: Secondary | ICD-10-CM | POA: Diagnosis not present

## 2021-09-29 DIAGNOSIS — C07 Malignant neoplasm of parotid gland: Secondary | ICD-10-CM | POA: Diagnosis not present

## 2021-09-29 DIAGNOSIS — I2510 Atherosclerotic heart disease of native coronary artery without angina pectoris: Secondary | ICD-10-CM | POA: Insufficient documentation

## 2021-09-29 NOTE — Telephone Encounter (Signed)
-----   Message from Richardo Priest, MD sent at 09/29/2021  4:45 PM EDT ----- Good result the right carotid artery of concern does not have atherosclerosis. Mild on left side

## 2021-09-29 NOTE — Telephone Encounter (Signed)
Left message on patients voicemail to please return our call.   

## 2021-10-02 ENCOUNTER — Telehealth: Payer: Self-pay

## 2021-10-02 DIAGNOSIS — R252 Cramp and spasm: Secondary | ICD-10-CM | POA: Diagnosis not present

## 2021-10-02 DIAGNOSIS — C07 Malignant neoplasm of parotid gland: Secondary | ICD-10-CM | POA: Diagnosis not present

## 2021-10-02 DIAGNOSIS — Z510 Encounter for antineoplastic radiation therapy: Secondary | ICD-10-CM | POA: Diagnosis not present

## 2021-10-02 NOTE — Telephone Encounter (Signed)
-----   Message from Richardo Priest, MD sent at 09/29/2021  4:45 PM EDT ----- Good result the right carotid artery of concern does not have atherosclerosis. Mild on left side

## 2021-10-02 NOTE — Telephone Encounter (Signed)
Spoke with patient regarding results and recommendation.  Patient verbalizes understanding and is agreeable to plan of care. Advised patient to call back with any issues or concerns.  

## 2021-10-02 NOTE — Telephone Encounter (Signed)
Left message on patients voicemail to please return our call.   

## 2021-10-03 DIAGNOSIS — K117 Disturbances of salivary secretion: Secondary | ICD-10-CM | POA: Diagnosis not present

## 2021-10-03 DIAGNOSIS — Z5111 Encounter for antineoplastic chemotherapy: Secondary | ICD-10-CM | POA: Diagnosis not present

## 2021-10-03 DIAGNOSIS — Z87891 Personal history of nicotine dependence: Secondary | ICD-10-CM | POA: Diagnosis not present

## 2021-10-03 DIAGNOSIS — Z923 Personal history of irradiation: Secondary | ICD-10-CM | POA: Diagnosis not present

## 2021-10-03 DIAGNOSIS — K30 Functional dyspepsia: Secondary | ICD-10-CM | POA: Diagnosis not present

## 2021-10-03 DIAGNOSIS — Z510 Encounter for antineoplastic radiation therapy: Secondary | ICD-10-CM | POA: Diagnosis not present

## 2021-10-03 DIAGNOSIS — R252 Cramp and spasm: Secondary | ICD-10-CM | POA: Diagnosis not present

## 2021-10-03 DIAGNOSIS — C07 Malignant neoplasm of parotid gland: Secondary | ICD-10-CM | POA: Diagnosis not present

## 2021-10-03 DIAGNOSIS — I2510 Atherosclerotic heart disease of native coronary artery without angina pectoris: Secondary | ICD-10-CM | POA: Diagnosis not present

## 2021-10-03 DIAGNOSIS — R110 Nausea: Secondary | ICD-10-CM | POA: Diagnosis not present

## 2021-10-03 DIAGNOSIS — C770 Secondary and unspecified malignant neoplasm of lymph nodes of head, face and neck: Secondary | ICD-10-CM | POA: Diagnosis not present

## 2021-10-04 DIAGNOSIS — C07 Malignant neoplasm of parotid gland: Secondary | ICD-10-CM | POA: Diagnosis not present

## 2021-10-04 DIAGNOSIS — Z510 Encounter for antineoplastic radiation therapy: Secondary | ICD-10-CM | POA: Diagnosis not present

## 2021-10-04 DIAGNOSIS — R252 Cramp and spasm: Secondary | ICD-10-CM | POA: Diagnosis not present

## 2021-10-05 DIAGNOSIS — R252 Cramp and spasm: Secondary | ICD-10-CM | POA: Diagnosis not present

## 2021-10-05 DIAGNOSIS — Z510 Encounter for antineoplastic radiation therapy: Secondary | ICD-10-CM | POA: Diagnosis not present

## 2021-10-05 DIAGNOSIS — C07 Malignant neoplasm of parotid gland: Secondary | ICD-10-CM | POA: Diagnosis not present

## 2021-10-06 DIAGNOSIS — C07 Malignant neoplasm of parotid gland: Secondary | ICD-10-CM | POA: Diagnosis not present

## 2021-10-06 DIAGNOSIS — R252 Cramp and spasm: Secondary | ICD-10-CM | POA: Diagnosis not present

## 2021-10-06 DIAGNOSIS — Z510 Encounter for antineoplastic radiation therapy: Secondary | ICD-10-CM | POA: Diagnosis not present

## 2021-10-09 DIAGNOSIS — C07 Malignant neoplasm of parotid gland: Secondary | ICD-10-CM | POA: Diagnosis not present

## 2021-10-09 DIAGNOSIS — Z510 Encounter for antineoplastic radiation therapy: Secondary | ICD-10-CM | POA: Diagnosis not present

## 2021-10-09 DIAGNOSIS — R252 Cramp and spasm: Secondary | ICD-10-CM | POA: Diagnosis not present

## 2021-10-10 DIAGNOSIS — K1230 Oral mucositis (ulcerative), unspecified: Secondary | ICD-10-CM | POA: Diagnosis not present

## 2021-10-10 DIAGNOSIS — R252 Cramp and spasm: Secondary | ICD-10-CM | POA: Diagnosis not present

## 2021-10-10 DIAGNOSIS — K219 Gastro-esophageal reflux disease without esophagitis: Secondary | ICD-10-CM | POA: Diagnosis not present

## 2021-10-10 DIAGNOSIS — Z510 Encounter for antineoplastic radiation therapy: Secondary | ICD-10-CM | POA: Diagnosis not present

## 2021-10-10 DIAGNOSIS — Z87891 Personal history of nicotine dependence: Secondary | ICD-10-CM | POA: Diagnosis not present

## 2021-10-10 DIAGNOSIS — C07 Malignant neoplasm of parotid gland: Secondary | ICD-10-CM | POA: Diagnosis not present

## 2021-10-10 DIAGNOSIS — Z79899 Other long term (current) drug therapy: Secondary | ICD-10-CM | POA: Diagnosis not present

## 2021-10-10 DIAGNOSIS — C770 Secondary and unspecified malignant neoplasm of lymph nodes of head, face and neck: Secondary | ICD-10-CM | POA: Diagnosis not present

## 2021-10-10 DIAGNOSIS — K117 Disturbances of salivary secretion: Secondary | ICD-10-CM | POA: Diagnosis not present

## 2021-10-10 DIAGNOSIS — J029 Acute pharyngitis, unspecified: Secondary | ICD-10-CM | POA: Diagnosis not present

## 2021-10-10 DIAGNOSIS — Z923 Personal history of irradiation: Secondary | ICD-10-CM | POA: Diagnosis not present

## 2021-10-10 DIAGNOSIS — Z9221 Personal history of antineoplastic chemotherapy: Secondary | ICD-10-CM | POA: Diagnosis not present

## 2021-10-10 DIAGNOSIS — I2510 Atherosclerotic heart disease of native coronary artery without angina pectoris: Secondary | ICD-10-CM | POA: Diagnosis not present

## 2021-10-10 DIAGNOSIS — Z5111 Encounter for antineoplastic chemotherapy: Secondary | ICD-10-CM | POA: Diagnosis not present

## 2021-10-11 DIAGNOSIS — C07 Malignant neoplasm of parotid gland: Secondary | ICD-10-CM | POA: Diagnosis not present

## 2021-10-11 DIAGNOSIS — N3941 Urge incontinence: Secondary | ICD-10-CM | POA: Diagnosis not present

## 2021-10-11 DIAGNOSIS — Z510 Encounter for antineoplastic radiation therapy: Secondary | ICD-10-CM | POA: Diagnosis not present

## 2021-10-11 DIAGNOSIS — R3914 Feeling of incomplete bladder emptying: Secondary | ICD-10-CM | POA: Diagnosis not present

## 2021-10-11 DIAGNOSIS — R252 Cramp and spasm: Secondary | ICD-10-CM | POA: Diagnosis not present

## 2021-10-11 DIAGNOSIS — R351 Nocturia: Secondary | ICD-10-CM | POA: Diagnosis not present

## 2021-10-11 DIAGNOSIS — R3915 Urgency of urination: Secondary | ICD-10-CM | POA: Diagnosis not present

## 2021-10-12 DIAGNOSIS — Z510 Encounter for antineoplastic radiation therapy: Secondary | ICD-10-CM | POA: Diagnosis not present

## 2021-10-12 DIAGNOSIS — R252 Cramp and spasm: Secondary | ICD-10-CM | POA: Diagnosis not present

## 2021-10-12 DIAGNOSIS — C07 Malignant neoplasm of parotid gland: Secondary | ICD-10-CM | POA: Diagnosis not present

## 2021-10-13 DIAGNOSIS — Z510 Encounter for antineoplastic radiation therapy: Secondary | ICD-10-CM | POA: Diagnosis not present

## 2021-10-13 DIAGNOSIS — R252 Cramp and spasm: Secondary | ICD-10-CM | POA: Diagnosis not present

## 2021-10-13 DIAGNOSIS — C07 Malignant neoplasm of parotid gland: Secondary | ICD-10-CM | POA: Diagnosis not present

## 2021-10-16 DIAGNOSIS — Z510 Encounter for antineoplastic radiation therapy: Secondary | ICD-10-CM | POA: Diagnosis not present

## 2021-10-16 DIAGNOSIS — R252 Cramp and spasm: Secondary | ICD-10-CM | POA: Diagnosis not present

## 2021-10-16 DIAGNOSIS — C07 Malignant neoplasm of parotid gland: Secondary | ICD-10-CM | POA: Diagnosis not present

## 2021-10-17 DIAGNOSIS — R252 Cramp and spasm: Secondary | ICD-10-CM | POA: Diagnosis not present

## 2021-10-17 DIAGNOSIS — Z510 Encounter for antineoplastic radiation therapy: Secondary | ICD-10-CM | POA: Diagnosis not present

## 2021-10-17 DIAGNOSIS — K117 Disturbances of salivary secretion: Secondary | ICD-10-CM | POA: Diagnosis not present

## 2021-10-17 DIAGNOSIS — Z5111 Encounter for antineoplastic chemotherapy: Secondary | ICD-10-CM | POA: Diagnosis not present

## 2021-10-17 DIAGNOSIS — I2510 Atherosclerotic heart disease of native coronary artery without angina pectoris: Secondary | ICD-10-CM | POA: Diagnosis not present

## 2021-10-17 DIAGNOSIS — K5900 Constipation, unspecified: Secondary | ICD-10-CM | POA: Diagnosis not present

## 2021-10-17 DIAGNOSIS — Z87891 Personal history of nicotine dependence: Secondary | ICD-10-CM | POA: Diagnosis not present

## 2021-10-17 DIAGNOSIS — Z79899 Other long term (current) drug therapy: Secondary | ICD-10-CM | POA: Diagnosis not present

## 2021-10-17 DIAGNOSIS — K219 Gastro-esophageal reflux disease without esophagitis: Secondary | ICD-10-CM | POA: Diagnosis not present

## 2021-10-17 DIAGNOSIS — C07 Malignant neoplasm of parotid gland: Secondary | ICD-10-CM | POA: Diagnosis not present

## 2021-10-19 DIAGNOSIS — H02103 Unspecified ectropion of right eye, unspecified eyelid: Secondary | ICD-10-CM | POA: Diagnosis not present

## 2021-10-23 DIAGNOSIS — H02152 Paralytic ectropion of right lower eyelid: Secondary | ICD-10-CM | POA: Diagnosis not present

## 2021-11-01 ENCOUNTER — Other Ambulatory Visit: Payer: Self-pay | Admitting: Internal Medicine

## 2021-11-27 ENCOUNTER — Other Ambulatory Visit: Payer: Self-pay | Admitting: Student

## 2021-11-27 DIAGNOSIS — M5126 Other intervertebral disc displacement, lumbar region: Secondary | ICD-10-CM

## 2021-11-28 NOTE — Discharge Instructions (Signed)

## 2021-11-29 ENCOUNTER — Ambulatory Visit
Admission: RE | Admit: 2021-11-29 | Discharge: 2021-11-29 | Disposition: A | Discharge status: Home / Self Care | Payer: BC Managed Care – PPO | Source: Ambulatory Visit | Attending: Student | Admitting: Student

## 2021-11-29 ENCOUNTER — Other Ambulatory Visit: Payer: Self-pay

## 2021-11-29 DIAGNOSIS — M5126 Other intervertebral disc displacement, lumbar region: Secondary | ICD-10-CM

## 2021-11-29 MED ORDER — METHYLPREDNISOLONE ACETATE 40 MG/ML INJ SUSP (RADIOLOG
80.0000 mg | Freq: Once | INTRAMUSCULAR | Status: AC
  Administered 2021-11-29: 80 mg via EPIDURAL

## 2021-11-29 MED ORDER — IOPAMIDOL (ISOVUE-M 200) INJECTION 41%
1.0000 mL | Freq: Once | INTRAMUSCULAR | Status: AC
  Administered 2021-11-29: 1 mL via EPIDURAL

## 2022-01-29 ENCOUNTER — Telehealth: Payer: Self-pay

## 2022-01-29 NOTE — Telephone Encounter (Signed)
PA on CMM for Repatha 140 mg approved from 01/24/22 until 01/23/23.

## 2022-02-09 ENCOUNTER — Other Ambulatory Visit: Payer: Self-pay

## 2022-02-09 MED ORDER — REPATHA SURECLICK 140 MG/ML ~~LOC~~ SOAJ: SUBCUTANEOUS | 3 refills | Status: DC

## 2022-02-20 ENCOUNTER — Other Ambulatory Visit: Payer: Self-pay

## 2022-02-20 MED ORDER — ROSUVASTATIN CALCIUM 20 MG PO TABS: 20.0000 mg | ORAL_TABLET | Freq: Every day | ORAL | 1 refills | Status: DC

## 2022-06-12 ENCOUNTER — Other Ambulatory Visit: Payer: Self-pay | Admitting: Student

## 2022-06-12 DIAGNOSIS — M5126 Other intervertebral disc displacement, lumbar region: Secondary | ICD-10-CM

## 2022-07-03 ENCOUNTER — Telehealth: Payer: Self-pay | Admitting: Cardiology

## 2022-07-03 NOTE — Telephone Encounter (Signed)
Patient states he thinks he is supposed to have another carotid doppler, but there is an order in yet.

## 2022-07-04 NOTE — Telephone Encounter (Signed)
Called patient and informed him that I did not see an order where Dr. Bettina Gavia wanted him to have another carotid doppler and that I would follow up with Dr. Bettina Gavia when he returns tomorrow. Patient was agreeable with this plan and had no further questions at this time.

## 2022-07-05 ENCOUNTER — Ambulatory Visit
Admission: RE | Admit: 2022-07-05 | Discharge: 2022-07-05 | Disposition: A | Discharge status: Home / Self Care | Payer: BC Managed Care – PPO | Source: Ambulatory Visit | Attending: Student | Admitting: Student

## 2022-07-05 ENCOUNTER — Other Ambulatory Visit: Payer: BC Managed Care – PPO

## 2022-07-05 DIAGNOSIS — M5126 Other intervertebral disc displacement, lumbar region: Secondary | ICD-10-CM

## 2022-07-05 MED ORDER — IOPAMIDOL (ISOVUE-M 300) INJECTION 61%
1.0000 mL | Freq: Once | INTRAMUSCULAR | Status: AC | PRN
  Administered 2022-07-05: 1 mL via EPIDURAL

## 2022-07-05 MED ORDER — METHYLPREDNISOLONE ACETATE 40 MG/ML INJ SUSP (RADIOLOG
80.0000 mg | Freq: Once | INTRAMUSCULAR | Status: AC
  Administered 2022-07-05: 80 mg via EPIDURAL

## 2022-07-05 NOTE — Discharge Instructions (Signed)

## 2022-07-10 ENCOUNTER — Other Ambulatory Visit: Payer: Self-pay

## 2022-07-10 DIAGNOSIS — I2510 Atherosclerotic heart disease of native coronary artery without angina pectoris: Secondary | ICD-10-CM

## 2022-07-10 NOTE — Telephone Encounter (Signed)
Called patient and informed him of Dr. Joya Gaskins response that he wanted him to have an carotid ultrasound. Patient was agreeable with this plan and an order was placed in Epic. Patient had no further questions at this time.

## 2022-07-21 ENCOUNTER — Other Ambulatory Visit: Payer: Self-pay | Admitting: Cardiology

## 2022-07-22 ENCOUNTER — Encounter: Payer: Self-pay | Admitting: Internal Medicine

## 2022-07-22 NOTE — Progress Notes (Unsigned)
Subjective:    Patient ID: Gregory Santiago, male    DOB: 02-06-1951, 71 y.o.   MRN: [id number]     HPI Gregory Santiago is here for follow up of his chronic medical problems, including GERD, OA ( hgeneralized), hld,  CAD  Most recent MRI showed no recurrence of cancer.   He is still working - he is active.    Uses celebrex as needed - does not help much.  Meloxicam helps more.   Medications and allergies reviewed with patient and updated if appropriate.  Current Outpatient Medications on File Prior to Visit  Medication Sig Dispense Refill   celecoxib (CELEBREX) 200 MG capsule Take 1 capsule (200 mg total) by mouth 2 (two) times daily as needed. (Patient taking differently: Take 200 mg by mouth 2 (two) times daily as needed for mild pain.) 180 capsule 1   cyclobenzaprine (FLEXERIL) 5 MG tablet TAKE 1 TABLET THREE TIMES A DAY AS NEEDED FOR MUSCLE SPASMS 90 tablet 11   Evolocumab (REPATHA SURECLICK) 734 MG/ML SOAJ INJECT 1 DOSE UNDER THE SKIN EVERY 14 DAYS 6 mL 3   fluticasone (FLONASE) 50 MCG/ACT nasal spray Place 2 sprays into both nostrils at bedtime.     hydrocortisone (ANUSOL-HC) 2.5 % rectal cream Place 1 application rectally 2 (two) times daily as needed for hemorrhoids or itching. Keep on file - does not need filled now 30 g 5   Multiple Vitamin (MULTIVITAMIN) tablet Take 1 tablet by mouth daily.     omeprazole (PRILOSEC) 40 MG capsule TAKE 1 CAPSULE DAILY 90 capsule 3   rosuvastatin (CRESTOR) 20 MG tablet Take 1 tablet (20 mg total) by mouth daily. 90 tablet 1   silodosin (RAPAFLO) 8 MG CAPS capsule Take 8 mg by mouth daily.     sodium fluoride (FLUORISHIELD) 1.1 % GEL dental gel USE ONCE DAILY AFTER BRUSHING. PLACE IN TRAY FOR 5 MINUTES. DO NOT RINSE OR EAT 30 MINUTES AFTER APPLICATION     No current facility-administered medications on file prior to visit.     Review of Systems  Constitutional:  Negative for chills and fever.  Respiratory:  Negative for cough, shortness of  breath and wheezing.   Cardiovascular:  Negative for chest pain, palpitations and leg swelling.  Neurological:  Positive for light-headedness (occ when he gets up or bends over). Negative for headaches.       Objective:   Vitals:   07/24/22 0746  BP: 112/78  Pulse: 75  Temp: 98 F (36.7 C)  SpO2: 99%   BP Readings from Last 3 Encounters:  07/24/22 112/78  07/05/22 (!) 131/93  11/29/21 131/88   Wt Readings from Last 3 Encounters:  07/24/22 175 lb (79.4 kg)  09/18/21 183 lb 1.3 oz (83 kg)  08/07/21 178 lb 6.4 oz (80.9 kg)   Body mass index is 24.41 kg/m.    Physical Exam Constitutional:      General: He is not in acute distress.    Appearance: Normal appearance. He is not ill-appearing.  HENT:     Head: Normocephalic and atraumatic.  Eyes:     Conjunctiva/sclera: Conjunctivae normal.  Cardiovascular:     Rate and Rhythm: Normal rate and regular rhythm.     Heart sounds: Normal heart sounds. No murmur heard. Pulmonary:     Effort: Pulmonary effort is normal. No respiratory distress.     Breath sounds: Normal breath sounds. No wheezing or rales.  Abdominal:     General: There is no  distension.     Palpations: Abdomen is soft.     Tenderness: There is no abdominal tenderness.     Hernia: No hernia is present.  Musculoskeletal:     Right lower leg: No edema.     Left lower leg: No edema.  Skin:    General: Skin is warm and dry.     Findings: No rash.  Neurological:     Mental Status: He is alert. Mental status is at baseline.  Psychiatric:        Mood and Affect: Mood normal.        Lab Results  Component Value Date   WBC 5.3 08/07/2021   HGB 13.9 08/07/2021   HCT 40.0 08/07/2021   PLT 210.0 08/07/2021   GLUCOSE 101 (H) 08/07/2021   CHOL 101 08/07/2021   TRIG 103.0 08/07/2021   HDL 51.50 08/07/2021   LDLDIRECT 155.9 01/29/2014   LDLCALC 29 08/07/2021   ALT 16 08/07/2021   AST 16 08/07/2021   NA 141 08/07/2021   K 4.5 08/07/2021   CL 105  08/07/2021   CREATININE 0.90 08/07/2021   BUN 18 08/07/2021   CO2 29 08/07/2021   TSH 2.68 08/07/2021   PSA 0.02 (L) 06/03/2020   HGBA1C 5.5 08/07/2021     Assessment & Plan:    See Problem List for Assessment and Plan of chronic medical problems.

## 2022-07-24 ENCOUNTER — Other Ambulatory Visit: Payer: Self-pay

## 2022-07-24 ENCOUNTER — Observation Stay (HOSPITAL_COMMUNITY)
Admission: EM | Admit: 2022-07-24 | Discharge: 2022-07-25 | Disposition: A | Discharge status: Home / Self Care | Payer: Worker's Compensation | Attending: Internal Medicine | Admitting: Internal Medicine

## 2022-07-24 ENCOUNTER — Encounter (HOSPITAL_COMMUNITY): Payer: Self-pay

## 2022-07-24 ENCOUNTER — Emergency Department (HOSPITAL_COMMUNITY): Payer: Worker's Compensation

## 2022-07-24 ENCOUNTER — Ambulatory Visit (INDEPENDENT_AMBULATORY_CARE_PROVIDER_SITE_OTHER): Payer: BC Managed Care – PPO | Admitting: Internal Medicine

## 2022-07-24 VITALS — BP 112/78 | HR 75 | Temp 98.0°F | Ht 71.0 in | Wt 175.0 lb

## 2022-07-24 DIAGNOSIS — T754XXA Electrocution, initial encounter: Secondary | ICD-10-CM

## 2022-07-24 DIAGNOSIS — Z79899 Other long term (current) drug therapy: Secondary | ICD-10-CM | POA: Diagnosis not present

## 2022-07-24 DIAGNOSIS — I493 Ventricular premature depolarization: Secondary | ICD-10-CM | POA: Diagnosis not present

## 2022-07-24 DIAGNOSIS — E7841 Elevated Lipoprotein(a): Secondary | ICD-10-CM

## 2022-07-24 DIAGNOSIS — Z87891 Personal history of nicotine dependence: Secondary | ICD-10-CM | POA: Insufficient documentation

## 2022-07-24 DIAGNOSIS — Z85858 Personal history of malignant neoplasm of other endocrine glands: Secondary | ICD-10-CM | POA: Diagnosis not present

## 2022-07-24 DIAGNOSIS — M65342 Trigger finger, left ring finger: Secondary | ICD-10-CM

## 2022-07-24 DIAGNOSIS — K219 Gastro-esophageal reflux disease without esophagitis: Secondary | ICD-10-CM

## 2022-07-24 DIAGNOSIS — M159 Polyosteoarthritis, unspecified: Secondary | ICD-10-CM

## 2022-07-24 DIAGNOSIS — M62838 Other muscle spasm: Secondary | ICD-10-CM

## 2022-07-24 DIAGNOSIS — I2510 Atherosclerotic heart disease of native coronary artery without angina pectoris: Secondary | ICD-10-CM

## 2022-07-24 DIAGNOSIS — Z833 Family history of diabetes mellitus: Secondary | ICD-10-CM

## 2022-07-24 HISTORY — DX: Electrocution, initial encounter: T75.4XXA

## 2022-07-24 LAB — CK: Total CK: 174 U/L (ref 49–397)

## 2022-07-24 LAB — HEPATIC FUNCTION PANEL
ALT: 19 U/L (ref 0–44)
AST: 24 U/L (ref 15–41)
Albumin: 4.3 g/dL (ref 3.5–5.0)
Alkaline Phosphatase: 57 U/L (ref 38–126)
Bilirubin, Direct: 0.2 mg/dL (ref 0.0–0.2)
Indirect Bilirubin: 0.4 mg/dL (ref 0.3–0.9)
Total Bilirubin: 0.6 mg/dL (ref 0.3–1.2)
Total Protein: 6.7 g/dL (ref 6.5–8.1)

## 2022-07-24 LAB — BASIC METABOLIC PANEL
Anion gap: 5 (ref 5–15)
BUN: 12 mg/dL (ref 8–23)
CO2: 27 mmol/L (ref 22–32)
Calcium: 9.2 mg/dL (ref 8.9–10.3)
Chloride: 106 mmol/L (ref 98–111)
Creatinine, Ser: 1 mg/dL (ref 0.61–1.24)
GFR, Estimated: >60 mL/min (ref >60)
Glucose, Bld: 109 mg/dL — ABNORMAL HIGH (ref 70–99)
Potassium: 3.7 mmol/L (ref 3.5–5.1)
Sodium: 138 mmol/L (ref 135–145)

## 2022-07-24 LAB — CBC
HCT: 39.2 % (ref 39.0–52.0)
Hemoglobin: 13.7 g/dL (ref 13.0–17.0)
MCH: 31.4 pg (ref 26.0–34.0)
MCHC: 34.9 g/dL (ref 30.0–36.0)
MCV: 89.7 fL (ref 80.0–100.0)
Platelets: 183 10*3/uL (ref 150–400)
RBC: 4.37 MIL/uL (ref 4.22–5.81)
RDW: 11.8 % (ref 11.5–15.5)
WBC: 5.2 10*3/uL (ref 4.0–10.5)
nRBC: 0 % (ref 0.0–0.2)

## 2022-07-24 LAB — URINALYSIS, ROUTINE W REFLEX MICROSCOPIC
Bilirubin Urine: NEGATIVE
Glucose, UA: NEGATIVE mg/dL
Hgb urine dipstick: NEGATIVE
Ketones, ur: NEGATIVE mg/dL
Leukocytes,Ua: NEGATIVE
Nitrite: NEGATIVE
Protein, ur: NEGATIVE mg/dL
Specific Gravity, Urine: 1.006 (ref 1.005–1.030)
pH: 7 (ref 5.0–8.0)

## 2022-07-24 LAB — HIV ANTIBODY (ROUTINE TESTING W REFLEX): HIV Screen 4th Generation wRfx: NONREACTIVE

## 2022-07-24 LAB — TROPONIN I (HIGH SENSITIVITY)
Troponin I (High Sensitivity): 3 ng/L (ref <18)
Troponin I (High Sensitivity): 3 ng/L (ref <18)

## 2022-07-24 LAB — MAGNESIUM: Magnesium: 2 mg/dL (ref 1.7–2.4)

## 2022-07-24 LAB — TSH: TSH: 1.974 u[IU]/mL (ref 0.350–4.500)

## 2022-07-24 LAB — PHOSPHORUS: Phosphorus: 3.3 mg/dL (ref 2.5–4.6)

## 2022-07-24 MED ORDER — ACETAMINOPHEN 325 MG PO TABS: 650.0000 mg | ORAL_TABLET | Freq: Four times a day (QID) | ORAL | Status: DC | PRN

## 2022-07-24 MED ORDER — SODIUM CHLORIDE 0.9 % IV BOLUS
1000.0000 mL | Freq: Once | INTRAVENOUS | Status: AC
  Administered 2022-07-24: 1000 mL via INTRAVENOUS

## 2022-07-24 MED ORDER — POLYVINYL ALCOHOL 1.4 % OP SOLN
1.0000 [drp] | OPHTHALMIC | Status: DC | PRN
Start: 2022-07-24 — End: 2022-07-25

## 2022-07-24 MED ORDER — TAMSULOSIN HCL 0.4 MG PO CAPS
0.4000 mg | ORAL_CAPSULE | Freq: Every day | ORAL | Status: DC
  Administered 2022-07-24: 0.4 mg via ORAL
  Filled 2022-07-24: qty 1

## 2022-07-24 MED ORDER — CARBOXYMETHYLCELLULOSE SOD PF 1 % OP GEL
1.0000 | Freq: Every day | OPHTHALMIC | Status: DC
Start: 2022-07-24 — End: 2022-07-24

## 2022-07-24 MED ORDER — ACETAMINOPHEN 650 MG RE SUPP: 650.0000 mg | Freq: Four times a day (QID) | RECTAL | Status: DC | PRN

## 2022-07-24 MED ORDER — ACETAMINOPHEN 500 MG PO TABS
1000.0000 mg | ORAL_TABLET | Freq: Four times a day (QID) | ORAL | Status: DC | PRN
  Administered 2022-07-25: 1000 mg via ORAL
  Filled 2022-07-24: qty 2

## 2022-07-24 MED ORDER — METOPROLOL TARTRATE 12.5 MG HALF TABLET
12.5000 mg | ORAL_TABLET | Freq: Two times a day (BID) | ORAL | Status: DC
  Administered 2022-07-24 – 2022-07-25 (×2): 12.5 mg via ORAL
  Filled 2022-07-24 (×2): qty 1

## 2022-07-24 MED ORDER — LIDOCAINE 4 % EX CREA: TOPICAL_CREAM | Freq: Four times a day (QID) | CUTANEOUS | Status: DC | PRN

## 2022-07-24 MED ORDER — POLYVINYL ALCOHOL-POVIDONE PF 1.4-0.6 % OP SOLN: 2.0000 [drp] | Freq: Every day | OPHTHALMIC | Status: DC | PRN

## 2022-07-24 MED ORDER — CYCLOBENZAPRINE HCL 5 MG PO TABS: ORAL_TABLET | ORAL | 11 refills | Status: DC

## 2022-07-24 MED ORDER — HYDROCORTISONE (PERIANAL) 2.5 % EX CREA: 1.0000 | TOPICAL_CREAM | Freq: Two times a day (BID) | CUTANEOUS | 5 refills | Status: AC | PRN

## 2022-07-24 MED ORDER — MELOXICAM 15 MG PO TABS: 15.0000 mg | ORAL_TABLET | Freq: Every day | ORAL | 2 refills | Status: DC | PRN

## 2022-07-24 MED ORDER — SODIUM FLUORIDE 1.1 % DT GEL: Freq: Every day | DENTAL | Status: DC

## 2022-07-24 MED ORDER — METOPROLOL TARTRATE 5 MG/5ML IV SOLN: 2.5000 mg | Freq: Four times a day (QID) | INTRAVENOUS | Status: DC | PRN

## 2022-07-24 MED ORDER — POTASSIUM CHLORIDE CRYS ER 20 MEQ PO TBCR
40.0000 meq | EXTENDED_RELEASE_TABLET | Freq: Once | ORAL | Status: AC
  Administered 2022-07-24: 40 meq via ORAL
  Filled 2022-07-24: qty 2

## 2022-07-24 MED ORDER — OMEPRAZOLE 40 MG PO CPDR: DELAYED_RELEASE_CAPSULE | ORAL | 3 refills | Status: DC

## 2022-07-24 NOTE — Assessment & Plan Note (Signed)
Chronic No symptoms consistent with angina Continue Repatha, Crestor 20 mg daily Exercise, healthy diet advised Check lipid panel, CMP, CBC

## 2022-07-24 NOTE — Progress Notes (Addendum)
Family reported more episodes of PVCs. D/W on call cardiology Dr. Percival Spanish, who reviewed patient's case and recommend trending of Trop and Echo, and if Echo showed on significant acute issue, patient can go home tomorrow.  Start low dose of Metoprolol 12.5 mg BID.
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
## 2022-07-24 NOTE — Assessment & Plan Note (Addendum)
Chronic Taking Celebrex 200 mg twice daily as needed-does not help much Will switch back to meloxicam 15 mg daily prn  since he uses it occasionally only Tylenol as needed

## 2022-07-24 NOTE — Assessment & Plan Note (Signed)
Chronic Regular exercise and healthy diet encouraged Check lipid panel  Continue Repatha, Crestor 20 mg daily

## 2022-07-24 NOTE — Progress Notes (Signed)
Responded to ED page to support patient and staff.  Pt not accessible due medical team working with patient. Chaplain will return and is available as needed.  Jaclynn Major, [place], [facility], Pager [phone]

## 2022-07-24 NOTE — Assessment & Plan Note (Signed)
Chronic GERD controlled Continue omeprazole 40 mg daily 

## 2022-07-24 NOTE — Assessment & Plan Note (Signed)
Chronic Check a1c Low sugar / carb diet Stressed regular exercise  

## 2022-07-24 NOTE — ED Notes (Signed)
X-ray at bedside
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
## 2022-07-24 NOTE — ED Provider Notes (Signed)
[facility] Provider Note   CSN: [id number] Arrival date & time: 07/24/22  1504     History  Chief Complaint  Patient presents with   Electrocution    [facility] Santiago is a 71 y.o. male.  Patient is here after being electrocuted.  Patient works as an Clinical biochemist.  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  He had about 5 seconds of exposure to the 277 AC voltage.  He was able to pull himself off without losing consciousness.  He did not fall.  He is not having any discomfort right now.  He felt the bulges go from 1 arm to the other.  He does not have a burn.  No chest pain or shortness of breath or weakness or numbness.  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  The history is provided by the patient.       Home Medications Prior to Admission medications   Medication Sig Start Date End Date Taking? Authorizing Provider  cyclobenzaprine (FLEXERIL) 5 MG tablet TAKE 1 TABLET THREE TIMES A DAY AS NEEDED FOR MUSCLE SPASMS 07/24/22   Burns, Claudina Lick, MD  Evolocumab (REPATHA SURECLICK) 417 MG/ML SOAJ INJECT 1 DOSE UNDER THE SKIN EVERY 14 DAYS 02/09/22   Richardo Priest, MD  fluticasone (FLONASE) 50 MCG/ACT nasal spray Place 2 sprays into both nostrils at bedtime.    [provider]  hydrocortisone (ANUSOL-HC) 2.5 % rectal cream Place 1 Application rectally 2 (two) times daily as needed for hemorrhoids or anal itching. 07/24/22   Binnie Rail, MD  meloxicam (MOBIC) 15 MG tablet Take 1 tablet (15 mg total) by mouth daily as needed for pain. 07/24/22   Binnie Rail, MD  Multiple Vitamin (MULTIVITAMIN) tablet Take 1 tablet by mouth daily.    [provider]  omeprazole (PRILOSEC) 40 MG capsule TAKE 1 CAPSULE DAILY 07/24/22   Binnie Rail, MD  rosuvastatin (CRESTOR) 20 MG tablet Take 1 tablet by mouth daily. 07/24/22    Richardo Priest, MD  silodosin (RAPAFLO) 8 MG CAPS capsule Take 8 mg by mouth daily. 04/25/22   [provider]  sodium fluoride (FLUORISHIELD) 1.1 % GEL dental gel USE ONCE DAILY AFTER BRUSHING. PLACE IN TRAY FOR 5 MINUTES. DO NOT RINSE OR EAT 30 MINUTES AFTER APPLICATION 03/31/13   [provider]      Allergies    Patient has no known allergies.    Review of Systems   Review of Systems  Physical Exam Updated Vital Signs BP 130/76   Pulse 74   Temp 98.6 F (37 C) (Oral)   Resp 16   Ht '5\' 11"'$  (1.803 m)   Wt 77.1 kg   SpO2 98%   BMI 23.71 kg/m  Physical Exam Vitals and nursing note reviewed.  Constitutional:      General: He is not in acute distress.    Appearance: He is well-developed. He is not ill-appearing.  HENT:     Head: Normocephalic and atraumatic.     Nose: Nose normal.     Mouth/Throat:     Mouth: Mucous membranes are moist.  Eyes:     Extraocular Movements: Extraocular movements intact.     Conjunctiva/sclera: Conjunctivae normal.     Pupils: Pupils are equal, round, and reactive to light.  Cardiovascular:     Rate  and Rhythm: Normal rate and regular rhythm.     Pulses: Normal pulses.     Heart sounds: Normal heart sounds. No murmur heard. Pulmonary:     Effort: Pulmonary effort is normal. No respiratory distress.     Breath sounds: Normal breath sounds.  Abdominal:     Palpations: Abdomen is soft.     Tenderness: There is no abdominal tenderness.  Musculoskeletal:        General: No swelling or tenderness.     Cervical back: Neck supple.  Skin:    General: Skin is warm and dry.     Capillary Refill: Capillary refill takes less than 2 seconds.     Findings: No rash.  Neurological:     Mental Status: He is alert. Mental status is at baseline.  Psychiatric:        Mood and Affect: Mood normal.     ED Results / Procedures / Treatments   Labs (all labs ordered are listed, but only abnormal results are displayed) Labs Reviewed   BASIC METABOLIC PANEL - Abnormal; Notable for the following components:      Result Value   Glucose, Bld 109 (*)    All other components within normal limits  CBC  CK  URINALYSIS, ROUTINE W REFLEX MICROSCOPIC  HEPATIC FUNCTION PANEL  MAGNESIUM  MYOGLOBIN, URINE  TROPONIN I (HIGH SENSITIVITY)  TROPONIN I (HIGH SENSITIVITY)    EKG EKG Interpretation  Date/Time:  Tuesday July 24 2022 15:44:49 EDT Ventricular Rate:  65 PR Interval:  174 QRS Duration: 105 QT Interval:  413 QTC Calculation: 430 R Axis:   -36 Text Interpretation: Sinus rhythm Left axis deviation Abnormal R-wave progression, early transition Confirmed by Lennice Sites ([id number]) on 07/24/2022 4:34:03 PM  Radiology DG Chest Port 1 View  Result Date: 07/24/2022 CLINICAL DATA:  High-voltage electric line touch. EXAM: PORTABLE CHEST 1 VIEW COMPARISON:  Chest x-ray 03/12/2016 FINDINGS: The heart size and mediastinal contours are within normal limits. Both lungs are clear. The visualized skeletal structures are unremarkable. IMPRESSION: No active disease. Electronically Signed   By: Ronney Asters M.D.   On: 07/24/2022 15:35    Procedures Procedures    Medications Ordered in ED Medications  sodium chloride 0.9 % bolus 1,000 mL (1,000 mLs Intravenous New Bag/Given 07/24/22 1531)    ED Course/ Medical Decision Making/ A&P                           Medical Decision Making Amount and/or Complexity of Data Reviewed Labs: ordered. Radiology: ordered.  Risk Decision regarding hospitalization.   [facility] is here after being electrocuted at work.  Patient with history of salivary duct carcinoma, high cholesterol, CAD.  Patient states that he was working with an Sports administrator.  Thought that the unit had been turned off but he said that for about 5 seconds he had a 277 V AC current electrocution.  He touched box in his left upper arm.  He felt tingling from his upper arm although through his right arm but was able to break  free after 5 seconds.  He did not lose consciousness.  Did not hit his head.  He is not had any evidence of burn.  He is not having any discomfort in his arms.  No chest pain.  EKG per my review and interpretation shows sinus rhythm, no ischemic changes.  On the monitor he has been with some frequent PVCs however.  Overall we  will get CK, CBC, CMP, magnesium, urinalysis, chest x-ray.  Per my review interpretation the labs is no significant anemia, electrolyte abnormality, kidney injury, leukocytosis.  Urinalysis negative for infection.  CK within normal limits.  Opponent within normal limits.  Overall we will admit for observation for telemetry and further monitoring of his cardiac rhythm.  This chart was dictated using voice recognition software.  Despite best efforts to proofread,  errors can occur which can change the documentation meaning.         Final Clinical Impression(s) / ED Diagnoses Final diagnoses:  Electrocution    Rx / DC Orders ED Discharge Orders     None         Lennice Sites, DO 07/24/22 1724

## 2022-07-24 NOTE — Assessment & Plan Note (Signed)
Chronic Intermittent Especially with work and related to cancer surgery Continue flexeril 5 mg daily

## 2022-07-24 NOTE — Patient Instructions (Addendum)
     Blood work was ordered.     Medications changes include :   none   Your prescription(s) have been sent to your pharmacy.    A referral was ordered for [facility] for your trigger finger.     Someone from that office will call you to schedule an appointment.    Return in about 1 year (around 07/25/2023) for Physical Exam.

## 2022-07-25 ENCOUNTER — Observation Stay (HOSPITAL_BASED_OUTPATIENT_CLINIC_OR_DEPARTMENT_OTHER): Payer: Worker's Compensation

## 2022-07-25 DIAGNOSIS — R011 Cardiac murmur, unspecified: Secondary | ICD-10-CM | POA: Diagnosis not present

## 2022-07-25 DIAGNOSIS — T754XXA Electrocution, initial encounter: Secondary | ICD-10-CM | POA: Diagnosis not present

## 2022-07-25 LAB — ECHOCARDIOGRAM COMPLETE
Calc EF: 47.2 %
Height: 71 in
S' Lateral: 3.1 cm
Single Plane A2C EF: 48.1 %
Single Plane A4C EF: 45.1 %
Weight: 2777.6 oz

## 2022-07-25 LAB — BASIC METABOLIC PANEL
Anion gap: 5 (ref 5–15)
BUN: 16 mg/dL (ref 8–23)
CO2: 25 mmol/L (ref 22–32)
Calcium: 8.8 mg/dL — ABNORMAL LOW (ref 8.9–10.3)
Chloride: 110 mmol/L (ref 98–111)
Creatinine, Ser: 0.98 mg/dL (ref 0.61–1.24)
GFR, Estimated: >60 mL/min (ref >60)
Glucose, Bld: 99 mg/dL (ref 70–99)
Potassium: 4 mmol/L (ref 3.5–5.1)
Sodium: 140 mmol/L (ref 135–145)

## 2022-07-25 LAB — CK: Total CK: 122 U/L (ref 49–397)

## 2022-07-25 LAB — TROPONIN I (HIGH SENSITIVITY): Troponin I (High Sensitivity): 3 ng/L (ref <18)

## 2022-07-25 MED ORDER — PANTOPRAZOLE SODIUM 40 MG PO TBEC
80.0000 mg | DELAYED_RELEASE_TABLET | Freq: Every day | ORAL | Status: DC
  Administered 2022-07-25: 80 mg via ORAL
  Filled 2022-07-25: qty 2

## 2022-07-25 NOTE — Discharge Summary (Signed)
DISCHARGE SUMMARY  [facility]  MR#: [id number]  DOB:11/20/1951  Date of Admission: 07/24/2022 Date of Discharge: 07/25/2022  Attending Physician:Marlaya Turck Hennie Duos, MD  Patient's GBT:DVVOH, Claudina Lick, MD  Consults: admitting MD discussed w/ Cardiology via phone   Disposition: D/C home   Follow-up Appts:  Follow-up Information     Binnie Rail, MD Follow up.   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  He reported sharp chest pain during the episode with the sensation of the current traveling from his left upper arm and shoulder through his right shoulder into his right arm.  The pain in his chest resolved after he was able to break the current.  There was no loss of consciousness or fall.  There were no significant cutaneous burn marks.  In the ER the patient was clinically stable but telemetry revealed frequent PVCs with occasional episodes of bigeminy.  Hospital Course: The patient was admitted to the acute units and monitored on telemetry.  He suffered no acute changes in his status during his hospital stay.  Laboratories remained stable, and there is no evidence of rhabdomyolysis. The frequency of his PVCs diminished greatly over the course of his hospital stay.  A TTE was accomplished and revealed no significant structural heart disease.  It did note a very slightly diminished EF at 50% but no focal wall motion abnormalities.  He is scheduled for outpatient follow-up with Cardiology already.   While he was initially placed on low-dose empiric beta-blocker at the time of his admission, the decision was made to discontinue this at discharge as there did not seem to be a clear indication for its chronic use, and the patient was noted to have some bradycardia down to 50 during his hospital stay.   At the time of discharge he remained without acute complaints and was ambulating without difficulty.   Allergies as of 07/25/2022   No Known Allergies      Medication List     STOP taking these medications    meloxicam 15 MG tablet Commonly known as: MOBIC       TAKE these medications    acetaminophen 500 MG tablet Commonly known as: TYLENOL Take 1,000 mg by mouth every 6 (six) hours as needed for mild pain, moderate pain, fever or headache.   cyclobenzaprine 5 MG tablet Commonly known as: FLEXERIL TAKE 1 TABLET THREE TIMES A DAY AS NEEDED FOR MUSCLE SPASMS   fluticasone 50 MCG/ACT nasal spray Commonly known as: FLONASE Place 2 sprays into both nostrils at bedtime.   hydrocortisone 2.5 % rectal cream Commonly known as: Anusol-HC Place 1 Application rectally 2 (two) times daily as needed for hemorrhoids or anal itching.   multivitamin tablet Take 1 tablet by mouth daily.   omeprazole 40 MG capsule Commonly known as: PRILOSEC TAKE 1 CAPSULE DAILY   Refresh 1.4-0.6 % Soln Generic drug: Polyvinyl Alcohol-Povidone PF Apply 2 drops to eye daily as needed (right eye dryness, unable to close right eye).   Refresh Celluvisc  1 % Gel Generic drug: Carboxymethylcellulose Sod PF Apply 1 Dose to eye at bedtime.   Repatha SureClick 680 MG/ML Soaj Generic drug: Evolocumab INJECT 1 DOSE UNDER THE SKIN EVERY 14 DAYS   rosuvastatin 20 MG tablet Commonly known as: CRESTOR Take 1 tablet by mouth daily.   silodosin 8 MG Caps capsule Commonly known as: RAPAFLO Take 8 mg by mouth daily.   sodium fluoride 1.1 % Gel dental gel Commonly known as: FLUORISHIELD USE ONCE DAILY  AFTER BRUSHING. PLACE IN TRAY FOR 5 MINUTES. DO NOT RINSE OR EAT 30 MINUTES AFTER APPLICATION        Day of Discharge BP 126/89 (BP Location: Left Arm)   Pulse 64   Temp 98.8 F (37.1 C) (Oral)   Resp 16   Ht '5\' 11"'$  (1.803 m)   Wt 78.7 kg   SpO2 99%   BMI 24.21 kg/m   Physical Exam: General: No acute respiratory distress Lungs: Clear to auscultation bilaterally without wheezes or crackles Cardiovascular: Regular rate and rhythm without murmur gallop or rub normal S1 and S2 Abdomen: Nontender, nondistended, soft, bowel sounds positive, no rebound, no ascites, no appreciable mass Extremities: No significant cyanosis, clubbing, or edema bilateral lower extremities  Basic Metabolic Panel: Recent Labs  Lab 07/24/22 1522 07/24/22 1526 07/24/22 1759 07/25/22 0217  NA 138  --   --  140  K 3.7  --   --  4.0  CL 106  --   --  110  CO2 27  --   --  25  GLUCOSE 109*  --   --  99  BUN 12  --   --  16  CREATININE 1.00  --   --  0.98  CALCIUM 9.2  --   --  8.8*  MG  --  2.0  --   --   PHOS  --   --  3.3  --     Liver Function Tests: Recent Labs  Lab 07/24/22 1526  AST 24  ALT 19  ALKPHOS 57  BILITOT 0.6  PROT 6.7  ALBUMIN 4.3    CBC: Recent Labs  Lab 07/24/22 1522  WBC 5.2  HGB 13.7  HCT 39.2  MCV 89.7  PLT 183    Cardiac Enzymes: Recent Labs  Lab 07/24/22 1526 07/25/22 0217  CKTOTAL 174 122    Time spent in discharge (includes decision making & examination of pt): <30 minutes  07/25/2022, 5:10 PM   Cherene Altes, MD [facility] Office  [phone]

## 2022-07-25 NOTE — Care Management (Signed)
[id number] 07-25-22 Case Manager spoke with patient regarding Workers Comp listed in [place]. Patient has not been assigned to a Case Manager since the electrocution occurred last night. Patient works as an Clinical biochemist at [employer]. PTA patient states he was independent and he states he should not have any home needs. Patient will follow up with the Workers Comp Case Freight forwarder post hospitalization. No further needs identified from Case Manager at this time.

## 2022-07-25 NOTE — Progress Notes (Signed)
   07/25/22 1500  Mobility  Activity Refused mobility   Mobility Specialist Progress Note  Pt refused mobility. Ambulating independently in hallway.   Lucious Groves Mobility Specialist

## 2022-09-03 ENCOUNTER — Encounter: Payer: Self-pay | Admitting: Emergency Medicine

## 2022-09-03 ENCOUNTER — Ambulatory Visit
Admission: EM | Admit: 2022-09-03 | Discharge: 2022-09-03 | Disposition: A | Discharge status: Home / Self Care | Payer: BC Managed Care – PPO | Attending: Emergency Medicine | Admitting: Emergency Medicine

## 2022-09-03 DIAGNOSIS — J069 Acute upper respiratory infection, unspecified: Secondary | ICD-10-CM | POA: Diagnosis not present

## 2022-09-03 DIAGNOSIS — B349 Viral infection, unspecified: Secondary | ICD-10-CM | POA: Diagnosis present

## 2022-09-03 DIAGNOSIS — Z1152 Encounter for screening for COVID-19: Secondary | ICD-10-CM

## 2022-09-03 MED ORDER — ONDANSETRON 4 MG PO TBDP: 4.0000 mg | ORAL_TABLET | Freq: Three times a day (TID) | ORAL | 0 refills | Status: DC | PRN

## 2022-09-03 NOTE — ED Provider Notes (Signed)
HPI  SUBJECTIVE:  [facility] is a 71 y.o. male who presents with nasal congestion, headache, body aches, fatigue, nausea, vomiting, rhinorrhea, postnasal drip, sore throat secondary to the postnasal drip, and occasional cough that is productive of phlegm starting yesterday.  He returned from a cruise 5 days ago.  No fevers, loss of sense of smell or taste, wheezing, shortness of breath.  He had a negative home COVID test.  No known COVID or flu exposure.  He got 4 doses of the COVID-vaccine.  He has tried Advil cold and sounds, Advil, Flonase and Tylenol.  The Advil Cold and Sinus helps.  Denies any fractures.  He has a past medical history of hypercholesterolemia, parotid gland cancer status postradiation, chemo, surgery with lymph node dissection.  No history of COVID.  He has a remote history of a gastric ulcer for which he takes omeprazole.  No history of COVID.   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   HLD (hyperlipidemia)    on meds   Knee pain 04/23/2018   Oropharyngeal dysphagia 06/02/2019   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[place]) 10/14/2018   Added automatically from request for surgery [id number]   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 ([place]) 11/30/2018   Surgery 11/07/18 - Dr Azucena Cecil- radical parotidectomy with  facial nerve sacrifice, neck dissection and dissection/resection of the nerve through the mastoid and tympanic segments   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/02/2019   Stye 05/22/2019    Past Surgical History:  Procedure Laterality Date   arm surgery Left 1996   left forearm, ligaments, nerve and tendon repair, from accident   [place]   from accident-wound closure   EYE SURGERY  11/2018   eye lid repair x 3    HAND SURGERY Right 1996   middle, ring, and little finger filayed open in accident   MENISCUS REPAIR Right 2018   [place]   SALIVARY GLAND SURGERY Right    Nov 2019   WISDOM TOOTH EXTRACTION      Family History  Problem Relation Age of Onset   Prostate cancer Father 37   Arthritis Father    Lymphoma Father 6   Colon polyps Mother 62   Diabetes Mother    Parkinson's disease Mother    Arthritis Mother    Arthritis Paternal Grandmother    Heart attack Paternal Grandfather    Heart disease Paternal Grandfather    Colon cancer Neg Hx    Esophageal cancer Neg Hx    Stomach cancer Neg Hx    Rectal cancer Neg Hx     Social History   Tobacco Use   Smoking status: Former    Packs/day: 0.50  Years: 5.00    Total pack years: 2.50    Types: Cigarettes    Quit date: 12/24/1982    Years since quitting: 39.7   Smokeless tobacco: Never  Vaping Use   Vaping Use: Never used  Substance Use Topics   Alcohol use: Yes    Comment: 1 x per month   Drug use: No    No current facility-administered medications for this encounter.  Current Outpatient Medications:    ondansetron (ZOFRAN-ODT) 4 MG disintegrating tablet, Take 1 tablet (4 mg total) by mouth every 8 (eight) hours as needed for nausea or vomiting., Disp: 20 tablet, Rfl: 0   acetaminophen (TYLENOL) 500 MG tablet, Take 1,000 mg by mouth every 6 (six) hours as needed for mild pain, moderate pain, fever or headache., Disp: , Rfl:    Carboxymethylcellulose Sod PF (REFRESH  CELLUVISC) 1 % GEL, Apply 1 Dose to eye at bedtime., Disp: , Rfl:    Evolocumab (REPATHA SURECLICK) 937 MG/ML SOAJ, INJECT 1 DOSE UNDER THE SKIN EVERY 14 DAYS, Disp: 6 mL, Rfl: 3   fluticasone (FLONASE) 50 MCG/ACT nasal spray, Place 2 sprays into both nostrils at bedtime., Disp: , Rfl:    hydrocortisone (ANUSOL-HC) 2.5 % rectal cream, Place 1 Application rectally 2 (two) times daily as needed for hemorrhoids or anal itching. (Patient not taking: Reported on 07/24/2022), Disp: 30 g, Rfl: 5   Multiple Vitamin (MULTIVITAMIN) tablet, Take 1 tablet by mouth daily., Disp: , Rfl:    omeprazole (PRILOSEC) 40 MG capsule, TAKE 1 CAPSULE DAILY, Disp: 90 capsule, Rfl: 3   Polyvinyl Alcohol-Povidone PF (REFRESH) 1.4-0.6 % SOLN, Apply 2 drops to eye daily as needed (right eye dryness, unable to close right eye)., Disp: , Rfl:    rosuvastatin (CRESTOR) 20 MG tablet, Take 1 tablet by mouth daily., Disp: 30 tablet, Rfl: 0   silodosin (RAPAFLO) 8 MG CAPS capsule, Take 8 mg by mouth daily., Disp: , Rfl:    sodium fluoride (FLUORISHIELD) 1.1 % GEL dental gel, USE ONCE DAILY AFTER BRUSHING. PLACE IN TRAY FOR 5 MINUTES. DO NOT RINSE OR EAT 30 MINUTES AFTER APPLICATION, Disp: , Rfl:   No Known Allergies   ROS  As noted in HPI.   Physical Exam  BP 114/79   Pulse 84   Temp 98.5 F (36.9 C)   Resp 18   SpO2 97%   Constitutional: Well developed, well nourished, no acute distress Eyes:  EOMI, conjunctiva normal bilaterally HENT: Normocephalic, atraumatic,mucus membranes moist.  Positive nasal congestion.  No sinus tenderness.  Tonsils surgically absent.  No postnasal drip. Neck: No cervical lymphadenopathy Respiratory: Normal inspiratory effort, lungs clear bilaterally Cardiovascular: Normal rate, regular rhythm, no murmurs rubs or gallops GI: nondistended skin: No rash, skin intact Musculoskeletal: no deformities Neurologic: Alert & oriented x 3, no focal neuro deficits Psychiatric: Speech and behavior  appropriate  ED Course   Medications - No data to display  Orders Placed This Encounter  Procedures   Resp Panel by RT-PCR (Flu A&B, Covid) Anterior Nasal Swab    Standing Status:   Standing    Number of Occurrences:   1    Order Specific Question:   Patient immune status    Answer:   Normal    Order Specific Question:   Release to patient    Answer:   Immediate    No results found for this or any previous visit (from the past 24 hour(s)). No results found.  ED Clinical Impression  1. Viral  illness   2. Encounter for screening for COVID-19      ED Assessment/Plan     Patient with acute illness with systemic symptoms.  Presentation concerning for COVID or influenza.  COVID, flu sent.  Will prescribe molnupiravir or Tamiflu based test results.  In the meantime, he is to continue his Flonase, Advil Cold and Sinus, advised him to be careful with the Advil, may continue Tylenol.  Start saline nasal irrigation.  Will send home with Zofran.  Follow-up with PCP as needed.  COVID, influenza labs pending at the time of initial signing of this note.  Discussed labs,  MDM, treatment plan, and plan for follow-up with patient. patient agrees with plan.   Meds ordered this encounter  Medications   ondansetron (ZOFRAN-ODT) 4 MG disintegrating tablet    Sig: Take 1 tablet (4 mg total) by mouth every 8 (eight) hours as needed for nausea or vomiting.    Dispense:  20 tablet    Refill:  0      *This clinic note was created using [employer]. Therefore, there may be occasional mistakes despite careful proofreading.  ?    Melynda Ripple, MD 09/04/22 [phone]

## 2022-09-03 NOTE — ED Triage Notes (Signed)
Pt here with body aches, congestion, headache and nausea since yesterday. Pt returned from 16 day cruise on Wednesday.

## 2022-09-03 NOTE — Discharge Instructions (Addendum)
We will contact you if your COVID or flu come back positive and prescribe the appropriate antiviral.  Continu Flonase, Advil Cold and Sinus-  be careful with the Advil-  continue Tylenol.  Start saline nasal irrigation.   Zofran as needed for nausea, vomiting.Marland Kitchen

## 2022-09-04 LAB — RESP PANEL BY RT-PCR (FLU A&B, COVID) ARPGX2
Influenza A by PCR: NEGATIVE
Influenza B by PCR: NEGATIVE
SARS Coronavirus 2 by RT PCR: POSITIVE — AB

## 2022-09-05 ENCOUNTER — Telehealth (HOSPITAL_COMMUNITY): Payer: Self-pay | Admitting: Emergency Medicine

## 2022-09-06 ENCOUNTER — Ambulatory Visit: Payer: BC Managed Care – PPO | Admitting: Cardiology

## 2022-09-22 ENCOUNTER — Other Ambulatory Visit: Payer: Self-pay | Admitting: Cardiology

## 2022-10-08 ENCOUNTER — Ambulatory Visit (HOSPITAL_BASED_OUTPATIENT_CLINIC_OR_DEPARTMENT_OTHER): Payer: BC Managed Care – PPO

## 2022-10-10 ENCOUNTER — Ambulatory Visit: Payer: BC Managed Care – PPO | Admitting: Cardiology

## 2022-10-11 ENCOUNTER — Ambulatory Visit (HOSPITAL_COMMUNITY)
Admission: RE | Admit: 2022-10-11 | Discharge: 2022-10-11 | Disposition: A | Discharge status: Home / Self Care | Payer: BC Managed Care – PPO | Source: Ambulatory Visit | Attending: Cardiology | Admitting: Cardiology

## 2022-10-11 DIAGNOSIS — I2510 Atherosclerotic heart disease of native coronary artery without angina pectoris: Secondary | ICD-10-CM | POA: Diagnosis present

## 2022-10-11 NOTE — Progress Notes (Signed)
Carotid duplex has been completed.   Preliminary results in CV Proc.   Greg[facility] Santiago Gregory Santiago 10/11/2022 4:02 PM

## 2022-10-12 ENCOUNTER — Other Ambulatory Visit: Payer: Self-pay | Admitting: Cardiology

## 2022-10-21 NOTE — Progress Notes (Unsigned)
Cardiology Office Note:    Date:  10/22/2022   ID:  Gregory Santiago, DOB 09-Apr-1951, MRN [id number]  PCP:  Binnie Rail, MD  Cardiologist:  Shirlee More, MD    Referring MD: Binnie Rail, MD    ASSESSMENT:    1. Elevated lipoprotein(a)   2. Coronary artery calcification seen on CAT scan   3. Agatston coronary artery calcium score greater than 400   4. Atherosclerosis of both carotid arteries    PLAN:    In order of problems listed above:  He is on combined treatment PCSK9 inhibitor high intensity statin recheck CMP lipids and LP(a) level.  When novel treatments become available will be ideal for her. Recent carotid duplex reassuring with minimal atherosclerosis   Next appointment: 1 year   Medication Adjustments/Labs and Tests Ordered: Current medicines are reviewed at length with the patient today.  Concerns regarding medicines are outlined above.  Orders Placed This Encounter  Procedures   Comprehensive metabolic panel   Lipid panel   Lipoprotein A (LPA)   No orders of the defined types were placed in this encounter.  Chief complaint follow-up hyperlipidemia   History of Present Illness:    Gregory Santiago is a 71 y.o. 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  He is on combined Repatha and high dose high intensity statin.  Compliance with diet, lifestyle and medications: Yes  He has had further ENT reconstructive surgery. He tolerates his combined lipid-lowering therapy without muscle pain or weakness His had no cardiovascular symptoms of TIA edema shortness of breath chest pain palpitation or syncope  Carotid duplex 10/11/2022 which showed minimal atherosclerosis and stenosis 1 to 39% bilaterally.  Echocardiogram  07/25/2022 showed a low normal ejection fraction 50% with mild global hypokinesiaRight ventricular size function and pulmonary artery pressure. 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   HLD (hyperlipidemia)    on meds   Knee pain 04/23/2018   Oropharyngeal dysphagia 06/02/2019   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[place]) 10/14/2018   Added automatically from request for surgery [id number]   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   Surgery 11/07/18 - Dr Azucena Cecil- radical parotidectomy with facial nerve sacrifice, neck dissection and dissection/resection of the nerve through the mastoid and tympanic segments   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/02/2019   Stye 05/22/2019    Past Surgical History:  Procedure Laterality Date   arm surgery Left 1996   left forearm, ligaments, nerve and tendon repair, from accident   CHEST SURGERY  1996   from accident-wound closure   EYE SURGERY  11/2018   eye lid repair x 3    HAND SURGERY Right 1996   middle, ring, and little finger filayed open in accident   MENISCUS REPAIR Right 2018   [facility]   SALIVARY GLAND SURGERY Right    Nov 2019   WISDOM TOOTH EXTRACTION      Current Medications: Current Meds  Medication Sig   acetaminophen  (TYLENOL) 500 MG tablet Take 1,000 mg by mouth every 6 (six) hours as needed for mild pain, moderate pain, fever or headache.   Carboxymethylcellulose Sod PF (REFRESH CELLUVISC) 1 % GEL Apply 1 Dose to eye at bedtime.   celecoxib (CELEBREX) 200 MG capsule Take 1 capsule by mouth 2 (two) times daily. X 14 DAYS   Evolocumab (REPATHA SURECLICK) 706 MG/ML SOAJ INJECT 1 DOSE UNDER THE SKIN EVERY 14 DAYS   fluticasone (FLONASE) 50 MCG/ACT nasal spray Place 2 sprays into both nostrils at bedtime.   hydrocortisone (ANUSOL-HC) 2.5 % rectal cream Place 1 Application rectally 2 (two) times daily as needed for hemorrhoids or anal itching.   Multiple Vitamin (MULTIVITAMIN) tablet Take 1 tablet by mouth daily.   omeprazole (PRILOSEC) 40 MG capsule TAKE 1 CAPSULE DAILY   ondansetron (ZOFRAN-ODT) 4 MG disintegrating tablet Take 1 tablet (4 mg total) by mouth every 8 (eight) hours as needed for nausea or vomiting.   Polyvinyl Alcohol-Povidone PF (REFRESH) 1.4-0.6 % SOLN Apply 2 drops to eye daily as needed (right eye dryness, unable to close right eye).   rosuvastatin (CRESTOR) 20 MG tablet Take 1 tablet (20 mg total) by mouth daily. Please keep scheduled appointment for additional refills. 3rd Attempt.   silodosin (RAPAFLO) 8 MG CAPS capsule Take 8 mg by mouth daily.   sodium fluoride (FLUORISHIELD) 1.1 % GEL dental gel USE ONCE DAILY AFTER BRUSHING. PLACE IN TRAY FOR 5 MINUTES. DO NOT RINSE OR EAT 30 MINUTES AFTER APPLICATION     Allergies:   Patient has no known allergies.   Social History   Socioeconomic History   Marital status: Married    Spouse name: Not on file   Number of children: 3   Years of education: 1 year of college   Highest education level: Some college, no degree  Occupational History   Occupation: Programmer, systems: [employer]  Tobacco Use   Smoking status: Former    Packs/day: 0.50    Years: 5.00    Total pack years: 2.50    Types: Cigarettes    Quit date: 12/24/1982     Years since quitting: 39.8   Smokeless tobacco: Never  Vaping Use   Vaping Use: Never used  Substance and Sexual Activity   Alcohol use: Yes    Comment: 1 x per month   Drug use: No   Sexual activity: Not on file  Other Topics Concern   Not on file  Social History Narrative   Exercise: not currently running   Lives at home with wife    Right handed   Caffeine: 6 cups daily   Social Determinants of Health   Financial Resource Strain: Not on file  Food Insecurity: Not on file  Transportation Needs: Not on file  Physical Activity: Not on file  Stress: Not on file  Social Connections: Not on file     Family History: The patient's family history includes Arthritis in his father, mother, and paternal grandmother; Colon polyps (age  of onset: 72) in his mother; Diabetes in his mother; Heart attack in his paternal grandfather; Heart disease in his paternal grandfather; Lymphoma (age of onset: 51) in his father; Parkinson's disease in his mother; Prostate cancer (age of onset: 11) in his father. There is no history of Colon cancer, Esophageal cancer, Stomach cancer, or Rectal cancer. ROS:   Please see the history of present illness.    All other systems reviewed and are negative.  EKGs/Labs/Other Studies Reviewed:    The following studies were reviewed today:   Recent Labs: 07/24/2022: ALT 19; Hemoglobin 13.7; Magnesium 2.0; Platelets 183; TSH 1.974 07/25/2022: BUN 16; Creatinine, Ser 0.98; Potassium 4.0; Sodium 140  Recent Lipid Panel    Component Value Date/Time   CHOL 101 08/07/2021 0834   CHOL 98 (L) 01/23/2021 1640   TRIG 103.0 08/07/2021 0834   HDL 51.50 08/07/2021 0834   HDL 47 01/23/2021 1640   CHOLHDL 2 08/07/2021 0834   VLDL 20.6 08/07/2021 0834   LDLCALC 29 08/07/2021 0834   LDLCALC 32 01/23/2021 1640   LDLDIRECT 155.9 01/29/2014 1028    Physical Exam:    VS:  BP 130/78 (BP Location: Left Arm, Patient Position: Sitting)   Pulse 70   Ht '5\' 11"'$  (1.803 m)    Wt 180 lb (81.6 kg)   SpO2 98%   BMI 25.10 kg/m     Wt Readings from Last 3 Encounters:  10/22/22 180 lb (81.6 kg)  07/24/22 173 lb 9.6 oz (78.7 kg)  07/24/22 175 lb (79.4 kg)     GEN:  Well nourished, well developed in no acute distress HEENT: Normal NECK: No JVD; No carotid bruits LYMPHATICS: No lymphadenopathy CARDIAC: RRR, no murmurs, rubs, gallops RESPIRATORY:  Clear to auscultation without rales, wheezing or rhonchi  ABDOMEN: Soft, non-tender, non-distended MUSCULOSKELETAL:  No edema; No deformity  SKIN: Warm and dry NEUROLOGIC:  Alert and oriented x 3 PSYCHIATRIC:  Normal affect    Signed, Shirlee More, MD  10/22/2022 8:21 AM    [place]

## 2022-10-22 ENCOUNTER — Encounter: Payer: Self-pay | Admitting: Cardiology

## 2022-10-22 ENCOUNTER — Telehealth: Payer: Self-pay | Admitting: Cardiology

## 2022-10-22 ENCOUNTER — Other Ambulatory Visit: Payer: Self-pay

## 2022-10-22 ENCOUNTER — Ambulatory Visit: Payer: BC Managed Care – PPO | Attending: Cardiology | Admitting: Cardiology

## 2022-10-22 VITALS — BP 130/78 | HR 70 | Ht 71.0 in | Wt 180.0 lb

## 2022-10-22 DIAGNOSIS — I6523 Occlusion and stenosis of bilateral carotid arteries: Secondary | ICD-10-CM

## 2022-10-22 DIAGNOSIS — I2510 Atherosclerotic heart disease of native coronary artery without angina pectoris: Secondary | ICD-10-CM | POA: Diagnosis not present

## 2022-10-22 DIAGNOSIS — R931 Abnormal findings on diagnostic imaging of heart and coronary circulation: Secondary | ICD-10-CM

## 2022-10-22 DIAGNOSIS — E7841 Elevated Lipoprotein(a): Secondary | ICD-10-CM | POA: Diagnosis not present

## 2022-10-22 MED ORDER — REPATHA SURECLICK 140 MG/ML ~~LOC~~ SOAJ: SUBCUTANEOUS | 3 refills | Status: DC

## 2022-10-22 MED ORDER — ROSUVASTATIN CALCIUM 20 MG PO TABS: 20.0000 mg | ORAL_TABLET | Freq: Every day | ORAL | 3 refills | Status: DC

## 2022-10-22 NOTE — Telephone Encounter (Signed)
*  STAT* If patient is at the pharmacy, call can be transferred to refill team.   1. Which medications need to be refilled? (please list name of each medication and dose if known)  rosuvastatin (CRESTOR) 20 MG tablet  Evolocumab (REPATHA SURECLICK) 035 MG/ML SOAJ  2. Which pharmacy/location (including street and city if local pharmacy) is medication to be sent to? Rosuvastatin needs to be sent to:  [place], [place] [address]  Rosuvastatin needs to be sent to:  [place], [place] - [address] AT [place]  3. Do they need a 30 day or 90 day supply? [place]

## 2022-10-22 NOTE — Patient Instructions (Signed)
Medication Instructions:  Your physician recommends that you continue on your current medications as directed. Please refer to the Current Medication list given to you today.  *If you need a refill on your cardiac medications before your next appointment, please call your pharmacy*   Lab Work: Your physician recommends that you return for lab work in: Today for a CMP, Lipid Panel and LPa  If you have labs (blood work) drawn today and your tests are completely normal, you will receive your results only by: MyChart Message (if you have [place]) OR A paper copy in the mail If you have any lab test that is abnormal or we need to change your treatment, we will call you to review the results.   Testing/Procedures: NONE   Follow-Up: At [facility], you and your health needs are our priority.  As part of our continuing mission to provide you with exceptional heart care, we have created designated Provider Care Teams.  These Care Teams include your primary Cardiologist (physician) and Advanced Practice Providers (APPs -  Physician Assistants and Nurse Practitioners) who all work together to provide you with the care you need, when you need it.  We recommend signing up for the patient portal called "MyChart".  Sign up information is provided on this After Visit Summary.  MyChart is used to connect with patients for Virtual Visits (Telemedicine).  Patients are able to view lab/test results, encounter notes, upcoming appointments, etc.  Non-urgent messages can be sent to your provider as well.   To learn more about what you can do with MyChart, go to [email or web address].    Your next appointment:   1 year(s)  The format for your next appointment:   In Person  Provider:   Shirlee More, MD    Other Instructions   Important Information About Sugar

## 2022-10-22 NOTE — Telephone Encounter (Signed)
LVM for pt to call back regarding med refills and which pharmacy they need to be sent to.

## 2022-10-23 ENCOUNTER — Telehealth: Payer: Self-pay

## 2022-10-23 LAB — COMPREHENSIVE METABOLIC PANEL
ALT: 16 IU/L (ref 0–44)
AST: 17 IU/L (ref 0–40)
Albumin/Globulin Ratio: 2.3 — ABNORMAL HIGH (ref 1.2–2.2)
Albumin: 4.3 g/dL (ref 3.9–4.9)
Alkaline Phosphatase: 71 IU/L (ref 44–121)
BUN/Creatinine Ratio: 16 (ref 10–24)
BUN: 15 mg/dL (ref 8–27)
Bilirubin Total: 0.4 mg/dL (ref 0.0–1.2)
CO2: 25 mmol/L (ref 20–29)
Calcium: 9.3 mg/dL (ref 8.6–10.2)
Chloride: 105 mmol/L (ref 96–106)
Creatinine, Ser: 0.93 mg/dL (ref 0.76–1.27)
Globulin, Total: 1.9 g/dL (ref 1.5–4.5)
Glucose: 98 mg/dL (ref 70–99)
Potassium: 4.5 mmol/L (ref 3.5–5.2)
Sodium: 140 mmol/L (ref 134–144)
Total Protein: 6.2 g/dL (ref 6.0–8.5)
eGFR: 88 mL/min/{1.73_m2} (ref >59)

## 2022-10-23 LAB — LIPID PANEL
Chol/HDL Ratio: 2 ratio (ref 0.0–5.0)
Cholesterol, Total: 97 mg/dL — ABNORMAL LOW (ref 100–199)
HDL: 49 mg/dL (ref >39)
LDL Chol Calc (NIH): 30 mg/dL (ref 0–99)
Triglycerides: 90 mg/dL (ref 0–149)
VLDL Cholesterol Cal: 18 mg/dL (ref 5–40)

## 2022-10-23 LAB — LIPOPROTEIN A (LPA): Lipoprotein (a): 239.7 nmol/L — ABNORMAL HIGH (ref <75.0)

## 2022-10-23 NOTE — Telephone Encounter (Signed)
PA started with [facility]  [facility] (Key: [id number]) Rx #: [id number] Repatha SureClick '140MG'$ /ML auto-injectors   Form Blue Building control surveyor Form (CB) Created 21 hours ago Sent to Plan 7 minutes ago Plan Response 7 minutes ago Submit Clinical Questions 1 minute ago Determination Wait for Determination Please wait for [employer] to return a determination.

## 2022-10-23 NOTE — Telephone Encounter (Signed)
-----   Message from Richardo Priest, MD sent at 10/23/2022  8:27 AM EDT ----- Good result on current medications his LDL is ideal.  Unfortunately LP(a) remains quite elevated and when the new generation of medications on the market I will be sure that he gets put on it.  In the interim I think we are doing everything we need to do

## 2022-10-23 NOTE — Telephone Encounter (Signed)
Patient notified of th following per Dr. Bettina Gavia.

## 2022-10-25 NOTE — Telephone Encounter (Signed)
1st PA Key: Novella Olive was cancelled due to wrong form. I have sent a 2nd PA for [place] (Key: [id number]) Repatha SureClick '140MG'$ /ML auto-injectors   Form [employer] [place] Request for Waiver of Brand Drug Additional Fees Form Created 3 hours ago Sent to Plan 2 minutes ago Determination Wait for Determination Please wait for [employer] [place] to return a determination.

## 2022-10-31 NOTE — Telephone Encounter (Signed)
There is a PA previously approved for Repatha from 01/24/22 until 01/23/23.

## 2023-01-21 ENCOUNTER — Encounter: Payer: Self-pay | Admitting: Internal Medicine

## 2023-01-21 ENCOUNTER — Encounter: Payer: Self-pay | Admitting: Cardiology

## 2023-01-21 ENCOUNTER — Other Ambulatory Visit: Payer: Self-pay

## 2023-01-21 DIAGNOSIS — M159 Polyosteoarthritis, unspecified: Secondary | ICD-10-CM

## 2023-01-21 MED ORDER — ROSUVASTATIN CALCIUM 20 MG PO TABS: 20.0000 mg | ORAL_TABLET | Freq: Every day | ORAL | 3 refills | Status: DC

## 2023-01-21 MED ORDER — MELOXICAM 15 MG PO TABS: 15.0000 mg | ORAL_TABLET | Freq: Every day | ORAL | 0 refills | Status: DC

## 2023-01-21 MED ORDER — OMEPRAZOLE 40 MG PO CPDR: DELAYED_RELEASE_CAPSULE | ORAL | 2 refills | Status: DC

## 2023-01-21 NOTE — Telephone Encounter (Signed)
Pls advise on cyclobenzaprine.. Not on med list../l,mb

## 2023-01-22 ENCOUNTER — Encounter: Payer: Self-pay | Admitting: Cardiology

## 2023-01-23 ENCOUNTER — Telehealth: Payer: Self-pay | Admitting: Cardiology

## 2023-01-23 MED ORDER — CYCLOBENZAPRINE HCL 5 MG PO TABS: ORAL_TABLET | ORAL | 1 refills | Status: DC

## 2023-01-23 NOTE — Telephone Encounter (Signed)
Pt c/o medication issue:  1. Name of Medication:   Evolocumab (REPATHA SURECLICK) 244 MG/ML SOAJ    2. How are you currently taking this medication (dosage and times per day)?   3. Are you having a reaction (difficulty breathing--STAT)?   4. What is your medication issue? [facility] calling to get update on prior auth of medication.  Ref # [id number]

## 2023-01-23 NOTE — Telephone Encounter (Signed)
Prior Auth approved from 01/17/2023 Confirmation# [id number]  Patient notified through my chart

## 2023-01-24 ENCOUNTER — Telehealth: Payer: Self-pay | Admitting: Cardiology

## 2023-01-24 NOTE — Telephone Encounter (Signed)
Pt c/o medication issue:  1. Name of Medication:   Evolocumab (REPATHA SURECLICK) 074 MG/ML SOAJ    2. How are you currently taking this medication (dosage and times per day)? INJECT 1 DOSE UNDER THE SKIN EVERY 14 DAYS   3. Are you having a reaction (difficulty breathing--STAT)? No  4. What is your medication issue? Called to inform provider that Prior Auth is still needed. Pt was given the incorrect info. Please advise

## 2023-01-24 NOTE — Telephone Encounter (Signed)
Rep with [employer] is following up regarding PA. He wanted to inform our office that patient's previous insurance, BCBS, has been terminated. He states the patient is now insured with [place] and may possible need a new PA.  Callback#: [id number]  (Rx#: [id number], store#: [id number])  PA dept#: [phone] (Member#: [id number], group#: [id number])

## 2023-01-24 NOTE — Telephone Encounter (Signed)
[employer] PA submitted with CMM for Repatha    Gregory Santiago (Key: [facility]) Rx #: [id number] Repatha SureClick '140MG'$ /ML auto-injectors  Wait for Determination Please wait for [employer] 2017 [employer] to return a determination.

## 2023-01-24 NOTE — Telephone Encounter (Signed)
Request Reference Number: [id number]. REPATHA SURE INJ '140MG'$ /ML is approved through 07/25/2023. Your patient may now fill this prescription and it will be covered.

## 2023-04-17 ENCOUNTER — Other Ambulatory Visit: Payer: Self-pay | Admitting: Student

## 2023-04-17 DIAGNOSIS — M5126 Other intervertebral disc displacement, lumbar region: Secondary | ICD-10-CM

## 2023-05-02 ENCOUNTER — Ambulatory Visit (HOSPITAL_COMMUNITY)
Admission: RE | Admit: 2023-05-02 | Discharge: 2023-05-02 | Disposition: A | Discharge status: Home / Self Care | Payer: Medicare Other | Source: Ambulatory Visit | Attending: Neurosurgery | Admitting: Neurosurgery

## 2023-05-02 ENCOUNTER — Other Ambulatory Visit (HOSPITAL_COMMUNITY): Payer: Self-pay | Admitting: Neurosurgery

## 2023-05-02 DIAGNOSIS — C7951 Secondary malignant neoplasm of bone: Secondary | ICD-10-CM

## 2023-05-02 DIAGNOSIS — C801 Malignant (primary) neoplasm, unspecified: Secondary | ICD-10-CM | POA: Insufficient documentation

## 2023-05-02 MED ORDER — GADOBUTROL 1 MMOL/ML IV SOLN
7.5000 mL | Freq: Once | INTRAVENOUS | Status: AC | PRN
  Administered 2023-05-02: 7.5 mL via INTRAVENOUS

## 2023-05-29 DIAGNOSIS — C089 Malignant neoplasm of major salivary gland, unspecified: Secondary | ICD-10-CM

## 2023-05-29 DIAGNOSIS — G893 Neoplasm related pain (acute) (chronic): Secondary | ICD-10-CM | POA: Insufficient documentation

## 2023-05-29 HISTORY — DX: Malignant neoplasm of major salivary gland, unspecified: C08.9

## 2023-06-16 ENCOUNTER — Other Ambulatory Visit: Payer: Self-pay | Admitting: Internal Medicine

## 2023-06-17 ENCOUNTER — Encounter: Payer: Self-pay | Admitting: Internal Medicine

## 2023-06-17 MED ORDER — MELOXICAM 15 MG PO TABS: 15.0000 mg | ORAL_TABLET | Freq: Every day | ORAL | 0 refills | Status: DC

## 2023-06-25 ENCOUNTER — Other Ambulatory Visit (HOSPITAL_COMMUNITY): Payer: Self-pay | Admitting: Student

## 2023-06-25 DIAGNOSIS — C7951 Secondary malignant neoplasm of bone: Secondary | ICD-10-CM

## 2023-06-26 ENCOUNTER — Ambulatory Visit (HOSPITAL_COMMUNITY)
Admission: RE | Admit: 2023-06-26 | Discharge: 2023-06-26 | Disposition: A | Discharge status: Home / Self Care | Payer: Medicare Other | Source: Ambulatory Visit | Attending: Student | Admitting: Student

## 2023-06-26 ENCOUNTER — Telehealth: Payer: Self-pay

## 2023-06-26 DIAGNOSIS — C7951 Secondary malignant neoplasm of bone: Secondary | ICD-10-CM | POA: Diagnosis present

## 2023-06-26 DIAGNOSIS — C801 Malignant (primary) neoplasm, unspecified: Secondary | ICD-10-CM | POA: Insufficient documentation

## 2023-06-26 MED ORDER — GADOBUTROL 1 MMOL/ML IV SOLN
8.0000 mL | Freq: Once | INTRAVENOUS | Status: AC | PRN
  Administered 2023-06-26: 8 mL via INTRAVENOUS

## 2023-06-26 NOTE — Telephone Encounter (Signed)
Needs PA for Repatha.  Key [id number] Last name: Gregory Santiago DOB: 05-16-1951

## 2023-06-28 ENCOUNTER — Other Ambulatory Visit (HOSPITAL_COMMUNITY): Payer: Self-pay

## 2023-06-28 NOTE — Telephone Encounter (Signed)
Per test claim no PA required. Refill is too soon. Insurance will pay again after 07/04/23

## 2023-07-02 ENCOUNTER — Other Ambulatory Visit (HOSPITAL_COMMUNITY): Payer: Self-pay | Admitting: Student

## 2023-07-02 ENCOUNTER — Ambulatory Visit (HOSPITAL_COMMUNITY)
Admission: RE | Admit: 2023-07-02 | Discharge: 2023-07-02 | Disposition: A | Discharge status: Home / Self Care | Payer: Medicare Other | Source: Ambulatory Visit | Attending: Student | Admitting: Student

## 2023-07-02 DIAGNOSIS — M25552 Pain in left hip: Secondary | ICD-10-CM | POA: Diagnosis not present

## 2023-07-02 MED ORDER — GADOBUTROL 1 MMOL/ML IV SOLN
8.0000 mL | Freq: Once | INTRAVENOUS | Status: AC | PRN
  Administered 2023-07-02: 8 mL via INTRAVENOUS

## 2023-07-24 ENCOUNTER — Encounter (INDEPENDENT_AMBULATORY_CARE_PROVIDER_SITE_OTHER): Payer: Self-pay

## 2023-07-25 ENCOUNTER — Other Ambulatory Visit (HOSPITAL_BASED_OUTPATIENT_CLINIC_OR_DEPARTMENT_OTHER): Payer: Self-pay | Admitting: Neurosurgery

## 2023-07-25 DIAGNOSIS — M4856XA Collapsed vertebra, not elsewhere classified, lumbar region, initial encounter for fracture: Secondary | ICD-10-CM

## 2023-07-27 ENCOUNTER — Ambulatory Visit (HOSPITAL_BASED_OUTPATIENT_CLINIC_OR_DEPARTMENT_OTHER)
Admission: RE | Admit: 2023-07-27 | Discharge: 2023-07-27 | Disposition: A | Discharge status: Home / Self Care | Payer: Medicare Other | Source: Ambulatory Visit | Attending: Neurosurgery | Admitting: Neurosurgery

## 2023-07-27 DIAGNOSIS — M4856XA Collapsed vertebra, not elsewhere classified, lumbar region, initial encounter for fracture: Secondary | ICD-10-CM

## 2023-07-28 ENCOUNTER — Encounter: Payer: Self-pay | Admitting: Cardiology

## 2023-07-31 ENCOUNTER — Other Ambulatory Visit: Payer: Self-pay

## 2023-07-31 MED ORDER — ROSUVASTATIN CALCIUM 10 MG PO TABS: 10.0000 mg | ORAL_TABLET | Freq: Every day | ORAL | 3 refills | Status: DC

## 2023-08-01 ENCOUNTER — Other Ambulatory Visit: Payer: Self-pay

## 2023-08-05 NOTE — Progress Notes (Unsigned)
Subjective:    Patient ID: Gregory Santiago, male    DOB: March 04, 1951, 72 y.o.   MRN: [id number]     HPI Gregory Santiago is here for follow up of his chronic medical problems.  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  Had vertebral fracture s/p kyphoplasty and radiation.  Still having significant spinal pain-currently on steroids which is helping and chronic pain medication.  Has started going to [facility] and has been enrolled in a clinical trial-to start Lupron injections and will have to go to [facility] every 28 days for blood work and an injection.  He will also be starting a pill- Darolutamide  Has been experiencing some constipation with pain medication- On colace and miralax.  BM but not daily.    Medications and allergies reviewed with patient and updated if appropriate.  Current Outpatient Medications on File Prior to Visit  Medication Sig Dispense Refill   acetaminophen (TYLENOL) 500 MG tablet Take 1,000 mg by mouth every 6 (six) hours as needed for mild pain, moderate pain, fever or headache.     alfuzosin (UROXATRAL) 10 MG 24 hr tablet Take by mouth.     Carboxymethylcellulose Sod PF (REFRESH CELLUVISC) 1 % GEL Apply 1 Dose to eye at bedtime.     cyclobenzaprine (FLEXERIL) 5 MG tablet TAKE 1 TABLET THREE TIMES A DAY AS NEEDED FOR MUSCLE SPASMS 90 tablet 1   Evolocumab (REPATHA SURECLICK) 140 MG/ML SOAJ INJECT 1 DOSE UNDER THE SKIN EVERY 14 DAYS 6 mL 3   fluticasone (FLONASE) 50 MCG/ACT nasal spray Place 2 sprays into both nostrils at bedtime.     gabapentin (NEURONTIN) 300 MG capsule Take by mouth.     hydrocortisone (ANUSOL-HC) 2.5 % rectal cream Place 1 Application rectally 2 (two) times daily as needed for hemorrhoids or anal itching. 30 g 5   HYDROmorphone (DILAUDID) 2 MG tablet Take 2 mg by mouth every 4 (four) hours as needed.     ibuprofen (ADVIL) 200 MG tablet Take by mouth.     meloxicam (MOBIC) 15 MG tablet Take 1  tablet (15 mg total) by mouth daily. 90 tablet 0   morphine (MS CONTIN) 15 MG 12 hr tablet Take 15 mg by mouth 3 (three) times daily.     Multiple Vitamin (MULTIVITAMIN) tablet Take 1 tablet by mouth daily.     omeprazole (PRILOSEC) 40 MG capsule TAKE 1 CAPSULE DAILY 90 capsule 2   ondansetron (ZOFRAN-ODT) 4 MG disintegrating tablet Take 1 tablet (4 mg total) by mouth every 8 (eight) hours as needed for nausea or vomiting. 20 tablet 0   Polyvinyl Alcohol-Povidone PF (REFRESH) 1.4-0.6 % SOLN Apply 2 drops to eye daily as needed (right eye dryness, unable to close right eye).     prednisoLONE 5 MG TABS tablet Take by mouth as directed.     rosuvastatin (CRESTOR) 10 MG tablet Take 1 tablet (10 mg total) by mouth daily. 90 tablet 3   silodosin (RAPAFLO) 8 MG CAPS capsule Take 8 mg by mouth daily.     sodium fluoride (FLUORISHIELD) 1.1 % GEL dental gel USE ONCE DAILY AFTER BRUSHING. PLACE IN TRAY FOR 5 MINUTES. DO NOT RINSE OR EAT 30 MINUTES AFTER APPLICATION     LYCOPENE PO Take 1 tablet by mouth daily. (Patient not taking: Reported on 08/06/2023)     methylPREDNISolone (MEDROL DOSEPAK) 4 MG TBPK tablet See admin instructions. (Patient not taking: Reported on 08/06/2023)  No current facility-administered medications on file prior to visit.     Review of Systems  Constitutional:        Appetite improved  Respiratory:  Negative for cough, shortness of breath and wheezing.   Cardiovascular:  Negative for chest pain, palpitations and leg swelling.  Gastrointestinal:  Positive for constipation.  Neurological:  Negative for light-headedness and headaches.       Objective:   Vitals:   08/06/23 1016  BP: 128/80  Pulse: 71  Temp: (!) 79.9 F (26.6 C)  SpO2: 99%   BP Readings from Last 3 Encounters:  08/06/23 128/80  10/22/22 130/78  09/03/22 114/79   Wt Readings from Last 3 Encounters:  08/06/23 181 lb (82.1 kg)  10/22/22 180 lb (81.6 kg)  07/24/22 173 lb 9.6 oz (78.7 kg)   Body  mass index is 25.24 kg/m.    Physical Exam Constitutional:      General: He is not in acute distress.    Appearance: Normal appearance. He is not ill-appearing.  HENT:     Head: Normocephalic and atraumatic.  Eyes:     Conjunctiva/sclera: Conjunctivae normal.  Cardiovascular:     Rate and Rhythm: Normal rate and regular rhythm.     Heart sounds: Normal heart sounds.  Pulmonary:     Effort: Pulmonary effort is normal. No respiratory distress.     Breath sounds: Normal breath sounds. No wheezing or rales.  Musculoskeletal:     Right lower leg: No edema.     Left lower leg: No edema.  Skin:    General: Skin is warm and dry.     Findings: No rash.  Neurological:     Mental Status: He is alert. Mental status is at baseline.  Psychiatric:        Mood and Affect: Mood normal.        Lab Results  Component Value Date   WBC 5.2 07/24/2022   HGB 13.7 07/24/2022   HCT 39.2 07/24/2022   PLT 183 07/24/2022   GLUCOSE 98 10/22/2022   CHOL 97 (L) 10/22/2022   TRIG 90 10/22/2022   HDL 49 10/22/2022   LDLDIRECT 155.9 01/29/2014   LDLCALC 30 10/22/2022   ALT 16 10/22/2022   AST 17 10/22/2022   NA 140 10/22/2022   K 4.5 10/22/2022   CL 105 10/22/2022   CREATININE 0.93 10/22/2022   BUN 15 10/22/2022   CO2 25 10/22/2022   TSH 1.974 07/24/2022   PSA 0.02 (L) 06/03/2020   HGBA1C 5.5 08/07/2021     Assessment & Plan:    See Problem List for Assessment and Plan of chronic medical problems.

## 2023-08-05 NOTE — Patient Instructions (Incomplete)
      Medications changes include :  none         Return in about 1 year (around 08/05/2024) for follow up.

## 2023-08-06 ENCOUNTER — Encounter: Payer: Self-pay | Admitting: Internal Medicine

## 2023-08-06 ENCOUNTER — Ambulatory Visit (INDEPENDENT_AMBULATORY_CARE_PROVIDER_SITE_OTHER): Payer: Medicare Other | Admitting: Internal Medicine

## 2023-08-06 VITALS — BP 128/80 | HR 71 | Temp 79.9°F | Wt 181.0 lb

## 2023-08-06 DIAGNOSIS — C7951 Secondary malignant neoplasm of bone: Secondary | ICD-10-CM

## 2023-08-06 DIAGNOSIS — M150 Primary generalized (osteo)arthritis: Secondary | ICD-10-CM

## 2023-08-06 DIAGNOSIS — M159 Polyosteoarthritis, unspecified: Secondary | ICD-10-CM

## 2023-08-06 DIAGNOSIS — M62838 Other muscle spasm: Secondary | ICD-10-CM | POA: Diagnosis not present

## 2023-08-06 DIAGNOSIS — K219 Gastro-esophageal reflux disease without esophagitis: Secondary | ICD-10-CM | POA: Diagnosis not present

## 2023-08-06 MED ORDER — CYCLOBENZAPRINE HCL 5 MG PO TABS: ORAL_TABLET | ORAL | 3 refills | Status: DC

## 2023-08-06 MED ORDER — MELOXICAM 15 MG PO TABS: 15.0000 mg | ORAL_TABLET | Freq: Every day | ORAL | 3 refills | Status: DC

## 2023-08-06 MED ORDER — OMEPRAZOLE 40 MG PO CPDR: DELAYED_RELEASE_CAPSULE | ORAL | 3 refills | Status: DC

## 2023-08-06 NOTE — Assessment & Plan Note (Signed)
Chronic GERD controlled Continue omeprazole 40 mg daily 

## 2023-08-06 NOTE — Assessment & Plan Note (Addendum)
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

## 2023-08-06 NOTE — Assessment & Plan Note (Addendum)
Chronic Intermittent Continue flexeril 5 mg daily as needed

## 2023-08-06 NOTE — Assessment & Plan Note (Addendum)
Chronic Taking meloxicam 15 mg daily Tylenol as needed

## 2023-08-13 ENCOUNTER — Telehealth: Payer: Self-pay

## 2023-08-13 ENCOUNTER — Other Ambulatory Visit (HOSPITAL_COMMUNITY): Payer: Self-pay

## 2023-08-13 ENCOUNTER — Telehealth: Payer: Self-pay | Admitting: Cardiology

## 2023-08-13 NOTE — Telephone Encounter (Signed)
PA request has been Submitted. New Encounter created for follow up. For additional info see Pharmacy Prior Auth telephone encounter from 08/13/23.

## 2023-08-13 NOTE — Telephone Encounter (Signed)
Pharmacy Patient Advocate Encounter   Received notification from Physician's Office that prior authorization for REPATHA is required/requested.   Insurance verification completed.   The patient is insured through  [facility]  .   Per test claim: PA required; PA submitted to [facility] MEDICARE via CoverMyMeds Key/confirmation #/EOC [id number] Status is pending

## 2023-08-13 NOTE — Telephone Encounter (Signed)
Follow Up:      Gregory Santiago is calling to check on patient's prior authorization for Repatha Sure Click.

## 2023-08-13 NOTE — Telephone Encounter (Signed)
Pharmacy Patient Advocate Encounter  Received notification from  [facility] MEDICARE  that Prior Authorization for REPATHA has been APPROVED from 08/13/23 to 12/24/23. Ran test claim, Copay is $33 FOR 6 MONTHS. This test claim was processed through [facility]- copay amounts may vary at other pharmacies due to pharmacy/plan contracts, or as the patient moves through the different stages of their insurance plan.

## 2023-09-13 ENCOUNTER — Other Ambulatory Visit: Payer: Self-pay | Admitting: Neurosurgery

## 2023-09-13 DIAGNOSIS — C7951 Secondary malignant neoplasm of bone: Secondary | ICD-10-CM

## 2023-09-20 NOTE — Discharge Instructions (Signed)

## 2023-09-23 ENCOUNTER — Other Ambulatory Visit: Payer: Self-pay | Admitting: Neurosurgery

## 2023-09-23 ENCOUNTER — Ambulatory Visit
Admission: RE | Admit: 2023-09-23 | Discharge: 2023-09-23 | Disposition: A | Discharge status: Home / Self Care | Payer: Medicare Other | Source: Ambulatory Visit | Attending: Neurosurgery | Admitting: Neurosurgery

## 2023-09-23 DIAGNOSIS — C7951 Secondary malignant neoplasm of bone: Secondary | ICD-10-CM

## 2023-09-23 MED ORDER — METHYLPREDNISOLONE ACETATE 40 MG/ML INJ SUSP (RADIOLOG
80.0000 mg | Freq: Once | INTRAMUSCULAR | Status: AC
  Administered 2023-09-23: 80 mg via EPIDURAL

## 2023-09-23 MED ORDER — IOPAMIDOL (ISOVUE-M 200) INJECTION 41%
1.0000 mL | Freq: Once | INTRAMUSCULAR | Status: AC
  Administered 2023-09-23: 1 mL via EPIDURAL

## 2023-10-12 ENCOUNTER — Other Ambulatory Visit: Payer: Self-pay | Admitting: Cardiology

## 2023-10-23 NOTE — Progress Notes (Unsigned)
headache.   alfuzosin (UROXATRAL) 10 MG 24 hr tablet Take by mouth.   Carboxymethylcellulose Sod PF (REFRESH CELLUVISC) 1 % GEL Apply 1 Dose to eye at bedtime.   cyclobenzaprine (FLEXERIL) 5 MG tablet TAKE 1 TABLET THREE TIMES A DAY AS NEEDED FOR MUSCLE SPASMS   Evolocumab (REPATHA SURECLICK) 140 MG/ML SOAJ Inject 140 mg into the skin every 14 (fourteen) days. Patient must keep appointment for 10-24-23 for further refills. 1 st attempt   fluticasone (FLONASE) 50 MCG/ACT nasal spray Place 2 sprays  into both nostrils at bedtime.   gabapentin (NEURONTIN) 300 MG capsule Take by mouth.   hydrocortisone (ANUSOL-HC) 2.5 % rectal cream Place 1 Application rectally 2 (two) times daily as needed for hemorrhoids or anal itching.   ibuprofen (ADVIL) 200 MG tablet Take by mouth.   meloxicam (MOBIC) 15 MG tablet Take 1 tablet (15 mg total) by mouth daily. Take with food.   Multiple Vitamin (MULTIVITAMIN) tablet Take 1 tablet by mouth daily.   omeprazole (PRILOSEC) 40 MG capsule TAKE 1 CAPSULE DAILY   Polyvinyl Alcohol-Povidone PF (REFRESH) 1.4-0.6 % SOLN Apply 2 drops to eye daily as needed (right eye dryness, unable to close right eye).   rosuvastatin (CRESTOR) 10 MG tablet Take 1 tablet (10 mg total) by mouth daily. (Patient taking differently: Take 5 mg by mouth daily.)   sodium fluoride (FLUORISHIELD) 1.1 % GEL dental gel Place 1 Application onto teeth at bedtime.   [DISCONTINUED] HYDROmorphone (DILAUDID) 2 MG tablet Take 2 mg by mouth every 4 (four) hours as needed.   [DISCONTINUED] LYCOPENE PO Take 1 tablet by mouth daily.   [DISCONTINUED] methylPREDNISolone (MEDROL DOSEPAK) 4 MG TBPK tablet See admin instructions.   [DISCONTINUED] morphine (MS CONTIN) 15 MG 12 hr tablet Take 15 mg by mouth 3 (three) times daily.   [DISCONTINUED] ondansetron (ZOFRAN-ODT) 4 MG disintegrating tablet Take 1 tablet (4 mg total) by mouth every 8 (eight) hours as needed for nausea or vomiting.   [DISCONTINUED] prednisoLONE 5 MG TABS tablet Take by mouth as directed.   [DISCONTINUED] silodosin (RAPAFLO) 8 MG CAPS capsule Take 8 mg by mouth daily.      EKGs/Labs/Other Studies Reviewed:    The following studies were reviewed today:  Cardiac Studies & Procedures     STRESS TESTS  MYOCARDIAL PERFUSION IMAGING 02/15/2021  Interpretation Summary  The left ventricular ejection fraction is mildly decreased (45-54%).  Nuclear stress EF: 54%.  There was no ST segment deviation noted during stress.  The study is  normal.  This is a low risk study.  No ischemia or infarction on perfusion images. Normal wall motion.   ECHOCARDIOGRAM  ECHOCARDIOGRAM COMPLETE 07/25/2022  Narrative ECHOCARDIOGRAM REPORT    Patient Name:   [facility] Date of Exam: 07/25/2022 Medical Rec #:  [id number]   Height:       71.0 in Accession #:    [id number]  Weight:       173.6 lb Date of Birth:  04-23-1951   BSA:          1.985 m Patient Age:    72 years    BP:           126/89 mmHg Patient Gender: M           HR:           52 bpm. Exam Location:  Inpatient  Procedure: 2D Echo, Cardiac Doppler and Color Doppler  Indications:    R01.1 Murmur  History:  Cardiology Office Note:    Date:  10/24/2023 got it yesterday  ID:  Gregory Santiago, DOB May 21, 1951, MRN [id number]  PCP:  Pincus Sanes, MD  Cardiologist:  Norman Herrlich, MD    Referring MD: Pincus Sanes, MD    ASSESSMENT:    1. Coronary artery calcification seen on CAT scan   2. Elevated lipoprotein(a)    PLAN:    In order of problems listed above:  Today check lipids LP(a) continue his current treatment generally quite important in patients with advanced cancer especially survivors   Next appointment: I will plan to see him as 1 year   Medication Adjustments/Labs and Tests Ordered: Current medicines are reviewed at length with the patient today.  Concerns regarding medicines are outlined above.  Orders Placed This Encounter  Procedures   Comp Met (CMET)   Lipid Profile   Lipoprotein A (LPA)   EKG 12-Lead   No orders of the defined types were placed in this encounter.    History of Present Illness:    Izick Hagey is a 72 y.o. 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  He is now receiving protocol therapy for his metastatic cancer at [facility] in [place] He tells me he has had a very good initial response and is quite motivated He continues on his combined lipid-lowering therapy Repatha plus a high intensity statin He is not having side effects we will go ahead and check his labs today including lipid profile LP(a) He has good healthcare literacy and tells me he has no cardiac involvement from extensive imaging [place] although he has mediastinal and lung involvement along with low He is not having chest pain edema shortness of breath palpitation or syncope  Compliance with diet, lifestyle and medications: Yes Past  Medical History:  Diagnosis Date   Allergic rhinitis    Allergy    seasonal allergies   AR (allergic rhinitis) 10/28/2018   Arthritis    generalized   Blood transfusion without reported diagnosis 1996   Coronary artery calcification seen on CAT scan 05/22/2019   Cranial nerve VII palsy    Duodenitis    Ear pressure, right 02/27/2019   Family history of diabetes mellitus in mother 05/21/2019   Fundic gland polyps of stomach, benign    GERD (gastroesophageal reflux disease)    on meds   Hemorrhoids    Hepatitis A    as a child   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   HLD (hyperlipidemia)    on meds   Knee pain 04/23/2018   Oropharyngeal dysphagia 06/02/2019   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  Cardiology Office Note:    Date:  10/24/2023 got it yesterday  ID:  Gregory Santiago, DOB May 21, 1951, MRN [id number]  PCP:  Pincus Sanes, MD  Cardiologist:  Norman Herrlich, MD    Referring MD: Pincus Sanes, MD    ASSESSMENT:    1. Coronary artery calcification seen on CAT scan   2. Elevated lipoprotein(a)    PLAN:    In order of problems listed above:  Today check lipids LP(a) continue his current treatment generally quite important in patients with advanced cancer especially survivors   Next appointment: I will plan to see him as 1 year   Medication Adjustments/Labs and Tests Ordered: Current medicines are reviewed at length with the patient today.  Concerns regarding medicines are outlined above.  Orders Placed This Encounter  Procedures   Comp Met (CMET)   Lipid Profile   Lipoprotein A (LPA)   EKG 12-Lead   No orders of the defined types were placed in this encounter.    History of Present Illness:    Izick Hagey is a 72 y.o. 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  He is now receiving protocol therapy for his metastatic cancer at [facility] in [place] He tells me he has had a very good initial response and is quite motivated He continues on his combined lipid-lowering therapy Repatha plus a high intensity statin He is not having side effects we will go ahead and check his labs today including lipid profile LP(a) He has good healthcare literacy and tells me he has no cardiac involvement from extensive imaging [place] although he has mediastinal and lung involvement along with low He is not having chest pain edema shortness of breath palpitation or syncope  Compliance with diet, lifestyle and medications: Yes Past  Medical History:  Diagnosis Date   Allergic rhinitis    Allergy    seasonal allergies   AR (allergic rhinitis) 10/28/2018   Arthritis    generalized   Blood transfusion without reported diagnosis 1996   Coronary artery calcification seen on CAT scan 05/22/2019   Cranial nerve VII palsy    Duodenitis    Ear pressure, right 02/27/2019   Family history of diabetes mellitus in mother 05/21/2019   Fundic gland polyps of stomach, benign    GERD (gastroesophageal reflux disease)    on meds   Hemorrhoids    Hepatitis A    as a child   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   HLD (hyperlipidemia)    on meds   Knee pain 04/23/2018   Oropharyngeal dysphagia 06/02/2019   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   alfuzosin (UROXATRAL) 10 MG 24 hr tablet Take by mouth.   Carboxymethylcellulose Sod PF (REFRESH CELLUVISC) 1 % GEL Apply 1 Dose to eye at bedtime.   cyclobenzaprine (FLEXERIL) 5 MG tablet TAKE 1 TABLET THREE TIMES A DAY AS NEEDED FOR MUSCLE SPASMS   Evolocumab (REPATHA SURECLICK) 140 MG/ML SOAJ Inject 140 mg into the skin every 14 (fourteen) days. Patient must keep appointment for 10-24-23 for further refills. 1 st attempt   fluticasone (FLONASE) 50 MCG/ACT nasal spray Place 2 sprays  into both nostrils at bedtime.   gabapentin (NEURONTIN) 300 MG capsule Take by mouth.   hydrocortisone (ANUSOL-HC) 2.5 % rectal cream Place 1 Application rectally 2 (two) times daily as needed for hemorrhoids or anal itching.   ibuprofen (ADVIL) 200 MG tablet Take by mouth.   meloxicam (MOBIC) 15 MG tablet Take 1 tablet (15 mg total) by mouth daily. Take with food.   Multiple Vitamin (MULTIVITAMIN) tablet Take 1 tablet by mouth daily.   omeprazole (PRILOSEC) 40 MG capsule TAKE 1 CAPSULE DAILY   Polyvinyl Alcohol-Povidone PF (REFRESH) 1.4-0.6 % SOLN Apply 2 drops to eye daily as needed (right eye dryness, unable to close right eye).   rosuvastatin (CRESTOR) 10 MG tablet Take 1 tablet (10 mg total) by mouth daily. (Patient taking differently: Take 5 mg by mouth daily.)   sodium fluoride (FLUORISHIELD) 1.1 % GEL dental gel Place 1 Application onto teeth at bedtime.   [DISCONTINUED] HYDROmorphone (DILAUDID) 2 MG tablet Take 2 mg by mouth every 4 (four) hours as needed.   [DISCONTINUED] LYCOPENE PO Take 1 tablet by mouth daily.   [DISCONTINUED] methylPREDNISolone (MEDROL DOSEPAK) 4 MG TBPK tablet See admin instructions.   [DISCONTINUED] morphine (MS CONTIN) 15 MG 12 hr tablet Take 15 mg by mouth 3 (three) times daily.   [DISCONTINUED] ondansetron (ZOFRAN-ODT) 4 MG disintegrating tablet Take 1 tablet (4 mg total) by mouth every 8 (eight) hours as needed for nausea or vomiting.   [DISCONTINUED] prednisoLONE 5 MG TABS tablet Take by mouth as directed.   [DISCONTINUED] silodosin (RAPAFLO) 8 MG CAPS capsule Take 8 mg by mouth daily.      EKGs/Labs/Other Studies Reviewed:    The following studies were reviewed today:  Cardiac Studies & Procedures     STRESS TESTS  MYOCARDIAL PERFUSION IMAGING 02/15/2021  Interpretation Summary  The left ventricular ejection fraction is mildly decreased (45-54%).  Nuclear stress EF: 54%.  There was no ST segment deviation noted during stress.  The study is  normal.  This is a low risk study.  No ischemia or infarction on perfusion images. Normal wall motion.   ECHOCARDIOGRAM  ECHOCARDIOGRAM COMPLETE 07/25/2022  Narrative ECHOCARDIOGRAM REPORT    Patient Name:   [facility] Date of Exam: 07/25/2022 Medical Rec #:  [id number]   Height:       71.0 in Accession #:    [id number]  Weight:       173.6 lb Date of Birth:  04-23-1951   BSA:          1.985 m Patient Age:    72 years    BP:           126/89 mmHg Patient Gender: M           HR:           52 bpm. Exam Location:  Inpatient  Procedure: 2D Echo, Cardiac Doppler and Color Doppler  Indications:    R01.1 Murmur  History:  Cardiology Office Note:    Date:  10/24/2023 got it yesterday  ID:  Gregory Santiago, DOB May 21, 1951, MRN [id number]  PCP:  Pincus Sanes, MD  Cardiologist:  Norman Herrlich, MD    Referring MD: Pincus Sanes, MD    ASSESSMENT:    1. Coronary artery calcification seen on CAT scan   2. Elevated lipoprotein(a)    PLAN:    In order of problems listed above:  Today check lipids LP(a) continue his current treatment generally quite important in patients with advanced cancer especially survivors   Next appointment: I will plan to see him as 1 year   Medication Adjustments/Labs and Tests Ordered: Current medicines are reviewed at length with the patient today.  Concerns regarding medicines are outlined above.  Orders Placed This Encounter  Procedures   Comp Met (CMET)   Lipid Profile   Lipoprotein A (LPA)   EKG 12-Lead   No orders of the defined types were placed in this encounter.    History of Present Illness:    Izick Hagey is a 72 y.o. 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  He is now receiving protocol therapy for his metastatic cancer at [facility] in [place] He tells me he has had a very good initial response and is quite motivated He continues on his combined lipid-lowering therapy Repatha plus a high intensity statin He is not having side effects we will go ahead and check his labs today including lipid profile LP(a) He has good healthcare literacy and tells me he has no cardiac involvement from extensive imaging [place] although he has mediastinal and lung involvement along with low He is not having chest pain edema shortness of breath palpitation or syncope  Compliance with diet, lifestyle and medications: Yes Past  Medical History:  Diagnosis Date   Allergic rhinitis    Allergy    seasonal allergies   AR (allergic rhinitis) 10/28/2018   Arthritis    generalized   Blood transfusion without reported diagnosis 1996   Coronary artery calcification seen on CAT scan 05/22/2019   Cranial nerve VII palsy    Duodenitis    Ear pressure, right 02/27/2019   Family history of diabetes mellitus in mother 05/21/2019   Fundic gland polyps of stomach, benign    GERD (gastroesophageal reflux disease)    on meds   Hemorrhoids    Hepatitis A    as a child   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   HLD (hyperlipidemia)    on meds   Knee pain 04/23/2018   Oropharyngeal dysphagia 06/02/2019   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

## 2023-10-24 ENCOUNTER — Telehealth: Payer: Self-pay | Admitting: Cardiology

## 2023-10-24 ENCOUNTER — Ambulatory Visit: Payer: Medicare Other | Attending: Cardiology | Admitting: Cardiology

## 2023-10-24 ENCOUNTER — Encounter: Payer: Self-pay | Admitting: Cardiology

## 2023-10-24 VITALS — BP 130/90 | HR 68 | Ht 69.0 in | Wt 179.6 lb

## 2023-10-24 DIAGNOSIS — I2510 Atherosclerotic heart disease of native coronary artery without angina pectoris: Secondary | ICD-10-CM | POA: Diagnosis present

## 2023-10-24 DIAGNOSIS — E7841 Elevated Lipoprotein(a): Secondary | ICD-10-CM | POA: Diagnosis present

## 2023-10-24 MED ORDER — ROSUVASTATIN CALCIUM 5 MG PO TABS: 5.0000 mg | ORAL_TABLET | Freq: Every day | ORAL | 3 refills | Status: DC

## 2023-10-24 NOTE — Telephone Encounter (Signed)
Pt c/o medication issue:  1. Name of Medication:  rosuvastatin (CRESTOR) 10 MG tablet   2. How are you currently taking this medication (dosage and times per day)?   3. Are you having a reaction (difficulty breathing--STAT)?   4. What is your medication issue?   Patient states his paperwork instructs to take 10 MG daily, but during his appointment he discussed taking 5 MG and he would like to have this corrected.

## 2023-10-24 NOTE — Patient Instructions (Signed)
Medication Instructions:  Your physician recommends that you continue on your current medications as directed. Please refer to the Current Medication list given to you today.  *If you need a refill on your cardiac medications before your next appointment, please call your pharmacy*   Lab Work: Your physician recommends that you return for lab work in:   Labs today: CMP, lipid, lpa  If you have labs (blood work) drawn today and your tests are completely normal, you will receive your results only by: MyChart Message (if you have MyChart) OR A paper copy in the mail If you have any lab test that is abnormal or we need to change your treatment, we will call you to review the results.   Testing/Procedures: None   Follow-Up: At [facility], you and your health needs are our priority.  As part of our continuing mission to provide you with exceptional heart care, we have created designated Provider Care Teams.  These Care Teams include your primary Cardiologist (physician) and Advanced Practice Providers (APPs -  Physician Assistants and Nurse Practitioners) who all work together to provide you with the care you need, when you need it.  We recommend signing up for the patient portal called "MyChart".  Sign up information is provided on this After Visit Summary.  MyChart is used to connect with patients for Virtual Visits (Telemedicine).  Patients are able to view lab/test results, encounter notes, upcoming appointments, etc.  Non-urgent messages can be sent to your provider as well.   To learn more about what you can do with MyChart, go to [email or web address].    Your next appointment:   1 year(s)  Provider:   Norman Herrlich, MD    Other Instructions None

## 2023-10-24 NOTE — Telephone Encounter (Signed)
Patient states that he has been taking 5mg  instead of 10mg . So just want to confirm to take 10mg 

## 2023-10-25 ENCOUNTER — Encounter: Payer: Self-pay | Admitting: Cardiology

## 2023-10-25 LAB — COMPREHENSIVE METABOLIC PANEL
ALT: 14 [IU]/L (ref 0–44)
AST: 13 [IU]/L (ref 0–40)
Albumin: 4.6 g/dL (ref 3.8–4.8)
Alkaline Phosphatase: 106 [IU]/L (ref 44–121)
BUN/Creatinine Ratio: 20 (ref 10–24)
BUN: 14 mg/dL (ref 8–27)
Bilirubin Total: 0.3 mg/dL (ref 0.0–1.2)
CO2: 25 mmol/L (ref 20–29)
Calcium: 9.4 mg/dL (ref 8.6–10.2)
Chloride: 105 mmol/L (ref 96–106)
Creatinine, Ser: 0.69 mg/dL — ABNORMAL LOW (ref 0.76–1.27)
Globulin, Total: 2 g/dL (ref 1.5–4.5)
Glucose: 102 mg/dL — ABNORMAL HIGH (ref 70–99)
Potassium: 5 mmol/L (ref 3.5–5.2)
Sodium: 141 mmol/L (ref 134–144)
Total Protein: 6.6 g/dL (ref 6.0–8.5)
eGFR: 99 mL/min/{1.73_m2} (ref >59)

## 2023-10-25 LAB — LIPID PANEL
Chol/HDL Ratio: 2.1 ratio (ref 0.0–5.0)
Cholesterol, Total: 105 mg/dL (ref 100–199)
HDL: 49 mg/dL (ref >39)
LDL Chol Calc (NIH): 22 mg/dL (ref 0–99)
Triglycerides: 223 mg/dL — ABNORMAL HIGH (ref 0–149)
VLDL Cholesterol Cal: 34 mg/dL (ref 5–40)

## 2023-10-25 LAB — LIPOPROTEIN A (LPA): Lipoprotein (a): 214.2 nmol/L — ABNORMAL HIGH (ref <75.0)

## 2023-11-11 ENCOUNTER — Encounter: Payer: Self-pay | Admitting: Cardiology

## 2023-11-11 DIAGNOSIS — I2510 Atherosclerotic heart disease of native coronary artery without angina pectoris: Secondary | ICD-10-CM

## 2023-11-11 DIAGNOSIS — I6523 Occlusion and stenosis of bilateral carotid arteries: Secondary | ICD-10-CM

## 2023-11-11 DIAGNOSIS — R931 Abnormal findings on diagnostic imaging of heart and coronary circulation: Secondary | ICD-10-CM

## 2023-11-11 DIAGNOSIS — E7841 Elevated Lipoprotein(a): Secondary | ICD-10-CM

## 2024-01-01 DIAGNOSIS — M810 Age-related osteoporosis without current pathological fracture: Secondary | ICD-10-CM | POA: Insufficient documentation

## 2024-01-08 NOTE — Addendum Note (Signed)
 Addended by: Einar Grave on: 01/08/2024 08:21 AM   Modules accepted: Orders

## 2024-01-09 ENCOUNTER — Encounter: Payer: Self-pay | Admitting: Cardiology

## 2024-01-09 LAB — LIPID PANEL
Chol/HDL Ratio: 2 {ratio} (ref 0.0–5.0)
Cholesterol, Total: 93 mg/dL — ABNORMAL LOW (ref 100–199)
HDL: 47 mg/dL (ref >39)
LDL Chol Calc (NIH): 26 mg/dL (ref 0–99)
Triglycerides: 107 mg/dL (ref 0–149)
VLDL Cholesterol Cal: 20 mg/dL (ref 5–40)

## 2024-01-24 ENCOUNTER — Other Ambulatory Visit: Payer: Self-pay | Admitting: Cardiology

## 2024-01-27 NOTE — Telephone Encounter (Signed)
 Rx refill sent to pharmacy.

## 2024-02-05 ENCOUNTER — Telehealth: Payer: Self-pay | Admitting: Cardiology

## 2024-02-05 DIAGNOSIS — I2510 Atherosclerotic heart disease of native coronary artery without angina pectoris: Secondary | ICD-10-CM

## 2024-02-05 NOTE — Telephone Encounter (Signed)
Pt spouse called in stating pt would like to be seen in Lipid clinic with Dr. Rennis Golden again. Informed her I spoke to Dr. Blanchie Dessert scheduler and since he has been seen there in over 3 years, he would need another referral placed for lipid clinic, please advise

## 2024-02-06 ENCOUNTER — Encounter: Payer: Self-pay | Admitting: Cardiology

## 2024-02-06 DIAGNOSIS — E7841 Elevated Lipoprotein(a): Secondary | ICD-10-CM

## 2024-02-10 NOTE — Addendum Note (Signed)
 Addended by: Eleonore Chiquito on: 02/10/2024 10:26 AM   Modules accepted: Orders

## 2024-02-19 ENCOUNTER — Other Ambulatory Visit: Payer: Self-pay | Admitting: Cardiology

## 2024-02-20 NOTE — Telephone Encounter (Signed)
 Prescription sent to pharmacy.

## 2024-03-12 ENCOUNTER — Other Ambulatory Visit: Payer: Self-pay | Admitting: Neurosurgery

## 2024-03-12 DIAGNOSIS — M4856XA Collapsed vertebra, not elsewhere classified, lumbar region, initial encounter for fracture: Secondary | ICD-10-CM

## 2024-03-27 NOTE — Discharge Instructions (Signed)

## 2024-03-30 ENCOUNTER — Ambulatory Visit
Admission: RE | Admit: 2024-03-30 | Discharge: 2024-03-30 | Disposition: A | Discharge status: Home / Self Care | Source: Ambulatory Visit | Attending: Neurosurgery | Admitting: Neurosurgery

## 2024-03-30 ENCOUNTER — Other Ambulatory Visit: Payer: Self-pay | Admitting: Neurosurgery

## 2024-03-30 ENCOUNTER — Encounter: Payer: Self-pay | Admitting: Cardiology

## 2024-03-30 DIAGNOSIS — M4856XA Collapsed vertebra, not elsewhere classified, lumbar region, initial encounter for fracture: Secondary | ICD-10-CM

## 2024-03-30 DIAGNOSIS — I6523 Occlusion and stenosis of bilateral carotid arteries: Secondary | ICD-10-CM

## 2024-03-30 DIAGNOSIS — E7841 Elevated Lipoprotein(a): Secondary | ICD-10-CM

## 2024-03-30 DIAGNOSIS — R931 Abnormal findings on diagnostic imaging of heart and coronary circulation: Secondary | ICD-10-CM

## 2024-03-30 DIAGNOSIS — I2510 Atherosclerotic heart disease of native coronary artery without angina pectoris: Secondary | ICD-10-CM

## 2024-03-30 MED ORDER — METHYLPREDNISOLONE ACETATE 40 MG/ML INJ SUSP (RADIOLOG
80.0000 mg | Freq: Once | INTRAMUSCULAR | Status: AC
  Administered 2024-03-30: 80 mg via EPIDURAL

## 2024-03-30 MED ORDER — IOPAMIDOL (ISOVUE-M 200) INJECTION 41%
1.0000 mL | Freq: Once | INTRAMUSCULAR | Status: AC
  Administered 2024-03-30: 1 mL via EPIDURAL

## 2024-04-01 LAB — LIPID PANEL
Chol/HDL Ratio: 2.9 ratio (ref 0.0–5.0)
Cholesterol, Total: 157 mg/dL (ref 100–199)
HDL: 55 mg/dL (ref >39)
LDL Chol Calc (NIH): 81 mg/dL (ref 0–99)
Triglycerides: 116 mg/dL (ref 0–149)
VLDL Cholesterol Cal: 21 mg/dL (ref 5–40)

## 2024-04-01 LAB — HEPATIC FUNCTION PANEL
ALT: 14 IU/L (ref 0–44)
AST: 17 IU/L (ref 0–40)
Albumin: 4.5 g/dL (ref 3.8–4.8)
Alkaline Phosphatase: 67 IU/L (ref 44–121)
Bilirubin Total: 0.3 mg/dL (ref 0.0–1.2)
Bilirubin, Direct: 0.1 mg/dL (ref 0.00–0.40)
Total Protein: 6.4 g/dL (ref 6.0–8.5)

## 2024-04-22 DIAGNOSIS — G4700 Insomnia, unspecified: Secondary | ICD-10-CM | POA: Insufficient documentation

## 2024-05-22 ENCOUNTER — Other Ambulatory Visit: Payer: Self-pay | Admitting: Cardiology

## 2024-06-25 ENCOUNTER — Other Ambulatory Visit (HOSPITAL_BASED_OUTPATIENT_CLINIC_OR_DEPARTMENT_OTHER): Payer: Self-pay | Admitting: Student

## 2024-06-25 DIAGNOSIS — M21379 Foot drop, unspecified foot: Secondary | ICD-10-CM

## 2024-07-01 ENCOUNTER — Ambulatory Visit (HOSPITAL_BASED_OUTPATIENT_CLINIC_OR_DEPARTMENT_OTHER)
Admission: RE | Admit: 2024-07-01 | Discharge: 2024-07-01 | Disposition: A | Discharge status: Home / Self Care | Source: Ambulatory Visit | Attending: Student | Admitting: Student

## 2024-07-01 ENCOUNTER — Ambulatory Visit (INDEPENDENT_AMBULATORY_CARE_PROVIDER_SITE_OTHER): Admitting: Internal Medicine

## 2024-07-01 ENCOUNTER — Encounter (HOSPITAL_BASED_OUTPATIENT_CLINIC_OR_DEPARTMENT_OTHER): Payer: Self-pay | Admitting: Internal Medicine

## 2024-07-01 VITALS — BP 128/84 | HR 94 | Ht 69.0 in | Wt 186.0 lb

## 2024-07-01 DIAGNOSIS — E7841 Elevated Lipoprotein(a): Secondary | ICD-10-CM | POA: Diagnosis not present

## 2024-07-01 DIAGNOSIS — I2510 Atherosclerotic heart disease of native coronary artery without angina pectoris: Secondary | ICD-10-CM

## 2024-07-01 DIAGNOSIS — M21379 Foot drop, unspecified foot: Secondary | ICD-10-CM | POA: Insufficient documentation

## 2024-07-01 DIAGNOSIS — I6523 Occlusion and stenosis of bilateral carotid arteries: Secondary | ICD-10-CM

## 2024-07-01 DIAGNOSIS — R931 Abnormal findings on diagnostic imaging of heart and coronary circulation: Secondary | ICD-10-CM

## 2024-07-01 MED ORDER — GADOPICLENOL 0.5 MMOL/ML IV SOLN
8.5000 mL | Freq: Once | INTRAVENOUS | Status: AC | PRN
  Administered 2024-07-01: 8.5 mL via INTRAVENOUS

## 2024-07-01 MED ORDER — PITAVASTATIN MAGNESIUM 2 MG PO TABS: 2.0000 mg | ORAL_TABLET | Freq: Every day | ORAL | 3 refills | Status: DC

## 2024-07-01 NOTE — Patient Instructions (Signed)
 Medication Instructions:  Your physician has recommended you make the following change in your medication:   Start: Pitavastatin  magnesium  2mg  daily ( we sent this into [facility])    Labwork: Fasting Lipid Panel in 3 months    Follow-Up: Follow up as needed    Special Instructions:

## 2024-07-01 NOTE — Progress Notes (Signed)
 LIPID CLINIC CONSULT NOTE  Chief Complaint:  Follow-up dyslipidemia  Primary Care Physician: Geofm Glade PARAS, MD  Primary Cardiologist:  None  HPI:  Gregory Santiago is a 73 y.o. male who is being seen today for the evaluation of dyslipidemia at the request of Dr. Monetta.  This is a pleasant 73 year old male kindly referred for evaluation management of dyslipidemia.  He has been a patient of Dr. Monetta and recently was referred to the lipid clinic.  He was seen by Josette Mink, PharmD, due to the fact that a recent noncardiac CT scan of the chest demonstrated multivessel coronary calcification.  He underwent nuclear stress testing for this which was negative for ischemia.  It was recommended that he start on a potency statin therapy and was placed on rosuvastatin  20 mg daily.  He was previously on a atorvastatin 20 mg daily.  Labs prior to this showed an LDL of 100.  Additionally he was noted to have an elevated LP(a) of 261.  Based on this he was referred for consideration of PCSK9 inhibitor therapy.  It was not felt that he may qualify for PCSK9 based on coronary artery calcification alone.  He subsequently underwent repeat lipid testing 1 month ago which actually showed small increase in his LDL cholesterol from 100-110.  He reports compliance with the medication.  Interestingly, he is LP (a) also doubled up to 504.  He does report heart disease in his family including his father.  Recently he has been undergoing treatment for salivary gland cancer at [facility] and recently had facial reconstructive surgery.  05/05/2020  Gregory Santiago is seen today for follow-up.  He is responded well to Repatha .  Total cholesterol was 175 now down to 86.  Triglycerides decreased from 1 21-68.  HDL stable at 43 and LDL decreased from 110 down to 28.  His LP(a) had been significantly elevated.  A coronary calcium  score was performed which is demonstrating significant multivessel coronary calcium  with a score  of 1603.  He did have nuclear stress testing last year which was negative for ischemia as well as an echo which showed normal heart function however there was some mitral annular and aortic valve calcification, not unexpected with elevated LP(a).  07/01/2024  Gregory Santiago is seen today in follow-up.  I last saw him back in 2021 at which time he was started on Repatha .  He is done well with that in addition to rosuvastatin .  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  Unfortunately, there is drug-drug interaction with rosuvastatin , I suspect it may potentiate the levels of the chemotherapy agent.  He has had what appears to be normal liver enzymes.  He was advised that the only statin option would be Livalo  and was prescribed 2 mg daily however the cost of the medication was over $150 a month since it was not covered by his insurance.  He remains on the Repatha .  Recent lipids show total cholesterol 157, triglycerides 116, HDL 55 and LDL 81.  He had a good response as far as reduction in LP(a) from 261 nmol/L down to 214.2.  He continues to be asymptomatic from cardiac standpoint he follows with Dr. Monetta.  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   HLD (hyperlipidemia)    on meds   Knee pain 04/23/2018   Oropharyngeal dysphagia 06/02/2019   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   Added automatically from request for surgery [id number]   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   Surgery 11/07/18 - Dr Gilmer- radical parotidectomy with facial nerve sacrifice, neck dissection and dissection/resection of the nerve through the mastoid and tympanic segments   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/02/2019   Stye 05/22/2019    Past Surgical History:  Procedure Laterality Date   arm surgery Left 1996   left forearm, ligaments, nerve and tendon repair, from accident   CHEST SURGERY  1996   from accident-wound closure   EYE SURGERY  11/2018   eye lid repair x 3    HAND SURGERY Right 1996   middle, ring, and little finger filayed open in accident   MENISCUS REPAIR Right 2018   NASAL SEPTUM SURGERY  1979   SALIVARY GLAND SURGERY Right    Nov 2019   WISDOM TOOTH EXTRACTION      FAMHx:  Family History  Problem Relation Age of Onset   Prostate cancer Father 69   Arthritis Father    Lymphoma Father 52   Colon polyps Mother 97   Diabetes Mother    Parkinson's disease Mother    Arthritis Mother    Arthritis Paternal Grandmother    Heart attack Paternal Grandfather    Heart disease Paternal Grandfather    Colon cancer Neg Hx    Esophageal cancer Neg Hx    Stomach cancer Neg Hx    Rectal cancer Neg Hx     SOCHx:   reports that he quit smoking about 41 years ago. His smoking use included cigarettes. He started smoking about 46 years ago. He has a 2.5 pack-year smoking history. He has never used smokeless tobacco. He reports that he does not drink alcohol . No history on file for drug use.  ALLERGIES:  No Known Allergies  ROS: Pertinent items noted in HPI and remainder of comprehensive ROS otherwise negative.  HOME MEDS: Current Outpatient Medications on File Prior to Visit  Medication Sig Dispense Refill    acetaminophen  (TYLENOL ) 500 MG tablet Take 1,000 mg by mouth every 6 (six) hours as needed for mild pain, moderate pain, fever or headache.     alfuzosin (UROXATRAL) 10 MG 24 hr tablet Take by mouth.     Carboxymethylcellulose Sod PF (REFRESH CELLUVISC) 1 % GEL Apply 1 Dose to eye at bedtime.     cyclobenzaprine  (FLEXERIL ) 5 MG tablet TAKE 1 TABLET THREE TIMES A DAY AS NEEDED FOR MUSCLE SPASMS 90 tablet 3   DAROLUTAMIDE PO Take 600 mg by mouth daily.     Evolocumab  (REPATHA  SURECLICK) 140 MG/ML SOAJ Inject 140 mg into the skin every 14 (fourteen) days. 6 mL 0   fluticasone (FLONASE) 50 MCG/ACT nasal spray Place  2 sprays into both nostrils at bedtime.     hydrocortisone  (ANUSOL -HC) 2.5 % rectal cream Place 1 Application rectally 2 (two) times daily as needed for hemorrhoids or anal itching. 30 g 5   ibuprofen (ADVIL) 200 MG tablet Take by mouth.     meloxicam  (MOBIC ) 15 MG tablet Take 1 tablet (15 mg total) by mouth daily. Take with food. 90 tablet 3   Multiple Vitamin (MULTIVITAMIN) tablet Take 1 tablet by mouth daily.     omeprazole  (PRILOSEC) 40 MG capsule TAKE 1 CAPSULE DAILY 90 capsule 3   Polyvinyl Alcohol -Povidone PF (REFRESH) 1.4-0.6 % SOLN Apply 2 drops to eye daily as needed (right eye dryness, unable to close right eye).     sodium fluoride  (FLUORISHIELD) 1.1 % GEL dental gel Place 1 Application onto teeth at bedtime.     gabapentin  (NEURONTIN ) 300 MG capsule Take by mouth. (Patient not taking: Reported on 07/01/2024)     No current facility-administered medications on file prior to visit.    LABS/IMAGING: No results found for this or any previous visit (from the past 48 hours). No results found.  LIPID PANEL:    Component Value Date/Time   CHOL 157 03/31/2024 0810   TRIG 116 03/31/2024 0810   HDL 55 03/31/2024 0810   CHOLHDL 2.9 03/31/2024 0810   CHOLHDL 2 08/07/2021 0834   VLDL 20.6 08/07/2021 0834   LDLCALC 81 03/31/2024 0810   LDLDIRECT 155.9 01/29/2014 1028     WEIGHTS: Wt Readings from Last 3 Encounters:  07/01/24 186 lb (84.4 kg)  10/24/23 179 lb 9.6 oz (81.5 kg)  08/06/23 181 lb (82.1 kg)    VITALS: BP 128/84 (BP Location: Left Arm, Patient Position: Sitting, Cuff Size: Normal)   Pulse 94   Ht 5' 9 (1.753 m)   Wt 186 lb (84.4 kg)   SpO2 98%   BMI 27.47 kg/m   EXAM: Deferred  EKG: Deferred  ASSESSMENT: Mixed dyslipidemia goal LDL less than 70 Multivessel coronary calcification Family history of heart disease High LP(a)-504, improved down to 214.2 nmol/L  PLAN: 1.   Mr. Boomhower has had a good response to Repatha  and rosuvastatin  however had to be taken off of rosuvastatin  by [facility] due to interaction with his chemotherapy drugs.  It was felt that he could take Livalo .  However this is cost prohibitive to him over $150 a month.  We discussed an alternative today which would be Zyptimag (pitavastatin  magnesium ), which was a generic alternative with a different binder but is still the same statin medication.  This is available directly from [facility] is a mail order for $34 a month.  The seem more reasonable to him and I think it would be a good idea to add this.  Statins do provide pleiotropic effects including antioxidant and plaque stabilizing benefits.  These are not present with the Repatha .  He is agreeable to start 2 mg daily.  Will plan repeat lipids in about 3 months again targeting his LDL to less than 70.  He can likely follow-up with Dr. Monetta afterwards.  Thanks again for the kind referral.  Vinie KYM Maxcy, MD, [facility], FNLA, FACP  [place]  [facility]  Medical Director of the [facility] Lipid Disorders &  Cardiovascular Risk Reduction Clinic Diplomate of the American Board of Clinical Lipidology Attending Cardiologist  Direct Dial: [phone]  Fax: [phone]  Website:  [email or web address]   Vinie BROCKS Tiago Humphrey 07/01/2024, 8:29 AM

## 2024-07-06 ENCOUNTER — Encounter (HOSPITAL_BASED_OUTPATIENT_CLINIC_OR_DEPARTMENT_OTHER): Payer: Self-pay | Admitting: Internal Medicine

## 2024-07-06 ENCOUNTER — Other Ambulatory Visit (HOSPITAL_BASED_OUTPATIENT_CLINIC_OR_DEPARTMENT_OTHER): Payer: Self-pay | Admitting: *Deleted

## 2024-07-06 MED ORDER — PITAVASTATIN MAGNESIUM 2 MG PO TABS: 2.0000 mg | ORAL_TABLET | Freq: Every day | ORAL | 3 refills | Status: DC

## 2024-08-04 ENCOUNTER — Encounter: Payer: Self-pay | Admitting: Internal Medicine

## 2024-08-04 NOTE — Progress Notes (Signed)
 Subjective:    Patient ID: Gregory Santiago, male    DOB: [birth year], 73 y.o.   MRN: [id number]     HPI Gregory Santiago is here for follow up of his chronic medical problems.  Getting lupron injection for prostate cancer with his local oncologist.  Goes to [facility] once a month.  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  He does have some hot flashes on occasion, but otherwise is tolerating his medications.  Chronic back pain - dr poole - intermittent injections.    Walking some for exercise  Medications and allergies reviewed with patient and updated if appropriate.  Current Outpatient Medications on File Prior to Visit  Medication Sig Dispense Refill   acetaminophen  (TYLENOL ) 500 MG tablet Take 1,000 mg by mouth every 6 (six) hours as needed for mild pain, moderate pain, fever or headache.     alfuzosin (UROXATRAL) 10 MG 24 hr tablet Take by mouth.     Carboxymethylcellulose Sod PF (REFRESH CELLUVISC) 1 % GEL Apply 1 Dose to eye at bedtime.     cyclobenzaprine  (FLEXERIL ) 5 MG tablet TAKE 1 TABLET THREE TIMES A DAY AS NEEDED FOR MUSCLE SPASMS 90 tablet 3   DAROLUTAMIDE PO Take 600 mg by mouth daily.     Docusate Sodium (DSS) 100 MG CAPS Take 200 mg by mouth.     Evolocumab  (REPATHA  SURECLICK) 140 MG/ML SOAJ Inject 140 mg into the skin every 14 (fourteen) days. 6 mL 0   fluticasone (FLONASE) 50 MCG/ACT nasal spray Place 2 sprays into both nostrils at bedtime.     hydrocortisone  (ANUSOL -HC) 2.5 % rectal cream Place 1 Application rectally 2 (two) times daily as needed for hemorrhoids or anal itching. 30 g 5   ibuprofen (ADVIL) 200 MG tablet Take by mouth.     meloxicam  (MOBIC ) 15 MG tablet Take 1 tablet (15 mg total) by mouth daily. Take with food. 90 tablet 3   Multiple Vitamin (MULTIVITAMIN) tablet Take 1 tablet by mouth daily.     omeprazole  (PRILOSEC) 40 MG capsule TAKE 1 CAPSULE DAILY 90 capsule 3   Pitavastatin  Calcium  2 MG TABS Take 1  tablet by mouth daily.     Polyvinyl Alcohol -Povidone PF (REFRESH) 1.4-0.6 % SOLN Apply 2 drops to eye daily as needed (right eye dryness, unable to close right eye).     sodium fluoride  (FLUORISHIELD) 1.1 % GEL dental gel Place 1 Application onto teeth at bedtime.     Vitamin D , Cholecalciferol, 10 MCG (400 UNIT) TABS Take by mouth.     Pitavastatin  Magnesium  2 MG TABS Take 2 mg by mouth daily. (Patient not taking: Reported on 08/05/2024) 90 tablet 3   No current facility-administered medications on file prior to visit.     Review of Systems  Constitutional:  Negative for fever.       Hotflashes   Respiratory:  Positive for shortness of breath (with strenuous exertion). Negative for cough and wheezing.   Cardiovascular:  Negative for chest pain, palpitations and leg swelling.  Neurological:  Positive for light-headedness (occ with getting up quick) and headaches.       Objective:   Vitals:   08/05/24 0859  BP: 122/70  Pulse: 65  Temp: 98.3 F (36.8 C)  SpO2: 98%   BP Readings from Last 3 Encounters:  08/05/24 122/70  07/01/24 128/84  03/30/24 (!) 163/90   Wt Readings from Last 3 Encounters:  08/05/24 184 lb (83.5 kg)  07/01/24 186 lb (84.4 kg)  10/24/23 179 lb 9.6 oz (81.5 kg)   Body mass index is 27.17 kg/m.    Physical Exam Constitutional:      General: He is not in acute distress.    Appearance: Normal appearance. He is not ill-appearing.  HENT:     Head: Normocephalic and atraumatic.  Eyes:     Conjunctiva/sclera: Conjunctivae normal.  Cardiovascular:     Rate and Rhythm: Normal rate and regular rhythm.     Heart sounds: Normal heart sounds.  Pulmonary:     Effort: Pulmonary effort is normal. No respiratory distress.     Breath sounds: Normal breath sounds. No wheezing or rales.  Musculoskeletal:     Right lower leg: No edema.     Left lower leg: No edema.  Skin:    General: Skin is warm and dry.     Findings: No rash.  Neurological:     Mental  Status: He is alert. Mental status is at baseline.  Psychiatric:        Mood and Affect: Mood normal.        Lab Results  Component Value Date   WBC 5.2 07/24/2022   HGB 13.7 07/24/2022   HCT 39.2 07/24/2022   PLT 183 07/24/2022   GLUCOSE 102 (H) 10/24/2023   CHOL 157 03/31/2024   TRIG 116 03/31/2024   HDL 55 03/31/2024   LDLDIRECT 155.9 01/29/2014   LDLCALC 81 03/31/2024   ALT 14 03/31/2024   AST 17 03/31/2024   NA 141 10/24/2023   K 5.0 10/24/2023   CL 105 10/24/2023   CREATININE 0.69 (L) 10/24/2023   BUN 14 10/24/2023   CO2 25 10/24/2023   TSH 1.974 07/24/2022   PSA 0.02 (L) 06/03/2020   HGBA1C 5.5 08/07/2021     Assessment & Plan:    See Problem List for Assessment and Plan of chronic medical problems.

## 2024-08-04 NOTE — Patient Instructions (Addendum)
       Medications changes include :   None    A referral was ordered and someone will call you to schedule an appointment.     No follow-ups on file.

## 2024-08-05 ENCOUNTER — Ambulatory Visit (INDEPENDENT_AMBULATORY_CARE_PROVIDER_SITE_OTHER): Admitting: Internal Medicine

## 2024-08-05 VITALS — BP 122/70 | HR 65 | Temp 98.3°F | Ht 69.0 in | Wt 184.0 lb

## 2024-08-05 DIAGNOSIS — M150 Primary generalized (osteo)arthritis: Secondary | ICD-10-CM

## 2024-08-05 DIAGNOSIS — K219 Gastro-esophageal reflux disease without esophagitis: Secondary | ICD-10-CM | POA: Diagnosis not present

## 2024-08-05 DIAGNOSIS — M62838 Other muscle spasm: Secondary | ICD-10-CM | POA: Diagnosis not present

## 2024-08-05 DIAGNOSIS — C7951 Secondary malignant neoplasm of bone: Secondary | ICD-10-CM

## 2024-08-05 DIAGNOSIS — C089 Malignant neoplasm of major salivary gland, unspecified: Secondary | ICD-10-CM

## 2024-08-05 MED ORDER — CYCLOBENZAPRINE HCL 5 MG PO TABS: ORAL_TABLET | ORAL | 3 refills | Status: AC

## 2024-08-05 MED ORDER — OMEPRAZOLE 40 MG PO CPDR: DELAYED_RELEASE_CAPSULE | ORAL | 3 refills | Status: AC

## 2024-08-05 MED ORDER — MELOXICAM 15 MG PO TABS: 15.0000 mg | ORAL_TABLET | Freq: Every day | ORAL | 3 refills | Status: AC

## 2024-08-05 NOTE — Assessment & Plan Note (Addendum)
 Metastatic salivary ductal carcinoma Going to [facility] in a clinical trial-receiving Darolutamide with Leuprolide Acetate Medication seems to be working and he is tolerating it well

## 2024-08-05 NOTE — Assessment & Plan Note (Signed)
 Chronic History of peptic ulcer disease GERD controlled Continue omeprazole  40 mg daily

## 2024-08-05 NOTE — Assessment & Plan Note (Signed)
 Chronic Taking meloxicam  15 mg daily Tylenol  as needed Recent GFR normal

## 2024-08-05 NOTE — Assessment & Plan Note (Addendum)
 Chronic Intermittent Continue flexeril  5 mg daily as needed - does not take often

## 2024-10-12 ENCOUNTER — Ambulatory Visit (HOSPITAL_COMMUNITY): Payer: Self-pay

## 2024-10-12 ENCOUNTER — Ambulatory Visit

## 2024-10-12 ENCOUNTER — Ambulatory Visit
Admission: RE | Admit: 2024-10-12 | Discharge: 2024-10-12 | Disposition: A | Discharge status: Home / Self Care | Attending: Family Medicine | Admitting: Family Medicine

## 2024-10-12 VITALS — BP 134/90 | HR 92 | Temp 98.1°F | Resp 17

## 2024-10-12 DIAGNOSIS — D701 Agranulocytosis secondary to cancer chemotherapy: Secondary | ICD-10-CM

## 2024-10-12 DIAGNOSIS — T451X5A Adverse effect of antineoplastic and immunosuppressive drugs, initial encounter: Secondary | ICD-10-CM

## 2024-10-12 DIAGNOSIS — R051 Acute cough: Secondary | ICD-10-CM

## 2024-10-12 DIAGNOSIS — J069 Acute upper respiratory infection, unspecified: Secondary | ICD-10-CM | POA: Diagnosis not present

## 2024-10-12 LAB — POC SOFIA SARS ANTIGEN FIA: SARS Coronavirus 2 Ag: NEGATIVE

## 2024-10-12 MED ORDER — AMOXICILLIN-POT CLAVULANATE 875-125 MG PO TABS: 1.0000 | ORAL_TABLET | Freq: Two times a day (BID) | ORAL | 0 refills | Status: DC

## 2024-10-12 NOTE — ED Provider Notes (Signed)
 Gregory Santiago    CSN: [id number] Arrival date & time: 10/12/24  1313      History   Chief Complaint Chief Complaint  Patient presents with   Cough    Entered by patient    HPI Gregory Santiago is a 73 y.o. male.   HPI  Patient states he just traveled back from [facility] in [place] , and the next day woke up with a cough.  He has a sore throat, cough, some congestion.  He did have a fever on the first day.  Symptoms for 3 days now day 4 of illness.  He has been taken over-the-counter medicines.  He has had flu and COVID shots 3 weeks ago.  He is under treatment for recurrent metastatic cancer and his current white count (last week) was 2.7.  He understands that with neutropenia he is at increased risk for infection.  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   HLD (hyperlipidemia)    on meds   Knee pain 04/23/2018   Oropharyngeal dysphagia 06/02/2019   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   Added automatically from request for surgery [id number]   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   Surgery 11/07/18 - Dr Gilmer- radical parotidectomy with facial nerve sacrifice, neck dissection and dissection/resection of the nerve through the mastoid and tympanic segments    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/02/2019   Stye 05/22/2019    Patient Active Problem List   Diagnosis Date Noted   Metastatic carcinoma to bone (HCC) 08/06/2023   Malignant neoplasm of major salivary gland, unspecified (HCC) 05/29/2023   Neoplasm related pain (acute) (chronic) 05/29/2023   Electrocution 07/24/2022   Bladder irritability 08/07/2021   Scar of skin 03/17/2021   History of salivary gland cancer 03/10/2021   Left hip pain 02/25/2021   Bilateral hearing loss 02/23/2021   Ulcerative colitis (HCC) 02/23/2021   Hemorrhoids    Fundic gland polyps of stomach, benign    Duodenitis    Cranial nerve VII palsy    Allergic rhinitis    Left lumbar radiculopathy 12/28/2020   Sudden left hearing loss 06/03/2020   Osteoarthritis, generalized 06/03/2020   Muscle spasms of neck 06/03/2020   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   Oropharyngeal dysphagia 06/02/2019   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/02/2019   Coronary artery calcification seen on CAT scan 05/22/2019   Family history of diabetes mellitus in mother 05/21/2019   Ear pressure, right 02/27/2019   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   Facial weakness 09/19/2018   Herpes  simplex antibody positive 08/14/2018   Knee pain 04/23/2018   GERD (gastroesophageal reflux disease)    HLD (hyperlipidemia)    Blood transfusion without reported diagnosis 1996    Past Surgical History:  Procedure Laterality Date   arm surgery Left 1996   left forearm, ligaments, nerve and tendon repair, from accident   CHEST SURGERY  1996   from accident-wound closure   EYE SURGERY  11/2018   eye lid repair x 3    HAND SURGERY Right 1996   middle, ring, and little finger filayed open in accident   MENISCUS REPAIR Right 2018   NASAL SEPTUM SURGERY  1979   SALIVARY GLAND  SURGERY Right    Nov 2019   WISDOM TOOTH EXTRACTION         Home Medications    Prior to Admission medications   Medication Sig Start Date End Date Taking? Authorizing Provider  amoxicillin-clavulanate (AUGMENTIN) 875-125 MG tablet Take 1 tablet by mouth every 12 (twelve) hours. 10/12/24  Yes Maranda Jamee Jacob, MD  acetaminophen  (TYLENOL ) 500 MG tablet Take 1,000 mg by mouth every 6 (six) hours as needed for mild pain, moderate pain, fever or headache.    [provider]  alfuzosin (UROXATRAL) 10 MG 24 hr tablet Take by mouth. 12/24/22   [provider]  Carboxymethylcellulose Sod PF (REFRESH CELLUVISC) 1 % GEL Apply 1 Dose to eye at bedtime.    [provider]  cyclobenzaprine  (FLEXERIL ) 5 MG tablet TAKE 1 TABLET THREE TIMES A DAY AS NEEDED FOR MUSCLE SPASMS 08/05/24   Geofm Glade PARAS, MD  DAROLUTAMIDE PO Take 600 mg by mouth daily. 06/17/24   [provider]  Docusate Sodium (DSS) 100 MG CAPS Take 200 mg by mouth.    [provider]  Evolocumab  (REPATHA  SURECLICK) 140 MG/ML SOAJ Inject 140 mg into the skin every 14 (fourteen) days. 05/22/24   Monetta Redell PARAS, MD  fluticasone (FLONASE) 50 MCG/ACT nasal spray Place 2 sprays into both nostrils at bedtime.    [provider]  hydrocortisone  (ANUSOL -HC) 2.5 % rectal cream Place 1 Application rectally 2 (two) times daily as needed for hemorrhoids or anal itching. 07/24/22   Geofm Glade PARAS, MD  ibuprofen (ADVIL) 200 MG tablet Take by mouth.    [provider]  meloxicam  (MOBIC ) 15 MG tablet Take 1 tablet (15 mg total) by mouth daily. Take with food. 08/05/24   Geofm Glade PARAS, MD  Multiple Vitamin (MULTIVITAMIN) tablet Take 1 tablet by mouth daily.    [provider]  omeprazole  (PRILOSEC) 40 MG capsule TAKE 1 CAPSULE DAILY 08/05/24   Geofm Glade PARAS, MD  Pitavastatin  Calcium  2 MG TABS Take 1 tablet by mouth daily.    [provider]  Pitavastatin  Magnesium  2 MG TABS Take 2  mg by mouth daily. Patient not taking: Reported on 08/05/2024 07/06/24   Mona Vinie BROCKS, MD  Polyvinyl Alcohol -Povidone PF (REFRESH) 1.4-0.6 % SOLN Apply 2 drops to eye daily as needed (right eye dryness, unable to close right eye).    [provider]  sodium fluoride  (FLUORISHIELD) 1.1 % GEL dental gel Place 1 Application onto teeth at bedtime. 04/21/20   [provider]  Vitamin D , Cholecalciferol, 10 MCG (400 UNIT) TABS Take by mouth.    [provider]    Family History Family History  Problem Relation Age of Onset   Prostate cancer Father 85   Arthritis Father    Lymphoma Father 65   Colon  polyps Mother 19   Diabetes Mother    Parkinson's disease Mother    Arthritis Mother    Arthritis Paternal Grandmother    Heart attack Paternal Grandfather    Heart disease Paternal Grandfather    Colon cancer Neg Hx    Esophageal cancer Neg Hx    Stomach cancer Neg Hx    Rectal cancer Neg Hx     Social History Social History   Tobacco Use   Smoking status: Former    Current packs/day: 0.00    Average packs/day: 0.5 packs/day for 5.0 years (2.5 ttl pk-yrs)    Types: Cigarettes    Start date: 12/24/1977    Quit date: 12/24/1982    Years since quitting: 41.8   Smokeless tobacco: Never  Vaping Use   Vaping status: Never Used  Substance Use Topics   Alcohol  use: Never    Comment: 1 x per month     Allergies   Patient has no known allergies.   Review of Systems Review of Systems  See HPI Physical Exam Triage Vital Signs ED Triage Vitals  Encounter Vitals Group     BP 10/12/24 1319 (!) 134/90     Girls Systolic BP Percentile --      Girls Diastolic BP Percentile --      Boys Systolic BP Percentile --      Boys Diastolic BP Percentile --      Pulse Rate 10/12/24 1319 92     Resp 10/12/24 1319 17     Temp 10/12/24 1319 98.1 F (36.7 C)     Temp Source 10/12/24 1319 Oral     SpO2 10/12/24 1319 97 %     Weight --      Height --      Head  Circumference --      Peak Flow --      Pain Score 10/12/24 1321 0     Pain Loc --      Pain Education --      Exclude from Growth Chart --    No data found.  Updated Vital Signs BP (!) 134/90 (BP Location: Right Arm)   Pulse 92   Temp 98.1 F (36.7 C) (Oral)   Resp 17   SpO2 97%       Physical Exam Vitals reviewed.  Constitutional:      General: He is not in acute distress.    Appearance: He is normal weight. He is ill-appearing.     Comments: Facial asymmetry from surgeries/nerve paralysis  HENT:     Head: Normocephalic.     Ears:     Comments: Hearing aids in place    Nose: Nose normal. No congestion.     Mouth/Throat:     Pharynx: Posterior oropharyngeal erythema present.     Comments: Many absent teeth.  Postoperative changes.  Mild erythema posterior pharynx Cardiovascular:     Rate and Rhythm: Regular rhythm.     Heart sounds: Normal heart sounds.  Pulmonary:     Effort: No respiratory distress.     Breath sounds: Rhonchi present.  Lymphadenopathy:     Cervical: No cervical adenopathy.  Neurological:     Mental Status: He is alert.      UC Treatments / Results  Labs (all labs ordered are listed, but only abnormal results are displayed) Labs Reviewed  POC SOFIA SARS ANTIGEN FIA    EKG   Radiology DG Chest 2 View Result Date: 10/12/2024 CLINICAL DATA:  cancer  patient, neutropenic, cough and fever Cough for 1 week. EXAM: DG CHEST 2V COMPARISON:  Radiographs 07/24/2022 and 03/12/2016. Outside chest CT 07/17/2023. FINDINGS: The heart size and mediastinal contours are stable. There is mild linear scarring at the left lung base. No confluent airspace disease, edema, pleural effusion or pneumothorax. A surgical clip is noted in the lower right neck. The bones appear unremarkable. IMPRESSION: No evidence of acute cardiopulmonary process. Mild left basilar scarring. Electronically Signed   By: Elsie Perone M.D.   On: 10/12/2024 14:51     Procedures Procedures (including critical Santiago time)  Medications Ordered in UC Medications - No data to display  Initial Impression / Assessment and Plan / UC Course  I have reviewed the triage vital signs and the nursing notes.  Pertinent labs & imaging results that were available during my Santiago of the patient were reviewed by me and considered in my medical decision making (see chart for details).     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  He states his doctors usually give him Augmentin with good results.  He knows to go to emergency room if he becomes worse instead of better Final Clinical Impressions(s) / UC Diagnoses   Final diagnoses:  Acute cough  Acute upper respiratory infection  Chemotherapy-induced neutropenia     Discharge Instructions      Make sure you are drinking lots of fluids Take the Augmentin 2 times a day Consider adding a probiotic while you are on Augmentin May continue over-the-counter cough medicine such as Mucinex DM     ED Prescriptions     Medication Sig Dispense Auth. Provider   amoxicillin-clavulanate (AUGMENTIN) 875-125 MG tablet Take 1 tablet by mouth every 12 (twelve) hours. 14 tablet Maranda Jamee Jacob, MD      PDMP not reviewed this encounter.   Maranda Jamee Jacob, MD 10/12/24 [phone]

## 2024-10-12 NOTE — ED Triage Notes (Signed)
 Pt c/o cough and congestion since Thurs. Fever only on first day of sxs. COVID and flu shots 3 weeks ago. Mucinex and tylenol  prn.

## 2024-10-12 NOTE — Discharge Instructions (Signed)
 Make sure you are drinking lots of fluids Take the Augmentin 2 times a day Consider adding a probiotic while you are on Augmentin May continue over-the-counter cough medicine such as Mucinex DM

## 2024-11-04 ENCOUNTER — Other Ambulatory Visit: Payer: Self-pay

## 2024-11-04 ENCOUNTER — Other Ambulatory Visit: Payer: Self-pay | Admitting: Cardiology

## 2024-11-04 DIAGNOSIS — L821 Other seborrheic keratosis: Secondary | ICD-10-CM | POA: Insufficient documentation

## 2024-11-04 DIAGNOSIS — I2510 Atherosclerotic heart disease of native coronary artery without angina pectoris: Secondary | ICD-10-CM

## 2024-11-04 DIAGNOSIS — E7841 Elevated Lipoprotein(a): Secondary | ICD-10-CM

## 2024-11-04 DIAGNOSIS — R931 Abnormal findings on diagnostic imaging of heart and coronary circulation: Secondary | ICD-10-CM

## 2024-11-04 DIAGNOSIS — I6523 Occlusion and stenosis of bilateral carotid arteries: Secondary | ICD-10-CM

## 2024-11-04 DIAGNOSIS — K130 Diseases of lips: Secondary | ICD-10-CM | POA: Insufficient documentation

## 2024-11-05 ENCOUNTER — Encounter: Payer: Self-pay | Admitting: Cardiology

## 2024-11-05 ENCOUNTER — Ambulatory Visit: Attending: Cardiology | Admitting: Cardiology

## 2024-11-05 VITALS — BP 128/78 | HR 70 | Ht 69.0 in | Wt 191.4 lb

## 2024-11-05 DIAGNOSIS — E7841 Elevated Lipoprotein(a): Secondary | ICD-10-CM | POA: Diagnosis present

## 2024-11-05 DIAGNOSIS — I2510 Atherosclerotic heart disease of native coronary artery without angina pectoris: Secondary | ICD-10-CM | POA: Diagnosis present

## 2024-11-05 DIAGNOSIS — R931 Abnormal findings on diagnostic imaging of heart and coronary circulation: Secondary | ICD-10-CM | POA: Insufficient documentation

## 2024-11-05 DIAGNOSIS — I6523 Occlusion and stenosis of bilateral carotid arteries: Secondary | ICD-10-CM | POA: Diagnosis not present

## 2024-11-05 MED ORDER — REPATHA SURECLICK 140 MG/ML ~~LOC~~ SOAJ: 140.0000 mg | SUBCUTANEOUS | 0 refills | Status: DC

## 2024-11-05 MED ORDER — PITAVASTATIN CALCIUM 2 MG PO TABS: 1.0000 | ORAL_TABLET | Freq: Every day | ORAL | 3 refills | Status: AC

## 2024-11-05 NOTE — Patient Instructions (Addendum)
 Medication Instructions:  Your physician recommends that you continue on your current medications as directed. Please refer to the Current Medication list given to you today.  *If you need a refill on your cardiac medications before your next appointment, please call your pharmacy*   Lab Work: Your physician recommends that you have a lipids and ApoB today in the office.  If you have labs (blood work) drawn today and your tests are completely normal, you will receive your results only by: MyChart Message (if you have MyChart) OR A paper copy in the mail If you have any lab test that is abnormal or we need to change your treatment, we will call you to review the results.   Testing/Procedures: Your physician has requested that you have a carotid duplex. This test is an ultrasound of the carotid arteries in your neck. It looks at blood flow through these arteries that supply the brain with blood. Allow one hour for this exam. There are no restrictions or special instructions.    Follow-Up: At [facility], you and your health needs are our priority.  As part of our continuing mission to provide you with exceptional heart care, we have created designated Provider Care Teams.  These Care Teams include your primary Cardiologist (physician) and Advanced Practice Providers (APPs -  Physician Assistants and Nurse Practitioners) who all work together to provide you with the care you need, when you need it.  We recommend signing up for the patient portal called MyChart.  Sign up information is provided on this After Visit Summary.  MyChart is used to connect with patients for Virtual Visits (Telemedicine).  Patients are able to view lab/test results, encounter notes, upcoming appointments, etc.  Non-urgent messages can be sent to your provider as well.   To learn more about what you can do with MyChart, go to [email or web address].    Your next appointment:   12 month(s)  The format  for your next appointment:   In Person  Provider:   Redell Leiter, MD    Other Instructions none  Important Information About Sugar

## 2024-11-05 NOTE — Progress Notes (Signed)
 Cardiology Office Note:    Date:  11/05/2024   ID:  [name], DOB [birth year], MRN [id number]  PCP:  Geofm Glade PARAS, MD  Cardiologist:  Redell Leiter, MD    Referring MD: Geofm Glade PARAS, MD    ASSESSMENT:    1. Atherosclerosis of both carotid arteries   2. Coronary artery calcification seen on CAT scan   3. Agatston coronary artery calcium  score greater than 400   4. Elevated lipoprotein(a)    PLAN:    In order of problems listed above:  Continue current medical therapy including Repatha  and low-dose PET-avid statin with ideal lipids  recheck carotid duplex with risk factor of repeated radiation therapy   Next appointment: 1 year   Medication Adjustments/Labs and Tests Ordered: Current medicines are reviewed at length with the patient today.  Concerns regarding medicines are outlined above.  Orders Placed This Encounter  Procedures   Lipid Panel+ApoB   EKG 12-Lead   VAS US  CAROTID   Meds ordered this encounter  Medications   Evolocumab  (REPATHA  SURECLICK) 140 MG/ML SOAJ    Sig: Inject 140 mg into the skin every 14 (fourteen) days.    Dispense:  6 mL    Refill:  0   Pitavastatin  Calcium  2 MG TABS    Sig: Take 1 tablet (2 mg total) by mouth daily.    Dispense:  90 tablet    Refill:  3     History of Present Illness:    Gregory Santiago is a 73 y.o. male with a hx of coronary calcification with a severely elevated calcium  score 94th percentile hyperlipidemia and parotid cancer with investigational treatment through [facility] in [place]  last seen 10/24/2023. Compliance with diet, lifestyle and medications: Yes  Gregory Santiago is very upbeat feels very good about his treatment and tells me there is objective response with imaging He is not having angina edema shortness of breath palpitation or syncope He is on appropriate lipid-lowering treatment not to interfere with oncologic therapy At risk for carotid stenosis will recheck his duplex with extensive radiation therapy Past  Medical History:  Diagnosis Date   Allergic rhinitis    Allergy    seasonal allergies   Angular cheilitis 11/04/2024   AR (allergic rhinitis) 10/28/2018   Arthritis    generalized   Bilateral hearing loss 02/23/2021   Bladder irritability 08/07/2021   Blood transfusion without reported diagnosis 1996   Coronary artery calcification seen on CAT scan 05/22/2019   Cranial nerve VII palsy    Duodenitis    Ear pressure, right 02/27/2019   Electrocution 07/24/2022   Facial weakness 09/19/2018   Family history of diabetes mellitus in mother 05/21/2019   Fundic gland polyps of stomach, benign    GERD (gastroesophageal reflux disease)    on meds   Hemorrhoids    Hepatitis A    as a child   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 Straightening of the expected lumbar lordosis, nonspecific though  could be seen in the setting of muscle spasm.  2. Severe DDD of L4-L5.  3. Mild-to-moderate DDD within the remainder of the lumbar spine.     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   alfuzosin (UROXATRAL) 10 MG 24 hr tablet Take by mouth.   Carboxymethylcellulose Sod PF (REFRESH CELLUVISC) 1 % GEL Apply 1 Dose to eye at bedtime.   cyclobenzaprine  (FLEXERIL ) 5 MG tablet TAKE 1 TABLET THREE TIMES A DAY AS NEEDED FOR MUSCLE SPASMS   DAROLUTAMIDE PO Take 600 mg by mouth daily.   Docusate Sodium (DSS) 100 MG CAPS Take 200 mg by mouth.   fluticasone (FLONASE) 50 MCG/ACT nasal spray Place 2 sprays into both nostrils at bedtime.   hydrocortisone  (ANUSOL -HC) 2.5 % rectal cream Place 1 Application rectally 2 (two) times daily as needed for hemorrhoids or anal itching.   ibuprofen (ADVIL) 200 MG tablet Take by mouth.   meloxicam  (MOBIC ) 15 MG tablet Take 1 tablet (15 mg total) by mouth daily. Take with food.   Multiple Vitamin (MULTIVITAMIN) tablet Take 1 tablet by mouth daily.   omeprazole  (PRILOSEC) 40 MG capsule TAKE 1 CAPSULE DAILY   Polyvinyl Alcohol -Povidone PF (REFRESH) 1.4-0.6 % SOLN Apply 2 drops to eye daily as needed (right eye dryness, unable to close right eye).   sodium fluoride  (FLUORISHIELD) 1.1 % GEL dental gel Place 1 Application onto teeth at bedtime.   Vitamin D , Cholecalciferol, 10 MCG (400 UNIT) TABS Take by mouth.   [DISCONTINUED] Pitavastatin  Calcium  2 MG TABS Take 1 tablet by mouth daily.   [DISCONTINUED] REPATHA  SURECLICK 140 MG/ML SOAJ INJECT 140MG  INTO SKIN EVERY 14 DAYS AS DIRECTED      EKGs/Labs/Other Studies Reviewed:    The following studies  were reviewed today:  Cardiac Studies & Procedures   ______________________________________________________________________________________________   STRESS TESTS  MYOCARDIAL PERFUSION IMAGING 02/15/2021  Interpretation Summary  The left ventricular ejection fraction is mildly decreased (45-54%).  Nuclear stress EF: 54%.  There was no ST segment deviation noted during stress.  The study is normal.  This is a low risk study.  No ischemia or infarction on perfusion images. Normal wall motion.   ECHOCARDIOGRAM  ECHOCARDIOGRAM COMPLETE 07/25/2022  Narrative ECHOCARDIOGRAM REPORT    Patient Name:   Bradrick [facility] Date of Exam: 07/25/2022 Medical Rec #:  [id number]   Height:       71.0 in Accession #:    [id number]  Weight:       173.6 lb Date of Birth:  April 07, 1951   BSA:          1.985 m Patient Age:  73 years    BP:           126/89 mmHg Patient Gender: M           HR:           52 bpm. Exam Location:  Inpatient  Procedure: 2D Echo, Cardiac Doppler and Color Doppler  Indications:    R01.1 Murmur  History:        Patient has prior history of Echocardiogram examinations, most recent 06/12/2019. Risk Factors:Dyslipidemia.  Sonographer:    Thea Norlander Sonographer#2:  Lauraine Pilot RDCS Referring Phys: [id number] CORT ONEIDA MANA  IMPRESSIONS   1. Left ventricular ejection fraction, by estimation, is 50%. The left ventricle has mildly decreased function. The left ventricle demonstrates global hypokinesis. Left ventricular diastolic parameters are consistent with Grade I diastolic dysfunction (impaired relaxation). 2. Right ventricular systolic function is mildly reduced. The right ventricular size is normal. There is normal pulmonary artery systolic pressure. The estimated right ventricular systolic pressure is 20.1 mmHg. 3. The mitral valve is normal in structure. No evidence of mitral valve regurgitation. No evidence of mitral stenosis. 4. The aortic valve is tricuspid.  There is mild calcification of the aortic valve. Aortic valve regurgitation is not visualized. No aortic stenosis is present. 5. The inferior vena cava is dilated in size with >50% respiratory variability, suggesting right atrial pressure of 8 mmHg.  FINDINGS Left Ventricle: Left ventricular ejection fraction, by estimation, is 50%. The left ventricle has mildly decreased function. The left ventricle demonstrates global hypokinesis. The left ventricular internal cavity size was normal in size. There is no left ventricular hypertrophy. Left ventricular diastolic parameters are consistent with Grade I diastolic dysfunction (impaired relaxation).  Right Ventricle: The right ventricular size is normal. No increase in right ventricular wall thickness. Right ventricular systolic function is mildly reduced. There is normal pulmonary artery systolic pressure. The tricuspid regurgitant velocity is 1.74 m/s, and with an assumed right atrial pressure of 8 mmHg, the estimated right ventricular systolic pressure is 20.1 mmHg.  Left Atrium: Left atrial size was normal in size.  Right Atrium: Right atrial size was normal in size.  Pericardium: There is no evidence of pericardial effusion.  Mitral Valve: The mitral valve is normal in structure. No evidence of mitral valve regurgitation. No evidence of mitral valve stenosis.  Tricuspid Valve: The tricuspid valve is normal in structure. Tricuspid valve regurgitation is trivial.  Aortic Valve: The aortic valve is tricuspid. There is mild calcification of the aortic valve. Aortic valve regurgitation is not visualized. No aortic stenosis is present.  Pulmonic Valve: The pulmonic valve was normal in structure. Pulmonic valve regurgitation is not visualized.  Aorta: The aortic root is normal in size and structure.  Venous: The inferior vena cava is dilated in size with greater than 50% respiratory variability, suggesting right atrial pressure of 8  mmHg.  IAS/Shunts: No atrial level shunt detected by color flow Doppler.   LEFT VENTRICLE PLAX 2D LVIDd:         4.20 cm LVIDs:         3.10 cm LV PW:         1.20 cm LV IVS:        0.90 cm LVOT diam:     2.10 cm LV SV:         69 LV SV Index:   35 LVOT Area:     3.46 cm  LV Volumes (MOD) LV vol d, MOD A2C: 118.0 ml LV vol d,  MOD A4C: 91.2 ml LV vol s, MOD A2C: 61.2 ml LV vol s, MOD A4C: 50.1 ml LV SV MOD A2C:     56.8 ml LV SV MOD A4C:     91.2 ml LV SV MOD BP:      50.3 ml  RIGHT VENTRICLE RV S prime:     7.89 cm/s TAPSE (M-mode): 1.5 cm  LEFT ATRIUM             Index        RIGHT ATRIUM           Index LA diam:        4.30 cm 2.17 cm/m   RA Area:     23.10 cm LA Vol (A2C):   50.5 ml 25.44 ml/m  RA Volume:   73.90 ml  37.22 ml/m LA Vol (A4C):   43.5 ml 21.91 ml/m LA Biplane Vol: 47.3 ml 23.82 ml/m AORTIC VALVE LVOT Vmax:   81.50 cm/s LVOT Vmean:  60.700 cm/s LVOT VTI:    0.200 m  AORTA Ao Root diam: 3.50 cm Ao Asc diam:  3.70 cm  TRICUSPID VALVE TR Peak grad:   12.1 mmHg TR Vmax:        174.00 cm/s  SHUNTS Systemic VTI:  0.20 m Systemic Diam: 2.10 cm  Dalton McleanMD Electronically signed by Ezra Kanner Signature Date/Time: 07/25/2022/4:48:14 PM    Final      CT SCANS  CT CARDIAC SCORING (SELF PAY ONLY) 02/18/2020  Addendum 02/18/2020  5:41 PM ADDENDUM REPORT: 02/18/2020 17:38  CLINICAL DATA:  73 yo male for risk stratification  EXAM: Coronary Calcium  Score  TECHNIQUE: The patient was scanned on a [employer] scanner. Axial non-contrast 3 mm slices were carried out through the heart. The data set was analyzed on a dedicated work station and scored using the Agatson method.  FINDINGS: Non-cardiac: See separate report from [facility].  Ascending Aorta: Normal caliber.  Aortic atherosclerosis.  Pericardium: Normal  Coronary arteries: Normal origin.  Multivessel CAC.  Aortic valve annular and leaflet  calcification.  IMPRESSION: Coronary calcium  score of 1603. This was 94th percentile for age and sex matched control.   Electronically Signed By: Vinie JAYSON Maxcy M.D. On: 02/18/2020 17:38  Narrative EXAM: OVER-READ INTERPRETATION  CT CHEST  The following report is an over-read performed by radiologist Dr. Marcey Moan of [facility] on 02/18/2020. This over-read does not include interpretation of cardiac or coronary anatomy or pathology. The coronary calcium  score interpretation by the cardiologist is attached.  COMPARISON:  None.  FINDINGS: Vascular: Aortic valve calcifications.  Mediastinum/Nodes: No enlarged mediastinal or axillary lymph nodes. Thyroid  gland, trachea, and esophagus demonstrate no significant findings.  Lungs/Pleura: Visualized lungs show no evidence of pulmonary edema, consolidation, pneumothorax, nodule or pleural fluid.  Upper Abdomen: No acute abnormality.  Musculoskeletal: No chest wall mass or suspicious bone lesions identified.  IMPRESSION: Aortic valve calcifications.  Electronically Signed: By: Marcey Moan M.D. On: 02/18/2020 16:58     ______________________________________________________________________________________________      EKG Interpretation Date/Time:  Thursday November 05 2024 10:07:43 EST Ventricular Rate:  70 PR Interval:  202 QRS Duration:  94 QT Interval:  406 QTC Calculation: 438 R Axis:   -37  Text Interpretation: Normal sinus rhythm Left axis deviation Minimal voltage criteria for LVH, may be normal variant ( R in aVL ) When compared with ECG of 24-Oct-2023 09:05, No significant change was found Confirmed by Monetta Rogue ([id number]) on 11/05/2024 10:17:00 AM  Recent Labs: 03/31/2024: ALT 14  Recent Lipid Panel    Component Value Date/Time   CHOL 157 03/31/2024 0810   TRIG 116 03/31/2024 0810   HDL 55 03/31/2024 0810   CHOLHDL 2.9 03/31/2024 0810   CHOLHDL 2 08/07/2021 0834   VLDL 20.6  08/07/2021 0834   LDLCALC 81 03/31/2024 0810   LDLDIRECT 155.9 01/29/2014 1028    Physical Exam:    VS:  BP 128/78   Pulse 70   Ht 5' 9 (1.753 m)   Wt 191 lb 6.4 oz (86.8 kg)   SpO2 97%   BMI 28.26 kg/m     Wt Readings from Last 3 Encounters:  11/05/24 191 lb 6.4 oz (86.8 kg)  08/05/24 184 lb (83.5 kg)  07/01/24 186 lb (84.4 kg)     GEN:  Well nourished, well developed in no acute distress HEENT: Normal NECK: No JVD; No carotid bruits LYMPHATICS: No lymphadenopathy CARDIAC: RRR, no murmurs, rubs, gallops RESPIRATORY:  Clear to auscultation without rales, wheezing or rhonchi  ABDOMEN: Soft, non-tender, non-distended MUSCULOSKELETAL:  No edema; No deformity  SKIN: Warm and dry NEUROLOGIC:  Alert and oriented x 3 PSYCHIATRIC:  Normal affect    Signed, Redell Leiter, MD  11/05/2024 12:06 PM    [facility]

## 2024-11-14 LAB — LIPID PANEL+APOB
Apolipoprotein B: 43 mg/dL (ref <90)
Cholesterol, Total: 107 mg/dL (ref 100–199)
HDL-C: 50 mg/dL (ref >39)
LDL-C (NIH Calc): 34 mg/dL (ref 0–99)
Non-HDL Cholesterol: 57 mg/dL (ref 0–129)
Triglycerides: 131 mg/dL (ref 0–149)

## 2024-11-16 ENCOUNTER — Ambulatory Visit: Payer: Self-pay

## 2024-11-25 ENCOUNTER — Ambulatory Visit (HOSPITAL_BASED_OUTPATIENT_CLINIC_OR_DEPARTMENT_OTHER)

## 2024-11-25 ENCOUNTER — Ambulatory Visit (HOSPITAL_COMMUNITY)
Admission: RE | Admit: 2024-11-25 | Discharge: 2024-11-25 | Disposition: A | Source: Ambulatory Visit | Attending: Cardiology | Admitting: Cardiology

## 2024-11-25 DIAGNOSIS — I6523 Occlusion and stenosis of bilateral carotid arteries: Secondary | ICD-10-CM | POA: Insufficient documentation

## 2025-01-08 ENCOUNTER — Telehealth: Payer: Self-pay

## 2025-01-08 NOTE — Telephone Encounter (Signed)
 [employer] [facility] is requesting Prior Authorization for Repatha  SureClick 140 mg/mL. Thank you.

## 2025-01-12 ENCOUNTER — Other Ambulatory Visit (HOSPITAL_COMMUNITY): Payer: Self-pay

## 2025-01-12 ENCOUNTER — Telehealth: Payer: Self-pay | Admitting: Pharmacy Technician

## 2025-01-13 ENCOUNTER — Other Ambulatory Visit (HOSPITAL_COMMUNITY): Payer: Self-pay

## 2025-01-13 NOTE — Telephone Encounter (Signed)
 Pharmacy Patient Advocate Encounter   Received notification from Physician's Office that prior authorization for repatha  is required/requested.   Insurance verification completed.   The patient is insured through [employer].   Per test claim: PA required; PA submitted to above mentioned insurance via Latent Key/confirmation #/EOC [facility] Status is pending

## 2025-01-13 NOTE — Telephone Encounter (Signed)
 Pharmacy Patient Advocate Encounter  Received notification from [facility] [employer] that Prior Authorization for repatha  has been APPROVED from 01/12/25 to 01/12/26. Ran test claim, Copay is $99.00- 3 months. This test claim was processed through [facility]- copay amounts may vary at other pharmacies due to pharmacy/plan contracts, or as the patient moves through the different stages of their insurance plan.   PA #/Case ID/Reference #: [id number]

## 2025-01-28 ENCOUNTER — Other Ambulatory Visit: Payer: Self-pay | Admitting: Internal Medicine

## 2025-01-28 DIAGNOSIS — I2510 Atherosclerotic heart disease of native coronary artery without angina pectoris: Secondary | ICD-10-CM

## 2025-01-28 DIAGNOSIS — R931 Abnormal findings on diagnostic imaging of heart and coronary circulation: Secondary | ICD-10-CM

## 2025-01-28 DIAGNOSIS — I6523 Occlusion and stenosis of bilateral carotid arteries: Secondary | ICD-10-CM

## 2025-01-28 DIAGNOSIS — E7841 Elevated Lipoprotein(a): Secondary | ICD-10-CM
